# Patient Record
Sex: Female | Born: 1975
Health system: Southern US, Community
[De-identification: ages and names within clinical notes are randomized; demographics above are authoritative.]

## PROBLEM LIST (undated history)

## (undated) DIAGNOSIS — K219 Gastro-esophageal reflux disease without esophagitis: Secondary | ICD-10-CM

## (undated) DIAGNOSIS — D649 Anemia, unspecified: Secondary | ICD-10-CM

## (undated) DIAGNOSIS — E785 Hyperlipidemia, unspecified: Secondary | ICD-10-CM

## (undated) HISTORY — PX: TUBAL LIGATION: SHX77

## (undated) HISTORY — DX: Anemia, unspecified: D64.9

## (undated) HISTORY — DX: Hyperlipidemia, unspecified: E78.5

---

## 1999-01-22 ENCOUNTER — Other Ambulatory Visit: Admission: RE | Admit: 1999-01-22 | Discharge: 1999-01-22 | Payer: Self-pay | Admitting: Obstetrics

## 1999-02-15 ENCOUNTER — Other Ambulatory Visit: Admission: RE | Admit: 1999-02-15 | Discharge: 1999-02-15 | Payer: Self-pay | Admitting: Obstetrics and Gynecology

## 1999-05-12 ENCOUNTER — Inpatient Hospital Stay (HOSPITAL_COMMUNITY): Admission: AD | Admit: 1999-05-12 | Discharge: 1999-05-12 | Payer: Self-pay | Admitting: Obstetrics & Gynecology

## 1999-06-30 ENCOUNTER — Other Ambulatory Visit: Admission: RE | Admit: 1999-06-30 | Discharge: 1999-06-30 | Payer: Self-pay | Admitting: Gynecology

## 1999-08-09 DIAGNOSIS — D249 Benign neoplasm of unspecified breast: Secondary | ICD-10-CM | POA: Insufficient documentation

## 1999-09-26 ENCOUNTER — Inpatient Hospital Stay (HOSPITAL_COMMUNITY): Admission: AD | Admit: 1999-09-26 | Discharge: 1999-09-29 | Payer: Self-pay | Admitting: Obstetrics and Gynecology

## 1999-11-02 ENCOUNTER — Other Ambulatory Visit: Admission: RE | Admit: 1999-11-02 | Discharge: 1999-11-02 | Payer: Self-pay | Admitting: Gynecology

## 2000-02-01 ENCOUNTER — Ambulatory Visit (HOSPITAL_BASED_OUTPATIENT_CLINIC_OR_DEPARTMENT_OTHER): Admission: RE | Admit: 2000-02-01 | Discharge: 2000-02-01 | Payer: Self-pay | Admitting: *Deleted

## 2000-02-01 ENCOUNTER — Encounter (INDEPENDENT_AMBULATORY_CARE_PROVIDER_SITE_OTHER): Payer: Self-pay | Admitting: Specialist

## 2001-01-11 ENCOUNTER — Other Ambulatory Visit: Admission: RE | Admit: 2001-01-11 | Discharge: 2001-01-11 | Payer: Self-pay | Admitting: Obstetrics and Gynecology

## 2002-08-08 HISTORY — PX: BREAST CYST EXCISION: SHX579

## 2003-04-22 ENCOUNTER — Inpatient Hospital Stay (HOSPITAL_COMMUNITY): Admission: AD | Admit: 2003-04-22 | Discharge: 2003-04-23 | Payer: Self-pay | Admitting: *Deleted

## 2003-04-24 ENCOUNTER — Ambulatory Visit (HOSPITAL_COMMUNITY): Admission: RE | Admit: 2003-04-24 | Discharge: 2003-04-24 | Payer: Self-pay | Admitting: *Deleted

## 2003-05-19 ENCOUNTER — Ambulatory Visit (HOSPITAL_COMMUNITY): Admission: RE | Admit: 2003-05-19 | Discharge: 2003-05-19 | Payer: Self-pay | Admitting: *Deleted

## 2003-10-13 ENCOUNTER — Inpatient Hospital Stay (HOSPITAL_COMMUNITY): Admission: AD | Admit: 2003-10-13 | Discharge: 2003-10-13 | Payer: Self-pay | Admitting: *Deleted

## 2003-10-20 ENCOUNTER — Encounter: Admission: RE | Admit: 2003-10-20 | Discharge: 2003-10-20 | Payer: Self-pay | Admitting: *Deleted

## 2003-10-24 ENCOUNTER — Inpatient Hospital Stay (HOSPITAL_COMMUNITY): Admission: AD | Admit: 2003-10-24 | Discharge: 2003-10-26 | Payer: Self-pay | Admitting: Family Medicine

## 2004-02-24 ENCOUNTER — Emergency Department (HOSPITAL_COMMUNITY): Admission: EM | Admit: 2004-02-24 | Discharge: 2004-02-24 | Payer: Self-pay | Admitting: Emergency Medicine

## 2004-07-24 ENCOUNTER — Emergency Department (HOSPITAL_COMMUNITY): Admission: EM | Admit: 2004-07-24 | Discharge: 2004-07-24 | Payer: Self-pay | Admitting: Emergency Medicine

## 2005-01-11 ENCOUNTER — Other Ambulatory Visit: Admission: RE | Admit: 2005-01-11 | Discharge: 2005-01-11 | Payer: Self-pay | Admitting: Obstetrics and Gynecology

## 2005-05-06 ENCOUNTER — Ambulatory Visit (HOSPITAL_COMMUNITY): Admission: RE | Admit: 2005-05-06 | Discharge: 2005-05-06 | Payer: Self-pay | Admitting: Obstetrics and Gynecology

## 2005-12-20 ENCOUNTER — Inpatient Hospital Stay (HOSPITAL_COMMUNITY): Admission: AD | Admit: 2005-12-20 | Discharge: 2005-12-20 | Payer: Self-pay | Admitting: Obstetrics and Gynecology

## 2006-01-01 ENCOUNTER — Inpatient Hospital Stay (HOSPITAL_COMMUNITY): Admission: AD | Admit: 2006-01-01 | Discharge: 2006-01-03 | Payer: Self-pay | Admitting: Obstetrics and Gynecology

## 2006-01-02 ENCOUNTER — Encounter (INDEPENDENT_AMBULATORY_CARE_PROVIDER_SITE_OTHER): Payer: Self-pay | Admitting: *Deleted

## 2006-02-22 ENCOUNTER — Other Ambulatory Visit: Admission: RE | Admit: 2006-02-22 | Discharge: 2006-02-22 | Payer: Self-pay | Admitting: Obstetrics and Gynecology

## 2007-01-21 ENCOUNTER — Emergency Department (HOSPITAL_COMMUNITY): Admission: EM | Admit: 2007-01-21 | Discharge: 2007-01-21 | Payer: Self-pay | Admitting: *Deleted

## 2007-05-09 ENCOUNTER — Emergency Department (HOSPITAL_COMMUNITY): Admission: EM | Admit: 2007-05-09 | Discharge: 2007-05-09 | Payer: Self-pay | Admitting: Family Medicine

## 2007-05-21 ENCOUNTER — Emergency Department (HOSPITAL_COMMUNITY): Admission: EM | Admit: 2007-05-21 | Discharge: 2007-05-21 | Payer: Self-pay | Admitting: Emergency Medicine

## 2007-11-27 LAB — CONVERTED CEMR LAB: Pap Smear: NORMAL

## 2008-11-03 ENCOUNTER — Ambulatory Visit: Payer: Self-pay | Admitting: Internal Medicine

## 2008-11-03 DIAGNOSIS — R635 Abnormal weight gain: Secondary | ICD-10-CM | POA: Insufficient documentation

## 2008-11-03 DIAGNOSIS — E785 Hyperlipidemia, unspecified: Secondary | ICD-10-CM | POA: Insufficient documentation

## 2008-11-03 LAB — CONVERTED CEMR LAB
ALT: 13 units/L (ref 0–35)
AST: 16 units/L (ref 0–37)
Albumin: 3.7 g/dL (ref 3.5–5.2)
Alkaline Phosphatase: 41 units/L (ref 39–117)
BUN: 7 mg/dL (ref 6–23)
Basophils Absolute: 0 10*3/uL (ref 0.0–0.1)
Basophils Relative: 0.7 % (ref 0.0–3.0)
Bilirubin Urine: NEGATIVE
Bilirubin, Direct: 0 mg/dL (ref 0.0–0.3)
CO2: 27 meq/L (ref 19–32)
Calcium: 8.6 mg/dL (ref 8.4–10.5)
Chloride: 111 meq/L (ref 96–112)
Cholesterol, target level: 200 mg/dL
Cholesterol: 224 mg/dL — ABNORMAL HIGH (ref 0–200)
Creatinine, Ser: 0.8 mg/dL (ref 0.4–1.2)
Direct LDL: 146.4 mg/dL
Eosinophils Absolute: 0.1 10*3/uL (ref 0.0–0.7)
Eosinophils Relative: 2.2 % (ref 0.0–5.0)
GFR calc non Af Amer: 107.48 mL/min (ref >60)
Glucose, Bld: 88 mg/dL (ref 70–99)
HCT: 40.9 % (ref 36.0–46.0)
HDL goal, serum: 40 mg/dL
HDL: 61.3 mg/dL (ref >39.00)
Hemoglobin, Urine: NEGATIVE
Hemoglobin: 13.7 g/dL (ref 12.0–15.0)
Ketones, ur: NEGATIVE mg/dL
LDL Goal: 160 mg/dL
Lymphocytes Relative: 40.8 % (ref 12.0–46.0)
Lymphs Abs: 2.3 10*3/uL (ref 0.7–4.0)
MCHC: 33.4 g/dL (ref 30.0–36.0)
MCV: 89.9 fL (ref 78.0–100.0)
Monocytes Absolute: 0.4 10*3/uL (ref 0.1–1.0)
Monocytes Relative: 7.2 % (ref 3.0–12.0)
Neutro Abs: 2.8 10*3/uL (ref 1.4–7.7)
Neutrophils Relative %: 49.1 % (ref 43.0–77.0)
Nitrite: NEGATIVE
Platelets: 257 10*3/uL (ref 150.0–400.0)
Potassium: 3.9 meq/L (ref 3.5–5.1)
RBC: 4.55 M/uL (ref 3.87–5.11)
RDW: 13.3 % (ref 11.5–14.6)
Sodium: 142 meq/L (ref 135–145)
Specific Gravity, Urine: 1.02 (ref 1.000–1.030)
TSH: 1.07 microintl units/mL (ref 0.35–5.50)
Total Bilirubin: 0.5 mg/dL (ref 0.3–1.2)
Total CHOL/HDL Ratio: 4
Total Protein, Urine: NEGATIVE mg/dL
Total Protein: 7.3 g/dL (ref 6.0–8.3)
Triglycerides: 51 mg/dL (ref 0.0–149.0)
Urine Glucose: NEGATIVE mg/dL
Urobilinogen, UA: 0.2 (ref 0.0–1.0)
VLDL: 10.2 mg/dL (ref 0.0–40.0)
WBC: 5.6 10*3/uL (ref 4.5–10.5)
pH: 7 (ref 5.0–8.0)

## 2008-11-04 ENCOUNTER — Encounter: Payer: Self-pay | Admitting: Internal Medicine

## 2009-01-19 ENCOUNTER — Ambulatory Visit: Payer: Self-pay | Admitting: Internal Medicine

## 2009-01-19 DIAGNOSIS — J3089 Other allergic rhinitis: Secondary | ICD-10-CM | POA: Insufficient documentation

## 2009-01-19 DIAGNOSIS — H6980 Other specified disorders of Eustachian tube, unspecified ear: Secondary | ICD-10-CM | POA: Insufficient documentation

## 2010-04-26 ENCOUNTER — Telehealth: Payer: Self-pay | Admitting: Internal Medicine

## 2010-09-07 NOTE — Progress Notes (Signed)
Summary: Needs Appt with PCP  ---- Converted from flag ---- ---- 04/26/2010 9:26 AM, Verdell Face wrote: pt aware she needs appt and will call back in the future to set up appt.  ---- 04/24/2010 11:01 AM, Lanier Prude, CMA(AAMA) wrote: Please sched OV with PCP.    Thanks!!!! ------------------------------

## 2010-12-24 NOTE — Op Note (Signed)
NAMEIMO, CUMBIE                 ACCOUNT NO.:  [id number]   MEDICAL RECORD NO.:  [id number]          PATIENT TYPE:  INP   LOCATION:  9124                          [facility]ILITY:  [facility]   PHYSICIAN:  Hal Morales, M.D.DATE OF BIRTH:  1976-03-23   DATE OF PROCEDURE:  01/02/2006  DATE OF DISCHARGE:                                 OPERATIVE REPORT   PREOPERATIVE DIAGNOSIS:  Desire for surgical sterilization.   POSTOPERATIVE DIAGNOSIS:  Desire for surgical sterilization.   OPERATION:  Postpartum tubal sterilization.   SURGEON:  Vanessa P. Pennie Rushing, M.D.   ANESTHESIA:  Epidural.   ESTIMATED BLOOD LOSS:  Less than 10 cubic centimeters.   COMPLICATIONS:  None.   FINDINGS:  The tubes appeared normal for the postpartum state.   PREOPERATIVE DISCUSSION:  A discussion was held with the patient concerning  her desire for surgical sterilization.  The risks of anesthesia, bleeding,  infection, damage to adjacent organs, and possible subsequent pregnancy were  all discussed.  The patient confirmed that she wanted no further children  and was not interested in reversible contraception.   PROCEDURE:  The patient was taken to the operating room after appropriate  identification with her labor epidural in place.  She was placed on the  operating table in the supine position after having her labor epidural dosed  for surgical anesthesia.  The abdomen and perineum were prepped with  multiple layers of Betadine and the bladder emptied of 700 mL of clear urine  by in-and-out catheterization.  The abdomen was draped as a sterile field.  After the assurance of adequate anesthesia, the subumbilical area was then  injected with 10 cubic centimeters of 0.25% Marcaine.  A subumbilical  incision was made and the abdomen opened in layers.  The peritoneum was  entered.  The left fallopian tube was identified, followed to its fimbriated  end, then grasped at the isthmic portion and elevated.  A suture of  2-0  chromic was placed through the mesosalpinx and tied __________  knuckle of  tube.  A second ligature was placed proximal to that and the intervening  knuckle of tube excised.  The cut ends were cauterized and hemostasis noted  to be adequate.  A similar procedure was carried out on the opposite side.  The portions of tube removed from the operative field and sent to pathology.  The abdominal peritoneum was closed in a pursestring fashion with 0-Vicryl.  The fascia was closed in a running fashion with 0-Vicryl.  The skin incision  was closed with  a subcuticular suture of 3-0 Vicryl.  A sterile dressing was applied.  The  patient was taken from the operating room to the recovery room in  satisfactory condition having tolerated procedure well with sponge and  instrument counts correct.      Hal Morales, M.D.  Electronically Signed     VPH/MEDQ  D:  01/02/2006  T:  01/02/2006  Job:  [id number]

## 2010-12-24 NOTE — Discharge Summary (Signed)
NAMEBENISHA, Felicia Johnson                 ACCOUNT NO.:  [id number]   MEDICAL RECORD NO.:  [id number]          PATIENT TYPE:  INP   LOCATION:  9124                          [facility]  [facility]   PHYSICIAN:  Hal Morales, M.D.DATE OF BIRTH:  03-19-1976   DATE OF ADMISSION:  01/01/2006  DATE OF DISCHARGE:  01/03/2006                                 DISCHARGE SUMMARY   ADMISSION DIAGNOSES:  1.  Intrauterine pregnancy at  40 and 4/7 weeks.  2.  Active labor.   DISCHARGE DIAGNOSES:  1.  Intrauterine pregnancy at  40 and 4/7 weeks.  2.  Active labor.  3.  Status post a vaginal delivery of a female infant named Mikayla, Apgars      9 and 9, weighing 7 pounds 0 ounces.  4.  Desires elective sterilization.   HOSPITAL PROCEDURES:  1.  Epidural anesthesia.  2.  Pitocin augmentation of labor.  3.  Spontaneous vaginal delivery.  4.  Elective bilateral tubal ligation.   HOSPITAL COURSE:  The patient was admitted in active labor and proceeded  quickly to complete dilation.  She pushed for an hour and 20 minutes, and  got tired.  Pitocin was added to enhance labor and she proceeded to vaginal  delivery of a female infant, Mikayla, Apgars 9 and 9, weighing 7 pounds 0  ounces over an intact perineum with no complications.  EBL was 250 mL.  On  postpartum day #1, the patient had an elective postpartum tubal ligation,  which was performed under epidural anesthesia by Dr. Pennie Rushing without  complications.  Hemoglobin was 9.2 on that day.  On postpartum day #2, she  was ready to go home.  She was sore at her incision site, but otherwise  doing well.  Vital signs were stable.  The chest was clear.  Heart rate  regular rate and rhythm.  Abdomen was soft and appropriately tender.  Dressing was clean, dry and intact over umbilicus.  Lochia was within normal  limits.  Extremities within normal limits, and she was deemed to have  received the full benefit of her hospital stay and was discharged home.   DISCHARGE  MEDICATIONS:  1.  Motrin 600 mg p.o. q.6h. p.r.n.  2.  Tylox 1-2 p.o. q.4h. p.r.n.   DISCHARGE LABORATORY:  Hemoglobin 9.2, white blood cell count 14.3 and  platelet count 268.  RPR nonreactive.   DISCHARGE INSTRUCTIONS:  Per [facility] handout.   DISCHARGE FOLLOWUP:  In 6 weeks or p.r.n.      Marie L. Williams, C.N.M.      Hal Morales, M.D.  Electronically Signed    MLW/MEDQ  D:  01/03/2006  T:  01/03/2006  Job:  [id number]

## 2010-12-24 NOTE — Op Note (Signed)
[place]  Patient:    Felicia Johnson, Felicia Johnson                        MRN: [id number] Proc. Date: 02/01/00 Adm. Date:  [id number] Attending:  Kandis Mannan CC:         Donnie Coffin. Samuella Cota, M.D.             Dr. Conley Simmonds                           Operative Report  CCS# [id number]  PREOPERATIVE DIAGNOSIS:  Mass, right breast.  POSTOPERATIVE DIAGNOSIS:  Mass, right breast.  OPERATION PERFORMED:  Excision of mass, right breast.  SURGEON:  Donnie Coffin. Samuella Cota, M.D.  ANESTHESIA:  1% Xylocaine local with anesthesia monitoring, anesthesiologist and CRNA.  DESCRIPTION OF PROCEDURE:  The patient was taken to the operating room and placed on the table in supine position.  The right breast was prepped and draped in a sterile field.  The patient had a palpable mass in the right breast at the 10 oclock position.  A curved incision was outlined with a skin marker.  1% Xylocaine local was then used to infiltrate the skin and underlying breast tissue.  The curved incision was made and the palpable mass was felt.  The mass was quite deep in the breast.  Suture of 3-0 Vicryl was placed on the mass for traction.  The mass with some surrounding normal-appearing breast tissue was then removed with the cautery.  Bleeding was controlled with the cautery.  The wound was irrigated.  Subcutaneous tissues closed with interrupted sutures of 3-0 Vicryl and the skin was closed with a running subcuticular suture of 5-0 Vicryl.  Benzoin and half-inch Steri-Strips were used to reinforce the skin closure.  A pressure dressing using 4 x 4s, ABD and 4 inch Hypafix was applied.  The specimen was sent fresh but no frozen section requested.  The patient seemed to tolerate the procedure well and was taken to the PACU in satisfactory condition. DD:  02/01/00 TD:  02/02/00 Job: [id number]

## 2010-12-24 NOTE — H&P (Signed)
Felicia, Johnson                 ACCOUNT NO.:  [id number]   MEDICAL RECORD NO.:  [id number]          PATIENT TYPE:  INP   LOCATION:  9168                          [facility]  [facility]   PHYSICIAN:  Felicia Johnson, M.D.DATE OF BIRTH:  [birth year]   DATE OF ADMISSION:  01/01/2006  DATE OF DISCHARGE:                                HISTORY & PHYSICAL   Felicia Johnson is a 35 year old, gravida 3, para 2-0-0-2, who presented at 40-4/7  weeks with contractions increasing in frequency and intensity.  Her EDD is  by dates confirmed with a 6-week ultrasound. She was initially evaluated and  found to be 3-4 cm dilated with irregular contractions.  She walked x1 hour,  and her cervical exam increased to 4-5 cm.  She is, therefore, admitted in  early labor.  Her pregnancy has been followed by the C.N.M. service at  [facility] and is remarkable for  1.  Latex sensitivity.  2.  Desires BTL.  3.  Group B strep negative.   This patient began prenatal care at the office of [facility] on May 19, 2005, at approximately [redacted] weeks gestation, [facility]  determined by dates and confirmed with ultrasound at 6 weeks. Her pregnancy  has been essentially unremarkable.  She has complained of back pain and was  taking ibuprofen. At 36 weeks, she did have fetal echocardiogram due to  NSAID use, and this was found to be within normal limits.  She had a 1-hour  elevated Glucola, and her 3-hour GTT was within normal limits.  Otherwise  she has been size equal to dates throughout, normotensive with no  proteinuria.   OB HISTORY:  In 2001, the patient had a normal spontaneous vaginal delivery  at term with the birth of a 6 pounds 3 ounce female infant named Felicia Johnson  with no complications. In 2005, the patient had a normal spontaneous vaginal  delivery with the birth of a 6 pound 12 ounce female named Felicia Johnson with no  complications. This is her third and current pregnancy.  She has a LATEX  sensitivity, no medication allergies. She denies the use of tobacco, alcohol  or illicit drugs.   PRENATAL LAB WORK:  On June 02, 2005, hemoglobin and hematocrit 13 and  39.3, respectively, platelets 306,000.  Blood type and Rh O+, antibody  screen negative, sickle cell trait negative, VDRL nonreactive, rubella  immune, hepatitis B surface antigen negative, HIV nonreactive.  Pap smear  within normal limits.  GC and chlamydia negative.  CF testing negative.  At  28 weeks, one-hour glucose challenge elevated, 3-hour GTT within normal  limits.  At 36 weeks, culture of the vaginal tract is negative for group B  strep, GC, and chlamydia.   MEDICAL HISTORY:  Is unremarkable.   SURGICAL HISTORY:  None.   FAMILY HISTORY:  Maternal grandmother and maternal grandfather with a  history of chronic hypertension.  The patient's mother has depression.   GENETIC HISTORY:  There is no family history of familial or chromosomal  disorders, children that were born with birth defects, or any  that died in  infancy.   SOCIAL HISTORY:  Felicia Johnson is a 35 year old African-American female.  Her  husband [name], is involved and supportive.  She works as a Probation officer.  He is an Chief Strategy Officer. They are Christian in their  faith.   REVIEW OF SYSTEMS:  Is as described above.  The patient is typical one with  the uterine pregnancy at term in early active labor.   PHYSICAL EXAMINATION:  VITAL SIGNS: Stable.  Temperature 98.2, pulse 101,  respirations 20, blood pressure 106/71.  HEENT: Is unremarkable.  HEART: Regular rate and rhythm.  LUNGS: Clear.  ABDOMEN: Is gravid in its contour.  It is soft and nontender in between  contractions. It extends 39 cm above the level of the pubic symphysis.  Leopold's maneuver finds the infant in longitudinal lie, cephalic  presentation, and the estimated fetal weight of 6-1/2 pounds. Baseline of  the fetal heart rate monitor is 140s with long-term  variability. Reactivity  is present with no periodic changes.  The patient is contracting every 4-5  minutes.  Digital exam of the cervix on admission was 3-4 cm dilated, 70%  effaced with a cephalic presenting part at -2 station and membranes intact.  One hour later, cervical exam is 4-5 cm, 80% effaced with a cephalic  presenting part at -2 station and membranes intact.  EXTREMITIES: Showed no pathologic edema.  DTRs were 1+ with no clonus.  There is no calf tenderness bilaterally.   ASSESSMENT:  1.  Intrauterine pregnancy at term.  2.  Early labor.   PLAN:  1.  Admit per Dr. Dierdre Forth.  2.  Routine C.N.M. orders.  3.  May have epidural.      Felicia Johnson, C.N.M.      Felicia Johnson, M.D.  Electronically Signed    SDM/MEDQ  D:  01/01/2006  T:  01/01/2006  Job:  [id number]

## 2011-04-04 ENCOUNTER — Encounter: Payer: Self-pay | Admitting: Internal Medicine

## 2011-04-07 ENCOUNTER — Other Ambulatory Visit: Payer: Self-pay | Admitting: Obstetrics and Gynecology

## 2011-05-12 ENCOUNTER — Encounter: Payer: Self-pay | Admitting: Internal Medicine

## 2011-05-18 ENCOUNTER — Encounter: Payer: Self-pay | Admitting: Internal Medicine

## 2011-05-18 DIAGNOSIS — Z0289 Encounter for other administrative examinations: Secondary | ICD-10-CM

## 2011-05-19 LAB — POCT URINALYSIS DIP (DEVICE)
Glucose, UA: NEGATIVE
Glucose, UA: NEGATIVE
Ketones, ur: 15 — AB
Nitrite: POSITIVE — AB
Specific Gravity, Urine: 1.02
Specific Gravity, Urine: >=1.03
Urobilinogen, UA: 0.2
pH: 5
pH: 6

## 2011-05-19 LAB — POCT PREGNANCY, URINE: Operator id: 239701

## 2011-05-19 LAB — URINE CULTURE: Colony Count: NO GROWTH

## 2011-10-25 ENCOUNTER — Encounter (HOSPITAL_COMMUNITY): Payer: Self-pay | Admitting: Emergency Medicine

## 2011-10-25 ENCOUNTER — Emergency Department (HOSPITAL_COMMUNITY)
Admission: EM | Admit: 2011-10-25 | Discharge: 2011-10-25 | Disposition: A | Discharge status: Home / Self Care | Payer: 59 | Attending: Emergency Medicine | Admitting: Emergency Medicine

## 2011-10-25 DIAGNOSIS — M549 Dorsalgia, unspecified: Secondary | ICD-10-CM | POA: Insufficient documentation

## 2011-10-25 DIAGNOSIS — E785 Hyperlipidemia, unspecified: Secondary | ICD-10-CM | POA: Insufficient documentation

## 2011-10-25 DIAGNOSIS — K219 Gastro-esophageal reflux disease without esophagitis: Secondary | ICD-10-CM

## 2011-10-25 DIAGNOSIS — R079 Chest pain, unspecified: Secondary | ICD-10-CM | POA: Insufficient documentation

## 2011-10-25 MED ORDER — OMEPRAZOLE 20 MG PO CPDR: 20.0000 mg | DELAYED_RELEASE_CAPSULE | Freq: Every day | ORAL | Status: AC

## 2011-10-25 MED ORDER — DIAZEPAM 5 MG PO TABS: 5.0000 mg | ORAL_TABLET | Freq: Two times a day (BID) | ORAL | Status: AC

## 2011-10-25 NOTE — ED Notes (Signed)
Pt states that for several months now she has intermit burning sensation to chest area and that it is worse when she lays flat. States it is more of a burning feeling than a pain or pressure. No sob, dry skin,

## 2011-10-25 NOTE — ED Provider Notes (Signed)
History     CSN: [id number]  Arrival date & time 10/25/11  1113   First MD Initiated Contact with Patient 10/25/11 1334      Chief Complaint  Patient presents with  . Pleurisy    (Consider location/radiation/quality/duration/timing/severity/associated sxs/prior treatment) HPI Comments: Patient reports that she has been having an intermittent burning sensation in her chest for the last couple of weeks.  She reports that the pain is worse after eating and when she lays down.  The pain came on again this morning after eating [place].  She denies prior history of GERD.  Pain does not radiate.  Pain not associated with SOB, nausea, vomiting, diaphoresis, or numbness/tingling.  She reports that she has had similar pain in the past.   Patient denies prior cardiac history.  No family history of premature coronary artery disease. Patient is not on any estrogen containing medications, no prolonged travel in the past 4 weeks, no surgery in the past 4 weeks, no swelling of lower extremities.   Patient also reports pain in her upper back for the last couple of weeks.  She reports that it feels like muscle soreness.  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  Pain is worse with twisting and bending.  No numbness/tingling.  No loss of bowel or bladder incontinence.  No fever.    The history is provided by the patient.    Past Medical History  Diagnosis Date  . Hyperlipidemia     History reviewed. No pertinent past surgical history.  Family History  Problem Relation Age of Onset  . Stroke Other     History  Substance Use Topics  . Smoking status: Never Smoker   . Smokeless tobacco: Not on file  . Alcohol Use: No    OB History    Grav Para Term Preterm Abortions TAB SAB Ect Mult Living                  Review of Systems  Constitutional: Negative for fever and chills.  HENT: Negative for neck pain and neck stiffness.   Respiratory: Negative for  cough, shortness of breath and wheezing.   Cardiovascular: Negative for leg swelling.  Gastrointestinal: Negative for nausea and vomiting.  Genitourinary: Negative for dysuria, hematuria and decreased urine volume.  Musculoskeletal: Negative for gait problem.  Skin: Negative for rash.  Neurological: Negative for dizziness, syncope and light-headedness.    Allergies  Review of patient's allergies indicates no known allergies.  Home Medications   Current Outpatient Rx  Name Route Sig Dispense Refill  . FEXOFENADINE HCL 180 MG PO TABS Oral Take 180 mg by mouth daily.      . TRIAMCINOLONE ACETONIDE 55 MCG/ACT NA INHA Nasal Place 2 sprays into the nose daily.        BP 124/64  Pulse 72  Temp 98.1 F (36.7 C)  Resp 20  SpO2 100%  Physical Exam  Nursing note and vitals reviewed. Constitutional: She is oriented to person, place, and time. She appears well-developed and well-nourished.  HENT:  Head: Normocephalic.  Mouth/Throat: Oropharynx is clear and moist.  Neck: Normal range of motion. Neck supple.  Cardiovascular: Normal rate, regular rhythm and normal heart sounds.   Pulmonary/Chest: Effort normal and breath sounds normal. No respiratory distress. She has no wheezes. She exhibits no tenderness, no bony tenderness and no deformity.  Abdominal: Soft. There is no tenderness.  Musculoskeletal: Normal range of motion. She exhibits  no edema and no tenderness.  Neurological: She is alert and oriented to person, place, and time. She has normal strength and normal reflexes. No sensory deficit. Gait normal.  Skin: Skin is warm and dry. No rash noted.  Psychiatric: She has a normal mood and affect.    ED Course  Procedures (including critical care time)  Labs Reviewed - No data to display No results found.   No diagnosis found.    MDM  Patient describing intermittent "burning" sensation in her chest over the past two weeks.  Pain worse when laying down and after eating,  especially spicy foods.  Feel that patient's symptoms most likely GERD.  Patient instructed to start taking Prilosec and to return if chest pain worsens,  becomes exertional, associated with SOB, diaphoresis, nausea, or vomiting.  Feel that back pain is most likely muscular.  No trauma. No history of cancer or IVDU.  Normal neurological exam.  Patient given Rx for muscle relaxer.  Patient in agreement with plan.       Pascal Lux Bostic, PA-C 10/25/11 2336

## 2011-10-25 NOTE — Discharge Instructions (Signed)
Read instructions below for reasons to return to the Emergency Department. It is recommended that your follow up with your Primary Care Doctor in regards to today's visit. If you do not have a doctor, use the resource guide listed below to help you find one. Begin taking over the counter Prilosec or Zegrid as directed.   Chest Pain (Nonspecific)  HOME CARE INSTRUCTIONS  For the next few days, avoid physical activities that bring on chest pain. Continue physical activities as directed.  Do not smoke cigarettes or drink alcohol until your symptoms are gone.  Only take over-the-counter or prescription medicine for pain, discomfort, or fever as directed by your caregiver.  Follow your caregiver's suggestions for further testing if your chest pain does not go away.  Keep any follow-up appointments you made. If you do not go to an appointment, you could develop lasting (chronic) problems with pain. If there is any problem keeping an appointment, you must call to reschedule.  SEEK MEDICAL CARE IF:  You think you are having problems from the medicine you are taking. Read your medicine instructions carefully.  Your chest pain does not go away, even after treatment.  You develop a rash with blisters on your chest.  SEEK IMMEDIATE MEDICAL CARE IF:  You have increased chest pain or pain that spreads to your arm, neck, jaw, back, or belly (abdomen).  You develop shortness of breath, an increasing cough, or you are coughing up blood.  You have severe back or abdominal pain, feel sick to your stomach (nauseous) or throw up (vomit).  You develop severe weakness, fainting, or chills.  You have an oral temperature above 102 F (38.9 C), not controlled by medicine.   THIS IS AN EMERGENCY. Do not wait to see if the pain will go away. Get medical help at once. Call your local emergency services (911 in [place] Do not drive yourself to the hospital.   RESOURCE GUIDE  Dental Problems  Patients with  Medicaid: [facility]                     [facility] [address]                                           [address] Phone:  [phone]                                                  Phone:  [phone]  If unable to pay or uninsured, contact:  [facility] or [facility] to become qualified for the adult dental clinic.  Chronic Pain Problems Contact  [facility]  [phone] Patients need to be referred by their primary care doctor.  Insufficient Money for Medicine Contact [employer]:  call "211" or [facility] 271-5999.  No Primary Care Doctor Call [facility]  832-8000 Other agencies that provide inexpensive medical care    [facility]  832-8035    [facility]  832-7272    [facility]  [phone]    Women's Clinic  832-4777    [facility]  [phone]    [facility]  272-1050  Psychological Services   [facility]  832-9600 [facility]  [phone] [facility]   800 853-5163 (emergency services 641-4993)  Substance Abuse Resources [facility]  336-882-2125 [facility] 336-784-9470 [facility] 336-285-9073 [facility] 336-845-3988 Residential & Outpatient Substance Abuse Program  800-659-3381  Abuse/Neglect [place] Child Abuse Hotline (336) 641-3795 [place] Child Abuse Hotline 800-378-5315 (After Hours)  Emergency Shelter [facility] (336) 271-5985  [facility] (336) 275-9566 [facility] (704) 372-4663  MRSA Hotline #:   832-7006    [place] Resources  [facility] [place]     [employer]                          [facility] [address] [place]                       [address]      [address]                                                   [place]                            [place] Phone:  [phone]                                   Phone:  [phone]                 Phone:  [phone]  [facility] Phone:  342-8316  [place] Child Abuse Hotline (336) 342-1394 (336) 342-3537 (After Hours)   

## 2011-10-26 NOTE — ED Provider Notes (Signed)
Medical screening examination/treatment/procedure(s) were performed by non-physician practitioner and as supervising physician I was immediately available for consultation/collaboration.  Flint Melter, MD 10/26/11 1259

## 2011-10-31 ENCOUNTER — Other Ambulatory Visit (INDEPENDENT_AMBULATORY_CARE_PROVIDER_SITE_OTHER): Payer: 59

## 2011-10-31 ENCOUNTER — Ambulatory Visit (INDEPENDENT_AMBULATORY_CARE_PROVIDER_SITE_OTHER): Payer: 59 | Admitting: Internal Medicine

## 2011-10-31 ENCOUNTER — Encounter: Payer: Self-pay | Admitting: Internal Medicine

## 2011-10-31 VITALS — BP 110/68 | HR 71 | Temp 98.2°F | Resp 16 | Wt 157.0 lb

## 2011-10-31 DIAGNOSIS — Z0000 Encounter for general adult medical examination without abnormal findings: Secondary | ICD-10-CM | POA: Insufficient documentation

## 2011-10-31 DIAGNOSIS — J3089 Other allergic rhinitis: Secondary | ICD-10-CM

## 2011-10-31 DIAGNOSIS — E785 Hyperlipidemia, unspecified: Secondary | ICD-10-CM

## 2011-10-31 LAB — COMPREHENSIVE METABOLIC PANEL
Albumin: 3.8 g/dL (ref 3.5–5.2)
CO2: 26 mEq/L (ref 19–32)
Calcium: 8.9 mg/dL (ref 8.4–10.5)
Chloride: 108 mEq/L (ref 96–112)
GFR: 98.37 mL/min (ref >60.00)
Glucose, Bld: 85 mg/dL (ref 70–99)
Potassium: 4.1 mEq/L (ref 3.5–5.1)
Sodium: 140 mEq/L (ref 135–145)
Total Protein: 7.5 g/dL (ref 6.0–8.3)

## 2011-10-31 LAB — CBC WITH DIFFERENTIAL/PLATELET
Eosinophils Relative: 3.5 % (ref 0.0–5.0)
Monocytes Relative: 10.5 % (ref 3.0–12.0)
Neutrophils Relative %: 45.3 % (ref 43.0–77.0)
Platelets: 230 10*3/uL (ref 150.0–400.0)
RBC: 4.31 Mil/uL (ref 3.87–5.11)
WBC: 5.5 10*3/uL (ref 4.5–10.5)

## 2011-10-31 LAB — TSH: TSH: 1.45 u[IU]/mL (ref 0.35–5.50)

## 2011-10-31 LAB — LIPID PANEL: VLDL: 8.4 mg/dL (ref 0.0–40.0)

## 2011-10-31 NOTE — Patient Instructions (Signed)
Preventive Care for Adults, Female A healthy lifestyle and preventive care can promote health and wellness. Preventive health guidelines for women include the following key practices.  A routine yearly physical is a good way to check with your caregiver about your health and preventive screening. It is a chance to share any concerns and updates on your health, and to receive a thorough exam.   Visit your dentist for a routine exam and preventive care every 6 months. Brush your teeth twice a day and floss once a day. Good oral hygiene prevents tooth decay and gum disease.   The frequency of eye exams is based on your age, health, family medical history, use of contact lenses, and other factors. Follow your caregiver's recommendations for frequency of eye exams.   Eat a healthy diet. Foods like vegetables, fruits, whole grains, low-fat dairy products, and lean protein foods contain the nutrients you need without too many calories. Decrease your intake of foods high in solid fats, added sugars, and salt. Eat the right amount of calories for you.Get information about a proper diet from your caregiver, if necessary.   Regular physical exercise is one of the most important things you can do for your health. Most adults should get at least 150 minutes of moderate-intensity exercise (any activity that increases your heart rate and causes you to sweat) each week. In addition, most adults need muscle-strengthening exercises on 2 or more days a week.   Maintain a healthy weight. The body mass index (BMI) is a screening tool to identify possible weight problems. It provides an estimate of body fat based on height and weight. Your caregiver can help determine your BMI, and can help you achieve or maintain a healthy weight.For adults 20 years and older:   A BMI below 18.5 is considered underweight.   A BMI of 18.5 to 24.9 is normal.   A BMI of 25 to 29.9 is considered overweight.   A BMI of 30 and above is  considered obese.   Maintain normal blood lipids and cholesterol levels by exercising and minimizing your intake of saturated fat. Eat a balanced diet with plenty of fruit and vegetables. Blood tests for lipids and cholesterol should begin at age 20 and be repeated every 5 years. If your lipid or cholesterol levels are high, you are over 50, or you are at high risk for heart disease, you may need your cholesterol levels checked more frequently.Ongoing high lipid and cholesterol levels should be treated with medicines if diet and exercise are not effective.   If you smoke, find out from your caregiver how to quit. If you do not use tobacco, do not start.   If you are pregnant, do not drink alcohol. If you are breastfeeding, be very cautious about drinking alcohol. If you are not pregnant and choose to drink alcohol, do not exceed 1 drink per day. One drink is considered to be 12 ounces (355 mL) of beer, 5 ounces (148 mL) of wine, or 1.5 ounces (44 mL) of liquor.   Avoid use of street drugs. Do not share needles with anyone. Ask for help if you need support or instructions about stopping the use of drugs.   High blood pressure causes heart disease and increases the risk of stroke. Your blood pressure should be checked at least every 1 to 2 years. Ongoing high blood pressure should be treated with medicines if weight loss and exercise are not effective.   If you are 55 to 34   years old, ask your caregiver if you should take aspirin to prevent strokes.   Diabetes screening involves taking a blood sample to check your fasting blood sugar level. This should be done once every 3 years, after age 45, if you are within normal weight and without risk factors for diabetes. Testing should be considered at a younger age or be carried out more frequently if you are overweight and have at least 1 risk factor for diabetes.   Breast cancer screening is essential preventive care for women. You should practice "breast  self-awareness." This means understanding the normal appearance and feel of your breasts and may include breast self-examination. Any changes detected, no matter how small, should be reported to a caregiver. Women in their 20s and 30s should have a clinical breast exam (CBE) by a caregiver as part of a regular health exam every 1 to 3 years. After age 40, women should have a CBE every year. Starting at age 40, women should consider having a mammography (breast X-ray test) every year. Women who have a family history of breast cancer should talk to their caregiver about genetic screening. Women at a high risk of breast cancer should talk to their caregivers about having magnetic resonance imaging (MRI) and a mammography every year.   The Pap test is a screening test for cervical cancer. A Pap test can show cell changes on the cervix that might become cervical cancer if left untreated. A Pap test is a procedure in which cells are obtained and examined from the lower end of the uterus (cervix).   Women should have a Pap test starting at age 21.   Between ages 21 and 29, Pap tests should be repeated every 2 years.   Beginning at age 30, you should have a Pap test every 3 years as long as the past 3 Pap tests have been normal.   Some women have medical problems that increase the chance of getting cervical cancer. Talk to your caregiver about these problems. It is especially important to talk to your caregiver if a new problem develops soon after your last Pap test. In these cases, your caregiver may recommend more frequent screening and Pap tests.   The above recommendations are the same for women who have or have not gotten the vaccine for human papillomavirus (HPV).   If you had a hysterectomy for a problem that was not cancer or a condition that could lead to cancer, then you no longer need Pap tests. Even if you no longer need a Pap test, a regular exam is a good idea to make sure no other problems are  starting.   If you are between ages 65 and 70, and you have had normal Pap tests going back 10 years, you no longer need Pap tests. Even if you no longer need a Pap test, a regular exam is a good idea to make sure no other problems are starting.   If you have had past treatment for cervical cancer or a condition that could lead to cancer, you need Pap tests and screening for cancer for at least 20 years after your treatment.   If Pap tests have been discontinued, risk factors (such as a new sexual partner) need to be reassessed to determine if screening should be resumed.   The HPV test is an additional test that may be used for cervical cancer screening. The HPV test looks for the virus that can cause the cell changes on the cervix.   The cells collected during the Pap test can be tested for HPV. The HPV test could be used to screen women aged 30 years and older, and should be used in women of any age who have unclear Pap test results. After the age of 30, women should have HPV testing at the same frequency as a Pap test.   Colorectal cancer can be detected and often prevented. Most routine colorectal cancer screening begins at the age of 50 and continues through age 75. However, your caregiver may recommend screening at an earlier age if you have risk factors for colon cancer. On a yearly basis, your caregiver may provide home test kits to check for hidden blood in the stool. Use of a small camera at the end of a tube, to directly examine the colon (sigmoidoscopy or colonoscopy), can detect the earliest forms of colorectal cancer. Talk to your caregiver about this at age 50, when routine screening begins. Direct examination of the colon should be repeated every 5 to 10 years through age 75, unless early forms of pre-cancerous polyps or small growths are found.   Hepatitis C blood testing is recommended for all people born from 1945 through 1965 and any individual with known risks for hepatitis C.    Practice safe sex. Use condoms and avoid high-risk sexual practices to reduce the spread of sexually transmitted infections (STIs). STIs include gonorrhea, chlamydia, syphilis, trichomonas, herpes, HPV, and human immunodeficiency virus (HIV). Herpes, HIV, and HPV are viral illnesses that have no cure. They can result in disability, cancer, and death. Sexually active women aged 25 and younger should be checked for chlamydia. Older women with new or multiple partners should also be tested for chlamydia. Testing for other STIs is recommended if you are sexually active and at increased risk.   Osteoporosis is a disease in which the bones lose minerals and strength with aging. This can result in serious bone fractures. The risk of osteoporosis can be identified using a bone density scan. Women ages 65 and over and women at risk for fractures or osteoporosis should discuss screening with their caregivers. Ask your caregiver whether you should take a calcium supplement or vitamin D to reduce the rate of osteoporosis.   Menopause can be associated with physical symptoms and risks. Hormone replacement therapy is available to decrease symptoms and risks. You should talk to your caregiver about whether hormone replacement therapy is right for you.   Use sunscreen with sun protection factor (SPF) of 30 or more. Apply sunscreen liberally and repeatedly throughout the day. You should seek shade when your shadow is shorter than you. Protect yourself by wearing long sleeves, pants, a wide-brimmed hat, and sunglasses year round, whenever you are outdoors.   Once a month, do a whole body skin exam, using a mirror to look at the skin on your back. Notify your caregiver of new moles, moles that have irregular borders, moles that are larger than a pencil eraser, or moles that have changed in shape or color.   Stay current with required immunizations.   Influenza. You need a dose every fall (or winter). The composition of  the flu vaccine changes each year, so being vaccinated once is not enough.   Pneumococcal polysaccharide. You need 1 to 2 doses if you smoke cigarettes or if you have certain chronic medical conditions. You need 1 dose at age 65 (or older) if you have never been vaccinated.   Tetanus, diphtheria, pertussis (Tdap, Td). Get 1 dose of   Tdap vaccine if you are younger than age 65, are over 65 and have contact with an infant, are a healthcare worker, are pregnant, or simply want to be protected from whooping cough. After that, you need a Td booster dose every 10 years. Consult your caregiver if you have not had at least 3 tetanus and diphtheria-containing shots sometime in your life or have a deep or dirty wound.   HPV. You need this vaccine if you are a woman age 26 or younger. The vaccine is given in 3 doses over 6 months.   Measles, mumps, rubella (MMR). You need at least 1 dose of MMR if you were born in 1957 or later. You may also need a second dose.   Meningococcal. If you are age 19 to 21 and a first-year college student living in a residence [place], or have one of several medical conditions, you need to get vaccinated against meningococcal disease. You may also need additional booster doses.   Zoster (shingles). If you are age 60 or older, you should get this vaccine.   Varicella (chickenpox). If you have never had chickenpox or you were vaccinated but received only 1 dose, talk to your caregiver to find out if you need this vaccine.   Hepatitis A. You need this vaccine if you have a specific risk factor for hepatitis A virus infection or you simply wish to be protected from this disease. The vaccine is usually given as 2 doses, 6 to 18 months apart.   Hepatitis B. You need this vaccine if you have a specific risk factor for hepatitis B virus infection or you simply wish to be protected from this disease. The vaccine is given in 3 doses, usually over 6 months.  Preventive Services /  Frequency Ages 19 to 39  Blood pressure check.** / Every 1 to 2 years.   Lipid and cholesterol check.** / Every 5 years beginning at age 20.   Clinical breast exam.** / Every 3 years for women in their 20s and 30s.   Pap test.** / Every 2 years from ages 21 through 29. Every 3 years starting at age 30 through age 65 or 70 with a history of 3 consecutive normal Pap tests.   HPV screening.** / Every 3 years from ages 30 through ages 65 to 70 with a history of 3 consecutive normal Pap tests.   Hepatitis C blood test.** / For any individual with known risks for hepatitis C.   Skin self-exam. / Monthly.   Influenza immunization.** / Every year.   Pneumococcal polysaccharide immunization.** / 1 to 2 doses if you smoke cigarettes or if you have certain chronic medical conditions.   Tetanus, diphtheria, pertussis (Tdap, Td) immunization. / A one-time dose of Tdap vaccine. After that, you need a Td booster dose every 10 years.   HPV immunization. / 3 doses over 6 months, if you are 26 and younger.   Measles, mumps, rubella (MMR) immunization. / You need at least 1 dose of MMR if you were born in 1957 or later. You may also need a second dose.   Meningococcal immunization. / 1 dose if you are age 19 to 21 and a first-year college student living in a residence [place], or have one of several medical conditions, you need to get vaccinated against meningococcal disease. You may also need additional booster doses.   Varicella immunization.** / Consult your caregiver.   Hepatitis A immunization.** / Consult your caregiver. 2 doses, 6 to 18 months   apart.   Hepatitis B immunization.** / Consult your caregiver. 3 doses usually over 6 months.  Ages 40 to 64  Blood pressure check.** / Every 1 to 2 years.   Lipid and cholesterol check.** / Every 5 years beginning at age 20.   Clinical breast exam.** / Every year after age 40.   Mammogram.** / Every year beginning at age 40 and continuing for as  long as you are in good health. Consult with your caregiver.   Pap test.** / Every 3 years starting at age 30 through age 65 or 70 with a history of 3 consecutive normal Pap tests.   HPV screening.** / Every 3 years from ages 30 through ages 65 to 70 with a history of 3 consecutive normal Pap tests.   Fecal occult blood test (FOBT) of stool. / Every year beginning at age 50 and continuing until age 75. You may not need to do this test if you get a colonoscopy every 10 years.   Flexible sigmoidoscopy or colonoscopy.** / Every 5 years for a flexible sigmoidoscopy or every 10 years for a colonoscopy beginning at age 50 and continuing until age 75.   Hepatitis C blood test.** / For all people born from 1945 through 1965 and any individual with known risks for hepatitis C.   Skin self-exam. / Monthly.   Influenza immunization.** / Every year.   Pneumococcal polysaccharide immunization.** / 1 to 2 doses if you smoke cigarettes or if you have certain chronic medical conditions.   Tetanus, diphtheria, pertussis (Tdap, Td) immunization.** / A one-time dose of Tdap vaccine. After that, you need a Td booster dose every 10 years.   Measles, mumps, rubella (MMR) immunization. / You need at least 1 dose of MMR if you were born in 1957 or later. You may also need a second dose.   Varicella immunization.** / Consult your caregiver.   Meningococcal immunization.** / Consult your caregiver.   Hepatitis A immunization.** / Consult your caregiver. 2 doses, 6 to 18 months apart.   Hepatitis B immunization.** / Consult your caregiver. 3 doses, usually over 6 months.  Ages 65 and over  Blood pressure check.** / Every 1 to 2 years.   Lipid and cholesterol check.** / Every 5 years beginning at age 20.   Clinical breast exam.** / Every year after age 40.   Mammogram.** / Every year beginning at age 40 and continuing for as long as you are in good health. Consult with your caregiver.   Pap test.** /  Every 3 years starting at age 30 through age 65 or 70 with a 3 consecutive normal Pap tests. Testing can be stopped between 65 and 70 with 3 consecutive normal Pap tests and no abnormal Pap or HPV tests in the past 10 years.   HPV screening.** / Every 3 years from ages 30 through ages 65 or 70 with a history of 3 consecutive normal Pap tests. Testing can be stopped between 65 and 70 with 3 consecutive normal Pap tests and no abnormal Pap or HPV tests in the past 10 years.   Fecal occult blood test (FOBT) of stool. / Every year beginning at age 50 and continuing until age 75. You may not need to do this test if you get a colonoscopy every 10 years.   Flexible sigmoidoscopy or colonoscopy.** / Every 5 years for a flexible sigmoidoscopy or every 10 years for a colonoscopy beginning at age 50 and continuing until age 75.   Hepatitis   C blood test.** / For all people born from 1945 through 1965 and any individual with known risks for hepatitis C.   Osteoporosis screening.** / A one-time screening for women ages 65 and over and women at risk for fractures or osteoporosis.   Skin self-exam. / Monthly.   Influenza immunization.** / Every year.   Pneumococcal polysaccharide immunization.** / 1 dose at age 65 (or older) if you have never been vaccinated.   Tetanus, diphtheria, pertussis (Tdap, Td) immunization. / A one-time dose of Tdap vaccine if you are over 65 and have contact with an infant, are a healthcare worker, or simply want to be protected from whooping cough. After that, you need a Td booster dose every 10 years.   Varicella immunization.** / Consult your caregiver.   Meningococcal immunization.** / Consult your caregiver.   Hepatitis A immunization.** / Consult your caregiver. 2 doses, 6 to 18 months apart.   Hepatitis B immunization.** / Check with your caregiver. 3 doses, usually over 6 months.  ** Family history and personal history of risk and conditions may change your caregiver's  recommendations. Document Released: 09/20/2001 Document Revised: 07/14/2011 Document Reviewed: 12/20/2010 [employer] Patient Information 2012 [employer]. 

## 2011-10-31 NOTE — Progress Notes (Signed)
Subjective:    Patient ID: Felicia Johnson, female    DOB: 12/09/1975, 36 y.o.   MRN: [id number]  HPI She returns for a physical but also complains that she has been out of her allergy meds for several weeks and feels like she is having a flare up of her nasal allergy symptoms.   Review of Systems  Constitutional: Negative for fever, chills, diaphoresis, activity change, appetite change, fatigue and unexpected weight change.  HENT: Positive for congestion, rhinorrhea, sneezing and postnasal drip. Negative for hearing loss, ear pain, nosebleeds, sore throat, facial swelling, drooling, mouth sores, trouble swallowing, neck pain, neck stiffness, dental problem, voice change, sinus pressure, tinnitus and ear discharge.   Eyes: Negative.   Respiratory: Negative.   Cardiovascular: Negative.   Gastrointestinal: Negative.   Genitourinary: Negative.   Musculoskeletal: Negative.   Skin: Negative.   Neurological: Negative.   Hematological: Negative.   Psychiatric/Behavioral: Negative.        Objective:   Physical Exam  Vitals reviewed. Constitutional: She is oriented to person, place, and time. She appears well-developed and well-nourished. No distress.  HENT:  Head: Normocephalic and atraumatic. No trismus in the jaw.  Right Ear: Hearing, tympanic membrane, external ear and ear canal normal.  Left Ear: Hearing, tympanic membrane, external ear and ear canal normal.  Nose: Mucosal edema and rhinorrhea present. No nose lacerations, sinus tenderness, nasal deformity, septal deviation or nasal septal hematoma. No epistaxis.  No foreign bodies. Right sinus exhibits no maxillary sinus tenderness and no frontal sinus tenderness. Left sinus exhibits no maxillary sinus tenderness and no frontal sinus tenderness.  Mouth/Throat: Oropharynx is clear and moist and mucous membranes are normal. Mucous membranes are not pale, not dry and not cyanotic. No oral lesions. No uvula swelling. No oropharyngeal exudate,  posterior oropharyngeal edema, posterior oropharyngeal erythema or tonsillar abscesses.  Eyes: Conjunctivae are normal. Right eye exhibits no discharge. Left eye exhibits no discharge. No scleral icterus.  Neck: Normal range of motion. Neck supple. No JVD present. No tracheal deviation present. No thyromegaly present.  Cardiovascular: Normal rate, regular rhythm, normal heart sounds and intact distal pulses.  Exam reveals no gallop and no friction rub.   No murmur heard. Pulmonary/Chest: Effort normal and breath sounds normal. No stridor. No respiratory distress. She has no wheezes. She has no rales. She exhibits no tenderness.  Abdominal: Soft. Bowel sounds are normal. She exhibits no distension and no mass. There is no tenderness. There is no rebound and no guarding.  Musculoskeletal: Normal range of motion. She exhibits no edema and no tenderness.  Lymphadenopathy:    She has no cervical adenopathy.  Neurological: She is oriented to person, place, and time.  Skin: Skin is warm and dry. No rash noted. She is not diaphoretic. No erythema. No pallor.  Psychiatric: She has a normal mood and affect. Her behavior is normal. Judgment and thought content normal.      Lab Results  Component Value Date   WBC 5.6 11/03/2008   HGB 13.7 11/03/2008   HCT 40.9 11/03/2008   PLT 257.0 11/03/2008   GLUCOSE 88 11/03/2008   CHOL 224* 11/03/2008   TRIG 51.0 11/03/2008   HDL 61.30 11/03/2008   LDLDIRECT 146.4 11/03/2008   ALT 13 11/03/2008   AST 16 11/03/2008   NA 142 11/03/2008   K 3.9 11/03/2008   CL 111 11/03/2008   CREATININE 0.8 11/03/2008   BUN 7 11/03/2008   CO2 27 11/03/2008   TSH 1.07 11/03/2008  Assessment & Plan:

## 2011-11-01 ENCOUNTER — Other Ambulatory Visit: Payer: Self-pay

## 2011-11-01 MED ORDER — FEXOFENADINE HCL 180 MG PO TABS: 180.0000 mg | ORAL_TABLET | Freq: Every day | ORAL | Status: DC

## 2011-11-01 MED ORDER — TRIAMCINOLONE ACETONIDE(NASAL) 55 MCG/ACT NA INHA: 2.0000 | Freq: Every day | NASAL | Status: DC

## 2011-11-01 NOTE — Assessment & Plan Note (Signed)
I will check her FLP today 

## 2011-11-01 NOTE — Assessment & Plan Note (Signed)
Exam done, vaccines were updated, pt ed material was given, labs ordered

## 2011-11-01 NOTE — Assessment & Plan Note (Signed)
She will restart allegra and nasonex

## 2012-11-19 ENCOUNTER — Other Ambulatory Visit: Payer: Self-pay | Admitting: Internal Medicine

## 2013-04-17 LAB — HM PAP SMEAR: HM Pap smear: NORMAL

## 2013-05-10 ENCOUNTER — Other Ambulatory Visit (INDEPENDENT_AMBULATORY_CARE_PROVIDER_SITE_OTHER): Payer: 59

## 2013-05-10 ENCOUNTER — Ambulatory Visit (INDEPENDENT_AMBULATORY_CARE_PROVIDER_SITE_OTHER): Payer: 59 | Admitting: Internal Medicine

## 2013-05-10 ENCOUNTER — Encounter: Payer: Self-pay | Admitting: Internal Medicine

## 2013-05-10 VITALS — BP 108/74 | HR 96 | Temp 97.2°F | Resp 16 | Ht 64.0 in | Wt 164.0 lb

## 2013-05-10 DIAGNOSIS — B9689 Other specified bacterial agents as the cause of diseases classified elsewhere: Secondary | ICD-10-CM

## 2013-05-10 DIAGNOSIS — J019 Acute sinusitis, unspecified: Secondary | ICD-10-CM

## 2013-05-10 DIAGNOSIS — J309 Allergic rhinitis, unspecified: Secondary | ICD-10-CM

## 2013-05-10 DIAGNOSIS — Z0000 Encounter for general adult medical examination without abnormal findings: Secondary | ICD-10-CM

## 2013-05-10 LAB — CBC WITH DIFFERENTIAL/PLATELET
Basophils Relative: 0.4 % (ref 0.0–3.0)
Eosinophils Relative: 3.8 % (ref 0.0–5.0)
Lymphocytes Relative: 42.4 % (ref 12.0–46.0)
MCV: 89.8 fl (ref 78.0–100.0)
Monocytes Absolute: 0.4 10*3/uL (ref 0.1–1.0)
Monocytes Relative: 7.2 % (ref 3.0–12.0)
Neutrophils Relative %: 46.2 % (ref 43.0–77.0)
RBC: 4.44 Mil/uL (ref 3.87–5.11)
WBC: 5.8 10*3/uL (ref 4.5–10.5)

## 2013-05-10 LAB — LIPID PANEL
Cholesterol: 211 mg/dL — ABNORMAL HIGH (ref 0–200)
HDL: 66.8 mg/dL (ref >39.00)
VLDL: 11.6 mg/dL (ref 0.0–40.0)

## 2013-05-10 LAB — COMPREHENSIVE METABOLIC PANEL
Albumin: 4 g/dL (ref 3.5–5.2)
Alkaline Phosphatase: 42 U/L (ref 39–117)
BUN: 10 mg/dL (ref 6–23)
Glucose, Bld: 76 mg/dL (ref 70–99)
Potassium: 3.7 mEq/L (ref 3.5–5.1)

## 2013-05-10 MED ORDER — TRIAMCINOLONE ACETONIDE(NASAL) 55 MCG/ACT NA INHA: 4.0000 | Freq: Every day | NASAL | Status: DC

## 2013-05-10 MED ORDER — CETIRIZINE HCL 10 MG PO TABS: 10.0000 mg | ORAL_TABLET | Freq: Every day | ORAL | Status: DC

## 2013-05-10 MED ORDER — AMOXICILLIN 875 MG PO TABS: 875.0000 mg | ORAL_TABLET | Freq: Two times a day (BID) | ORAL | Status: DC

## 2013-05-10 NOTE — Progress Notes (Signed)
Subjective:    Patient ID: Felicia Johnson, female    DOB: [birth year], 37 y.o.   MRN: [id number]  HPI  She comes in for a physical but she also complains of a one month history of nasal congestion, sneezing, facial pain.   Review of Systems  Constitutional: Negative.  Negative for fever, chills, diaphoresis, activity change, appetite change, fatigue and unexpected weight change.  HENT: Positive for congestion, rhinorrhea, sneezing, postnasal drip and sinus pressure. Negative for ear pain, nosebleeds, sore throat, facial swelling, trouble swallowing, dental problem and voice change.   Eyes: Negative.   Respiratory: Negative.   Cardiovascular: Negative.  Negative for chest pain, palpitations and leg swelling.  Gastrointestinal: Negative.  Negative for nausea, abdominal pain, diarrhea, constipation and blood in stool.  Endocrine: Negative.   Genitourinary: Negative.   Musculoskeletal: Negative.   Skin: Negative.   Allergic/Immunologic: Negative.   Neurological: Negative.   Hematological: Negative.  Negative for adenopathy. Does not bruise/bleed easily.  Psychiatric/Behavioral: Negative.        Objective:   Physical Exam  Vitals reviewed. Constitutional: She is oriented to person, place, and time. She appears well-developed and well-nourished.  Non-toxic appearance. She does not have a sickly appearance. She does not appear ill. No distress.  HENT:  Head: Normocephalic and atraumatic.  Right Ear: Hearing, tympanic membrane, external ear and ear canal normal.  Left Ear: Hearing, tympanic membrane, external ear and ear canal normal.  Nose: Mucosal edema and rhinorrhea present. No sinus tenderness or nasal deformity.  No foreign bodies. Right sinus exhibits maxillary sinus tenderness. Right sinus exhibits no frontal sinus tenderness. Left sinus exhibits maxillary sinus tenderness. Left sinus exhibits no frontal sinus tenderness.  Mouth/Throat: Oropharynx is clear and moist and mucous  membranes are normal. Mucous membranes are not pale, not dry and not cyanotic. No oral lesions. No trismus in the jaw. No edematous. No oropharyngeal exudate, posterior oropharyngeal edema, posterior oropharyngeal erythema or tonsillar abscesses.  Eyes: Conjunctivae are normal. Right eye exhibits no discharge. Left eye exhibits no discharge. No scleral icterus.  Neck: Normal range of motion. Neck supple. No JVD present. No tracheal deviation present. No thyromegaly present.  Cardiovascular: Normal rate, regular rhythm, normal heart sounds and intact distal pulses.  Exam reveals no gallop and no friction rub.   No murmur heard. Pulmonary/Chest: Effort normal and breath sounds normal. No stridor. No respiratory distress. She has no wheezes. She has no rales. She exhibits no tenderness.  Abdominal: Soft. Bowel sounds are normal. She exhibits no distension and no mass. There is no tenderness. There is no rebound and no guarding.  Musculoskeletal: Normal range of motion. She exhibits no edema and no tenderness.  Lymphadenopathy:    She has no cervical adenopathy.  Neurological: She is oriented to person, place, and time.  Skin: Skin is warm and dry. No rash noted. She is not diaphoretic. No erythema. No pallor.  Psychiatric: She has a normal mood and affect. Her behavior is normal. Judgment and thought content normal.     Lab Results  Component Value Date   WBC 5.5 10/31/2011   HGB 12.9 10/31/2011   HCT 39.0 10/31/2011   PLT 230.0 10/31/2011   GLUCOSE 85 10/31/2011   CHOL 195 10/31/2011   TRIG 42.0 10/31/2011   HDL 65.10 10/31/2011   LDLDIRECT 146.4 11/03/2008   LDLCALC 122* 10/31/2011   ALT 16 10/31/2011   AST 21 10/31/2011   NA 140 10/31/2011   K 4.1 10/31/2011   CL 108  10/31/2011   CREATININE 0.9 10/31/2011   BUN 14 10/31/2011   CO2 26 10/31/2011   TSH 1.45 10/31/2011       Assessment & Plan:

## 2013-05-10 NOTE — Assessment & Plan Note (Signed)
I will treat the infection with amoxil 

## 2013-05-10 NOTE — Assessment & Plan Note (Signed)
Exam done Labs ordered Flu vax updated Pt ed material was given

## 2013-05-10 NOTE — Patient Instructions (Signed)
Preventive Care for Adults, Female A healthy lifestyle and preventive care can promote health and wellness. Preventive health guidelines for women include the following key practices.  A routine yearly physical is a good way to check with your caregiver about your health and preventive screening. It is a chance to share any concerns and updates on your health, and to receive a thorough exam.  Visit your dentist for a routine exam and preventive care every 6 months. Brush your teeth twice a day and floss once a day. Good oral hygiene prevents tooth decay and gum disease.  The frequency of eye exams is based on your age, health, family medical history, use of contact lenses, and other factors. Follow your caregiver's recommendations for frequency of eye exams.  Eat a healthy diet. Foods like vegetables, fruits, whole grains, low-fat dairy products, and lean protein foods contain the nutrients you need without too many calories. Decrease your intake of foods high in solid fats, added sugars, and salt. Eat the right amount of calories for you.Get information about a proper diet from your caregiver, if necessary.  Regular physical exercise is one of the most important things you can do for your health. Most adults should get at least 150 minutes of moderate-intensity exercise (any activity that increases your heart rate and causes you to sweat) each week. In addition, most adults need muscle-strengthening exercises on 2 or more days a week.  Maintain a healthy weight. The body mass index (BMI) is a screening tool to identify possible weight problems. It provides an estimate of body fat based on height and weight. Your caregiver can help determine your BMI, and can help you achieve or maintain a healthy weight.For adults 20 years and older:  A BMI below 18.5 is considered underweight.  A BMI of 18.5 to 24.9 is normal.  A BMI of 25 to 29.9 is considered overweight.  A BMI of 30 and above is  considered obese.  Maintain normal blood lipids and cholesterol levels by exercising and minimizing your intake of saturated fat. Eat a balanced diet with plenty of fruit and vegetables. Blood tests for lipids and cholesterol should begin at age 20 and be repeated every 5 years. If your lipid or cholesterol levels are high, you are over 50, or you are at high risk for heart disease, you may need your cholesterol levels checked more frequently.Ongoing high lipid and cholesterol levels should be treated with medicines if diet and exercise are not effective.  If you smoke, find out from your caregiver how to quit. If you do not use tobacco, do not start.  If you are pregnant, do not drink alcohol. If you are breastfeeding, be very cautious about drinking alcohol. If you are not pregnant and choose to drink alcohol, do not exceed 1 drink per day. One drink is considered to be 12 ounces (355 mL) of beer, 5 ounces (148 mL) of wine, or 1.5 ounces (44 mL) of liquor.  Avoid use of street drugs. Do not share needles with anyone. Ask for help if you need support or instructions about stopping the use of drugs.  High blood pressure causes heart disease and increases the risk of stroke. Your blood pressure should be checked at least every 1 to 2 years. Ongoing high blood pressure should be treated with medicines if weight loss and exercise are not effective.  If you are 55 to 35 years old, ask your caregiver if you should take aspirin to prevent strokes.  Diabetes   screening involves taking a blood sample to check your fasting blood sugar level. This should be done once every 3 years, after age 45, if you are within normal weight and without risk factors for diabetes. Testing should be considered at a younger age or be carried out more frequently if you are overweight and have at least 1 risk factor for diabetes.  Breast cancer screening is essential preventive care for women. You should practice "breast  self-awareness." This means understanding the normal appearance and feel of your breasts and may include breast self-examination. Any changes detected, no matter how small, should be reported to a caregiver. Women in their 20s and 30s should have a clinical breast exam (CBE) by a caregiver as part of a regular health exam every 1 to 3 years. After age 40, women should have a CBE every year. Starting at age 40, women should consider having a mammography (breast X-ray test) every year. Women who have a family history of breast cancer should talk to their caregiver about genetic screening. Women at a high risk of breast cancer should talk to their caregivers about having magnetic resonance imaging (MRI) and a mammography every year.  The Pap test is a screening test for cervical cancer. A Pap test can show cell changes on the cervix that might become cervical cancer if left untreated. A Pap test is a procedure in which cells are obtained and examined from the lower end of the uterus (cervix).  Women should have a Pap test starting at age 21.  Between ages 21 and 29, Pap tests should be repeated every 2 years.  Beginning at age 30, you should have a Pap test every 3 years as long as the past 3 Pap tests have been normal.  Some women have medical problems that increase the chance of getting cervical cancer. Talk to your caregiver about these problems. It is especially important to talk to your caregiver if a new problem develops soon after your last Pap test. In these cases, your caregiver may recommend more frequent screening and Pap tests.  The above recommendations are the same for women who have or have not gotten the vaccine for human papillomavirus (HPV).  If you had a hysterectomy for a problem that was not cancer or a condition that could lead to cancer, then you no longer need Pap tests. Even if you no longer need a Pap test, a regular exam is a good idea to make sure no other problems are  starting.  If you are between ages 65 and 70, and you have had normal Pap tests going back 10 years, you no longer need Pap tests. Even if you no longer need a Pap test, a regular exam is a good idea to make sure no other problems are starting.  If you have had past treatment for cervical cancer or a condition that could lead to cancer, you need Pap tests and screening for cancer for at least 20 years after your treatment.  If Pap tests have been discontinued, risk factors (such as a new sexual partner) need to be reassessed to determine if screening should be resumed.  The HPV test is an additional test that may be used for cervical cancer screening. The HPV test looks for the virus that can cause the cell changes on the cervix. The cells collected during the Pap test can be tested for HPV. The HPV test could be used to screen women aged 30 years and older, and should   be used in women of any age who have unclear Pap test results. After the age of 30, women should have HPV testing at the same frequency as a Pap test.  Colorectal cancer can be detected and often prevented. Most routine colorectal cancer screening begins at the age of 50 and continues through age 75. However, your caregiver may recommend screening at an earlier age if you have risk factors for colon cancer. On a yearly basis, your caregiver may provide home test kits to check for hidden blood in the stool. Use of a small camera at the end of a tube, to directly examine the colon (sigmoidoscopy or colonoscopy), can detect the earliest forms of colorectal cancer. Talk to your caregiver about this at age 50, when routine screening begins. Direct examination of the colon should be repeated every 5 to 10 years through age 75, unless early forms of pre-cancerous polyps or small growths are found.  Hepatitis C blood testing is recommended for all people born from 1945 through 1965 and any individual with known risks for hepatitis C.  Practice  safe sex. Use condoms and avoid high-risk sexual practices to reduce the spread of sexually transmitted infections (STIs). STIs include gonorrhea, chlamydia, syphilis, trichomonas, herpes, HPV, and human immunodeficiency virus (HIV). Herpes, HIV, and HPV are viral illnesses that have no cure. They can result in disability, cancer, and death. Sexually active women aged 25 and younger should be checked for chlamydia. Older women with new or multiple partners should also be tested for chlamydia. Testing for other STIs is recommended if you are sexually active and at increased risk.  Osteoporosis is a disease in which the bones lose minerals and strength with aging. This can result in serious bone fractures. The risk of osteoporosis can be identified using a bone density scan. Women ages 65 and over and women at risk for fractures or osteoporosis should discuss screening with their caregivers. Ask your caregiver whether you should take a calcium supplement or vitamin D to reduce the rate of osteoporosis.  Menopause can be associated with physical symptoms and risks. Hormone replacement therapy is available to decrease symptoms and risks. You should talk to your caregiver about whether hormone replacement therapy is right for you.  Use sunscreen with sun protection factor (SPF) of 30 or more. Apply sunscreen liberally and repeatedly throughout the day. You should seek shade when your shadow is shorter than you. Protect yourself by wearing long sleeves, pants, a wide-brimmed hat, and sunglasses year round, whenever you are outdoors.  Once a month, do a whole body skin exam, using a mirror to look at the skin on your back. Notify your caregiver of new moles, moles that have irregular borders, moles that are larger than a pencil eraser, or moles that have changed in shape or color.  Stay current with required immunizations.  Influenza. You need a dose every fall (or winter). The composition of the flu vaccine  changes each year, so being vaccinated once is not enough.  Pneumococcal polysaccharide. You need 1 to 2 doses if you smoke cigarettes or if you have certain chronic medical conditions. You need 1 dose at age 65 (or older) if you have never been vaccinated.  Tetanus, diphtheria, pertussis (Tdap, Td). Get 1 dose of Tdap vaccine if you are younger than age 65, are over 65 and have contact with an infant, are a healthcare worker, are pregnant, or simply want to be protected from whooping cough. After that, you need a Td   booster dose every 10 years. Consult your caregiver if you have not had at least 3 tetanus and diphtheria-containing shots sometime in your life or have a deep or dirty wound.  HPV. You need this vaccine if you are a woman age 26 or younger. The vaccine is given in 3 doses over 6 months.  Measles, mumps, rubella (MMR). You need at least 1 dose of MMR if you were born in 1957 or later. You may also need a second dose.  Meningococcal. If you are age 19 to 21 and a first-year college student living in a residence [place], or have one of several medical conditions, you need to get vaccinated against meningococcal disease. You may also need additional booster doses.  Zoster (shingles). If you are age 60 or older, you should get this vaccine.  Varicella (chickenpox). If you have never had chickenpox or you were vaccinated but received only 1 dose, talk to your caregiver to find out if you need this vaccine.  Hepatitis A. You need this vaccine if you have a specific risk factor for hepatitis A virus infection or you simply wish to be protected from this disease. The vaccine is usually given as 2 doses, 6 to 18 months apart.  Hepatitis B. You need this vaccine if you have a specific risk factor for hepatitis B virus infection or you simply wish to be protected from this disease. The vaccine is given in 3 doses, usually over 6 months. Preventive Services / Frequency Ages 19 to 39  Blood  pressure check.** / Every 1 to 2 years.  Lipid and cholesterol check.** / Every 5 years beginning at age 20.  Clinical breast exam.** / Every 3 years for women in their 20s and 30s.  Pap test.** / Every 2 years from ages 21 through 29. Every 3 years starting at age 30 through age 65 or 70 with a history of 3 consecutive normal Pap tests.  HPV screening.** / Every 3 years from ages 30 through ages 65 to 70 with a history of 3 consecutive normal Pap tests.  Hepatitis C blood test.** / For any individual with known risks for hepatitis C.  Skin self-exam. / Monthly.  Influenza immunization.** / Every year.  Pneumococcal polysaccharide immunization.** / 1 to 2 doses if you smoke cigarettes or if you have certain chronic medical conditions.  Tetanus, diphtheria, pertussis (Tdap, Td) immunization. / A one-time dose of Tdap vaccine. After that, you need a Td booster dose every 10 years.  HPV immunization. / 3 doses over 6 months, if you are 26 and younger.  Measles, mumps, rubella (MMR) immunization. / You need at least 1 dose of MMR if you were born in 1957 or later. You may also need a second dose.  Meningococcal immunization. / 1 dose if you are age 19 to 21 and a first-year college student living in a residence [place], or have one of several medical conditions, you need to get vaccinated against meningococcal disease. You may also need additional booster doses.  Varicella immunization.** / Consult your caregiver.  Hepatitis A immunization.** / Consult your caregiver. 2 doses, 6 to 18 months apart.  Hepatitis B immunization.** / Consult your caregiver. 3 doses usually over 6 months. Ages 40 to 64  Blood pressure check.** / Every 1 to 2 years.  Lipid and cholesterol check.** / Every 5 years beginning at age 20.  Clinical breast exam.** / Every year after age 40.  Mammogram.** / Every year beginning at age 40   and continuing for as long as you are in good health. Consult with your  caregiver.  Pap test.** / Every 3 years starting at age 30 through age 65 or 70 with a history of 3 consecutive normal Pap tests.  HPV screening.** / Every 3 years from ages 30 through ages 65 to 70 with a history of 3 consecutive normal Pap tests.  Fecal occult blood test (FOBT) of stool. / Every year beginning at age 50 and continuing until age 75. You may not need to do this test if you get a colonoscopy every 10 years.  Flexible sigmoidoscopy or colonoscopy.** / Every 5 years for a flexible sigmoidoscopy or every 10 years for a colonoscopy beginning at age 50 and continuing until age 75.  Hepatitis C blood test.** / For all people born from 1945 through 1965 and any individual with known risks for hepatitis C.  Skin self-exam. / Monthly.  Influenza immunization.** / Every year.  Pneumococcal polysaccharide immunization.** / 1 to 2 doses if you smoke cigarettes or if you have certain chronic medical conditions.  Tetanus, diphtheria, pertussis (Tdap, Td) immunization.** / A one-time dose of Tdap vaccine. After that, you need a Td booster dose every 10 years.  Measles, mumps, rubella (MMR) immunization. / You need at least 1 dose of MMR if you were born in 1957 or later. You may also need a second dose.  Varicella immunization.** / Consult your caregiver.  Meningococcal immunization.** / Consult your caregiver.  Hepatitis A immunization.** / Consult your caregiver. 2 doses, 6 to 18 months apart.  Hepatitis B immunization.** / Consult your caregiver. 3 doses, usually over 6 months. Ages 65 and over  Blood pressure check.** / Every 1 to 2 years.  Lipid and cholesterol check.** / Every 5 years beginning at age 20.  Clinical breast exam.** / Every year after age 40.  Mammogram.** / Every year beginning at age 40 and continuing for as long as you are in good health. Consult with your caregiver.  Pap test.** / Every 3 years starting at age 30 through age 65 or 70 with a 3  consecutive normal Pap tests. Testing can be stopped between 65 and 70 with 3 consecutive normal Pap tests and no abnormal Pap or HPV tests in the past 10 years.  HPV screening.** / Every 3 years from ages 30 through ages 65 or 70 with a history of 3 consecutive normal Pap tests. Testing can be stopped between 65 and 70 with 3 consecutive normal Pap tests and no abnormal Pap or HPV tests in the past 10 years.  Fecal occult blood test (FOBT) of stool. / Every year beginning at age 50 and continuing until age 75. You may not need to do this test if you get a colonoscopy every 10 years.  Flexible sigmoidoscopy or colonoscopy.** / Every 5 years for a flexible sigmoidoscopy or every 10 years for a colonoscopy beginning at age 50 and continuing until age 75.  Hepatitis C blood test.** / For all people born from 1945 through 1965 and any individual with known risks for hepatitis C.  Osteoporosis screening.** / A one-time screening for women ages 65 and over and women at risk for fractures or osteoporosis.  Skin self-exam. / Monthly.  Influenza immunization.** / Every year.  Pneumococcal polysaccharide immunization.** / 1 dose at age 65 (or older) if you have never been vaccinated.  Tetanus, diphtheria, pertussis (Tdap, Td) immunization. / A one-time dose of Tdap vaccine if you are over   65 and have contact with an infant, are a healthcare worker, or simply want to be protected from whooping cough. After that, you need a Td booster dose every 10 years.  Varicella immunization.** / Consult your caregiver.  Meningococcal immunization.** / Consult your caregiver.  Hepatitis A immunization.** / Consult your caregiver. 2 doses, 6 to 18 months apart.  Hepatitis B immunization.** / Check with your caregiver. 3 doses, usually over 6 months. ** Family history and personal history of risk and conditions may change your caregiver's recommendations. Document Released: 09/20/2001 Document Revised: 10/17/2011  Document Reviewed: 12/20/2010 [employer] Patient Information 2014 [employer].  

## 2013-05-10 NOTE — Assessment & Plan Note (Signed)
Will treat with zyrtec and steroid ns

## 2013-05-12 ENCOUNTER — Encounter: Payer: Self-pay | Admitting: Internal Medicine

## 2013-05-29 ENCOUNTER — Telehealth: Payer: Self-pay | Admitting: Internal Medicine

## 2013-05-29 MED ORDER — FLUCONAZOLE 150 MG PO TABS: 150.0000 mg | ORAL_TABLET | Freq: Once | ORAL | Status: DC

## 2013-05-29 NOTE — Telephone Encounter (Signed)
Pt is taking antibiotics (1 left to take).  She has a yeast infection.  Please call something in for this.  You can leave a message with her husband at the home number.

## 2013-05-29 NOTE — Telephone Encounter (Signed)
   Start diflucan

## 2013-05-30 NOTE — Telephone Encounter (Signed)
LMOM to call back

## 2014-05-01 LAB — HM PAP SMEAR: HM PAP: NORMAL

## 2014-10-23 ENCOUNTER — Encounter: Payer: Self-pay | Admitting: Internal Medicine

## 2014-10-23 ENCOUNTER — Other Ambulatory Visit (INDEPENDENT_AMBULATORY_CARE_PROVIDER_SITE_OTHER): Payer: 59

## 2014-10-23 ENCOUNTER — Ambulatory Visit (INDEPENDENT_AMBULATORY_CARE_PROVIDER_SITE_OTHER): Payer: 59 | Admitting: Internal Medicine

## 2014-10-23 VITALS — BP 118/82 | HR 70 | Temp 97.9°F | Resp 16 | Ht 64.0 in | Wt 162.0 lb

## 2014-10-23 DIAGNOSIS — Z0000 Encounter for general adult medical examination without abnormal findings: Secondary | ICD-10-CM

## 2014-10-23 DIAGNOSIS — Z23 Encounter for immunization: Secondary | ICD-10-CM

## 2014-10-23 DIAGNOSIS — J302 Other seasonal allergic rhinitis: Secondary | ICD-10-CM

## 2014-10-23 LAB — CBC WITH DIFFERENTIAL/PLATELET
BASOS ABS: 0 10*3/uL (ref 0.0–0.1)
Basophils Relative: 0.4 % (ref 0.0–3.0)
EOS PCT: 3 % (ref 0.0–5.0)
Eosinophils Absolute: 0.2 10*3/uL (ref 0.0–0.7)
HCT: 40.2 % (ref 36.0–46.0)
Hemoglobin: 13.7 g/dL (ref 12.0–15.0)
LYMPHS ABS: 2.5 10*3/uL (ref 0.7–4.0)
Lymphocytes Relative: 49.3 % — ABNORMAL HIGH (ref 12.0–46.0)
MCHC: 34.2 g/dL (ref 30.0–36.0)
MCV: 87.8 fl (ref 78.0–100.0)
Monocytes Absolute: 0.5 10*3/uL (ref 0.1–1.0)
Monocytes Relative: 9.5 % (ref 3.0–12.0)
Neutro Abs: 1.9 10*3/uL (ref 1.4–7.7)
Neutrophils Relative %: 37.8 % — ABNORMAL LOW (ref 43.0–77.0)
Platelets: 292 10*3/uL (ref 150.0–400.0)
RBC: 4.58 Mil/uL (ref 3.87–5.11)
RDW: 14.2 % (ref 11.5–15.5)
WBC: 5.1 10*3/uL (ref 4.0–10.5)

## 2014-10-23 LAB — LIPID PANEL
CHOL/HDL RATIO: 3
CHOLESTEROL: 209 mg/dL — AB (ref 0–200)
HDL: 64.9 mg/dL (ref >39.00)
LDL Cholesterol: 134 mg/dL — ABNORMAL HIGH (ref 0–99)
NonHDL: 144.1
Triglycerides: 49 mg/dL (ref 0.0–149.0)
VLDL: 9.8 mg/dL (ref 0.0–40.0)

## 2014-10-23 LAB — COMPREHENSIVE METABOLIC PANEL
ALK PHOS: 50 U/L (ref 39–117)
ALT: 13 U/L (ref 0–35)
AST: 16 U/L (ref 0–37)
Albumin: 4.2 g/dL (ref 3.5–5.2)
BILIRUBIN TOTAL: 0.3 mg/dL (ref 0.2–1.2)
BUN: 9 mg/dL (ref 6–23)
CALCIUM: 9.1 mg/dL (ref 8.4–10.5)
CO2: 28 mEq/L (ref 19–32)
CREATININE: 0.82 mg/dL (ref 0.40–1.20)
Chloride: 105 mEq/L (ref 96–112)
GFR: 100.81 mL/min (ref >60.00)
GLUCOSE: 85 mg/dL (ref 70–99)
Potassium: 4.1 mEq/L (ref 3.5–5.1)
SODIUM: 138 meq/L (ref 135–145)
TOTAL PROTEIN: 7.3 g/dL (ref 6.0–8.3)

## 2014-10-23 LAB — TSH: TSH: 1.25 u[IU]/mL (ref 0.35–4.50)

## 2014-10-23 MED ORDER — TRIAMCINOLONE ACETONIDE 55 MCG/ACT NA AERO: 4.0000 | INHALATION_SPRAY | Freq: Every day | NASAL | Status: DC

## 2014-10-23 MED ORDER — CETIRIZINE HCL 10 MG PO TABS: 10.0000 mg | ORAL_TABLET | Freq: Every day | ORAL | Status: DC

## 2014-10-23 NOTE — Progress Notes (Signed)
Pre visit review using our clinic review tool, if applicable. No additional management support is needed unless otherwise documented below in the visit note. 

## 2014-10-23 NOTE — Progress Notes (Signed)
   Subjective:    Patient ID: Felicia Johnson, female    DOB: 22-Nov-1975, 39 y.o.   MRN: [id number]  HPI Comments: She comes in for a physical but she also complains of runny nose and nasal congestion.     Review of Systems  Constitutional: Negative.   HENT: Positive for postnasal drip, rhinorrhea and sneezing. Negative for facial swelling, nosebleeds, sinus pressure, sore throat, trouble swallowing and voice change.   Eyes: Negative.   Respiratory: Negative.  Negative for cough, choking, chest tightness, shortness of breath and stridor.   Cardiovascular: Negative.  Negative for chest pain, palpitations and leg swelling.  Gastrointestinal: Negative.  Negative for abdominal pain.  Endocrine: Negative.   Genitourinary: Negative.   Musculoskeletal: Negative.   Skin: Negative.  Negative for rash.  Allergic/Immunologic: Negative.   Neurological: Negative.   Hematological: Negative.  Negative for adenopathy. Does not bruise/bleed easily.  Psychiatric/Behavioral: Negative.        Objective:   Physical Exam  Constitutional: She is oriented to person, place, and time. She appears well-developed and well-nourished. No distress.  HENT:  Head: Normocephalic and atraumatic.  Right Ear: Hearing, tympanic membrane, external ear and ear canal normal.  Left Ear: Hearing, tympanic membrane, external ear and ear canal normal.  Nose: Mucosal edema and rhinorrhea present. No nose lacerations, sinus tenderness, nasal deformity, septal deviation or nasal septal hematoma. No epistaxis.  No foreign bodies. Right sinus exhibits no maxillary sinus tenderness and no frontal sinus tenderness. Left sinus exhibits no maxillary sinus tenderness and no frontal sinus tenderness.  Mouth/Throat: Oropharynx is clear and moist and mucous membranes are normal. Mucous membranes are not pale, not dry and not cyanotic. No oropharyngeal exudate, posterior oropharyngeal edema, posterior oropharyngeal erythema or tonsillar  abscesses.  Eyes: Conjunctivae are normal. Right eye exhibits no discharge. Left eye exhibits no discharge. No scleral icterus.  Neck: Normal range of motion. Neck supple. No JVD present. No tracheal deviation present. No thyromegaly present.  Cardiovascular: Normal rate, regular rhythm and intact distal pulses.  Exam reveals no gallop and no friction rub.   No murmur heard. Pulmonary/Chest: Effort normal and breath sounds normal. No stridor. No respiratory distress. She has no wheezes. She has no rales. She exhibits no tenderness.  Abdominal: Soft. Bowel sounds are normal. She exhibits no distension and no mass. There is no tenderness. There is no rebound and no guarding.  Musculoskeletal: Normal range of motion. She exhibits no edema or tenderness.  Lymphadenopathy:    She has no cervical adenopathy.  Neurological: She is oriented to person, place, and time.  Skin: Skin is warm and dry. No rash noted. She is not diaphoretic. No erythema. No pallor.  Vitals reviewed.     Lab Results  Component Value Date   WBC 5.8 05/10/2013   HGB 13.2 05/10/2013   HCT 39.8 05/10/2013   PLT 276.0 05/10/2013   GLUCOSE 76 05/10/2013   CHOL 211* 05/10/2013   TRIG 58.0 05/10/2013   HDL 66.80 05/10/2013   LDLDIRECT 134.7 05/10/2013   LDLCALC 122* 10/31/2011   ALT 17 05/10/2013   AST 20 05/10/2013   NA 138 05/10/2013   K 3.7 05/10/2013   CL 109 05/10/2013   CREATININE 0.8 05/10/2013   BUN 10 05/10/2013   CO2 24 05/10/2013   TSH 1.54 05/10/2013      Assessment & Plan:

## 2014-10-23 NOTE — Patient Instructions (Signed)
Preventive Care for Adults A healthy lifestyle and preventive care can promote health and wellness. Preventive health guidelines for women include the following key practices.  A routine yearly physical is a good way to check with your health care provider about your health and preventive screening. It is a chance to share any concerns and updates on your health and to receive a thorough exam.  Visit your dentist for a routine exam and preventive care every 6 months. Brush your teeth twice a day and floss once a day. Good oral hygiene prevents tooth decay and gum disease.  The frequency of eye exams is based on your age, health, family medical history, use of contact lenses, and other factors. Follow your health care provider's recommendations for frequency of eye exams.  Eat a healthy diet. Foods like vegetables, fruits, whole grains, low-fat dairy products, and lean protein foods contain the nutrients you need without too many calories. Decrease your intake of foods high in solid fats, added sugars, and salt. Eat the right amount of calories for you.Get information about a proper diet from your health care provider, if necessary.  Regular physical exercise is one of the most important things you can do for your health. Most adults should get at least 150 minutes of moderate-intensity exercise (any activity that increases your heart rate and causes you to sweat) each week. In addition, most adults need muscle-strengthening exercises on 2 or more days a week.  Maintain a healthy weight. The body mass index (BMI) is a screening tool to identify possible weight problems. It provides an estimate of body fat based on height and weight. Your health care provider can find your BMI and can help you achieve or maintain a healthy weight.For adults 20 years and older:  A BMI below 18.5 is considered underweight.  A BMI of 18.5 to 24.9 is normal.  A BMI of 25 to 29.9 is considered overweight.  A BMI of  30 and above is considered obese.  Maintain normal blood lipids and cholesterol levels by exercising and minimizing your intake of saturated fat. Eat a balanced diet with plenty of fruit and vegetables. Blood tests for lipids and cholesterol should begin at age 76 and be repeated every 5 years. If your lipid or cholesterol levels are high, you are over 50, or you are at high risk for heart disease, you may need your cholesterol levels checked more frequently.Ongoing high lipid and cholesterol levels should be treated with medicines if diet and exercise are not working.  If you smoke, find out from your health care provider how to quit. If you do not use tobacco, do not start.  Lung cancer screening is recommended for adults aged 22-80 years who are at high risk for developing lung cancer because of a history of smoking. A yearly low-dose CT scan of the lungs is recommended for people who have at least a 30-pack-year history of smoking and are a current smoker or have quit within the past 15 years. A pack year of smoking is smoking an average of 1 pack of cigarettes a day for 1 year (for example: 1 pack a day for 30 years or 2 packs a day for 15 years). Yearly screening should continue until the smoker has stopped smoking for at least 15 years. Yearly screening should be stopped for people who develop a health problem that would prevent them from having lung cancer treatment.  If you are pregnant, do not drink alcohol. If you are breastfeeding,  be very cautious about drinking alcohol. If you are not pregnant and choose to drink alcohol, do not have more than 1 drink per day. One drink is considered to be 12 ounces (355 mL) of beer, 5 ounces (148 mL) of wine, or 1.5 ounces (44 mL) of liquor.  Avoid use of street drugs. Do not share needles with anyone. Ask for help if you need support or instructions about stopping the use of drugs.  High blood pressure causes heart disease and increases the risk of  stroke. Your blood pressure should be checked at least every 1 to 2 years. Ongoing high blood pressure should be treated with medicines if weight loss and exercise do not work.  If you are 75-52 years old, ask your health care provider if you should take aspirin to prevent strokes.  Diabetes screening involves taking a blood sample to check your fasting blood sugar level. This should be done once every 3 years, after age 15, if you are within normal weight and without risk factors for diabetes. Testing should be considered at a younger age or be carried out more frequently if you are overweight and have at least 1 risk factor for diabetes.  Breast cancer screening is essential preventive care for women. You should practice "breast self-awareness." This means understanding the normal appearance and feel of your breasts and may include breast self-examination. Any changes detected, no matter how small, should be reported to a health care provider. Women in their 58s and 30s should have a clinical breast exam (CBE) by a health care provider as part of a regular health exam every 1 to 3 years. After age 16, women should have a CBE every year. Starting at age 53, women should consider having a mammogram (breast X-ray test) every year. Women who have a family history of breast cancer should talk to their health care provider about genetic screening. Women at a high risk of breast cancer should talk to their health care providers about having an MRI and a mammogram every year.  Breast cancer gene (BRCA)-related cancer risk assessment is recommended for women who have family members with BRCA-related cancers. BRCA-related cancers include breast, ovarian, tubal, and peritoneal cancers. Having family members with these cancers may be associated with an increased risk for harmful changes (mutations) in the breast cancer genes BRCA1 and BRCA2. Results of the assessment will determine the need for genetic counseling and  BRCA1 and BRCA2 testing.  Routine pelvic exams to screen for cancer are no longer recommended for nonpregnant women who are considered low risk for cancer of the pelvic organs (ovaries, uterus, and vagina) and who do not have symptoms. Ask your health care provider if a screening pelvic exam is right for you.  If you have had past treatment for cervical cancer or a condition that could lead to cancer, you need Pap tests and screening for cancer for at least 20 years after your treatment. If Pap tests have been discontinued, your risk factors (such as having a new sexual partner) need to be reassessed to determine if screening should be resumed. Some women have medical problems that increase the chance of getting cervical cancer. In these cases, your health care provider may recommend more frequent screening and Pap tests.  The HPV test is an additional test that may be used for cervical cancer screening. The HPV test looks for the virus that can cause the cell changes on the cervix. The cells collected during the Pap test can be  tested for HPV. The HPV test could be used to screen women aged 30 years and older, and should be used in women of any age who have unclear Pap test results. After the age of 30, women should have HPV testing at the same frequency as a Pap test.  Colorectal cancer can be detected and often prevented. Most routine colorectal cancer screening begins at the age of 50 years and continues through age 75 years. However, your health care provider may recommend screening at an earlier age if you have risk factors for colon cancer. On a yearly basis, your health care provider may provide home test kits to check for hidden blood in the stool. Use of a small camera at the end of a tube, to directly examine the colon (sigmoidoscopy or colonoscopy), can detect the earliest forms of colorectal cancer. Talk to your health care provider about this at age 50, when routine screening begins. Direct  exam of the colon should be repeated every 5-10 years through age 75 years, unless early forms of pre-cancerous polyps or small growths are found.  People who are at an increased risk for hepatitis B should be screened for this virus. You are considered at high risk for hepatitis B if:  You were born in a country where hepatitis B occurs often. Talk with your health care provider about which countries are considered high risk.  Your parents were born in a high-risk country and you have not received a shot to protect against hepatitis B (hepatitis B vaccine).  You have HIV or AIDS.  You use needles to inject street drugs.  You live with, or have sex with, someone who has hepatitis B.  You get hemodialysis treatment.  You take certain medicines for conditions like cancer, organ transplantation, and autoimmune conditions.  Hepatitis C blood testing is recommended for all people born from 1945 through 1965 and any individual with known risks for hepatitis C.  Practice safe sex. Use condoms and avoid high-risk sexual practices to reduce the spread of sexually transmitted infections (STIs). STIs include gonorrhea, chlamydia, syphilis, trichomonas, herpes, HPV, and human immunodeficiency virus (HIV). Herpes, HIV, and HPV are viral illnesses that have no cure. They can result in disability, cancer, and death.  You should be screened for sexually transmitted illnesses (STIs) including gonorrhea and chlamydia if:  You are sexually active and are younger than 24 years.  You are older than 24 years and your health care provider tells you that you are at risk for this type of infection.  Your sexual activity has changed since you were last screened and you are at an increased risk for chlamydia or gonorrhea. Ask your health care provider if you are at risk.  If you are at risk of being infected with HIV, it is recommended that you take a prescription medicine daily to prevent HIV infection. This is  called preexposure prophylaxis (PrEP). You are considered at risk if:  You are a heterosexual woman, are sexually active, and are at increased risk for HIV infection.  You take drugs by injection.  You are sexually active with a partner who has HIV.  Talk with your health care provider about whether you are at high risk of being infected with HIV. If you choose to begin PrEP, you should first be tested for HIV. You should then be tested every 3 months for as long as you are taking PrEP.  Osteoporosis is a disease in which the bones lose minerals and strength   with aging. This can result in serious bone fractures or breaks. The risk of osteoporosis can be identified using a bone density scan. Women ages 65 years and over and women at risk for fractures or osteoporosis should discuss screening with their health care providers. Ask your health care provider whether you should take a calcium supplement or vitamin D to reduce the rate of osteoporosis.  Menopause can be associated with physical symptoms and risks. Hormone replacement therapy is available to decrease symptoms and risks. You should talk to your health care provider about whether hormone replacement therapy is right for you.  Use sunscreen. Apply sunscreen liberally and repeatedly throughout the day. You should seek shade when your shadow is shorter than you. Protect yourself by wearing long sleeves, pants, a wide-brimmed hat, and sunglasses year round, whenever you are outdoors.  Once a month, do a whole body skin exam, using a mirror to look at the skin on your back. Tell your health care provider of new moles, moles that have irregular borders, moles that are larger than a pencil eraser, or moles that have changed in shape or color.  Stay current with required vaccines (immunizations).  Influenza vaccine. All adults should be immunized every year.  Tetanus, diphtheria, and acellular pertussis (Td, Tdap) vaccine. Pregnant women should  receive 1 dose of Tdap vaccine during each pregnancy. The dose should be obtained regardless of the length of time since the last dose. Immunization is preferred during the 27th-36th week of gestation. An adult who has not previously received Tdap or who does not know her vaccine status should receive 1 dose of Tdap. This initial dose should be followed by tetanus and diphtheria toxoids (Td) booster doses every 10 years. Adults with an unknown or incomplete history of completing a 3-dose immunization series with Td-containing vaccines should begin or complete a primary immunization series including a Tdap dose. Adults should receive a Td booster every 10 years.  Varicella vaccine. An adult without evidence of immunity to varicella should receive 2 doses or a second dose if she has previously received 1 dose. Pregnant females who do not have evidence of immunity should receive the first dose after pregnancy. This first dose should be obtained before leaving the health care facility. The second dose should be obtained 4-8 weeks after the first dose.  Human papillomavirus (HPV) vaccine. Females aged 13-26 years who have not received the vaccine previously should obtain the 3-dose series. The vaccine is not recommended for use in pregnant females. However, pregnancy testing is not needed before receiving a dose. If a female is found to be pregnant after receiving a dose, no treatment is needed. In that case, the remaining doses should be delayed until after the pregnancy. Immunization is recommended for any person with an immunocompromised condition through the age of 26 years if she did not get any or all doses earlier. During the 3-dose series, the second dose should be obtained 4-8 weeks after the first dose. The third dose should be obtained 24 weeks after the first dose and 16 weeks after the second dose.  Zoster vaccine. One dose is recommended for adults aged 60 years or older unless certain conditions are  present.  Measles, mumps, and rubella (MMR) vaccine. Adults born before 1957 generally are considered immune to measles and mumps. Adults born in 1957 or later should have 1 or more doses of MMR vaccine unless there is a contraindication to the vaccine or there is laboratory evidence of immunity to   each of the three diseases. A routine second dose of MMR vaccine should be obtained at least 28 days after the first dose for students attending postsecondary schools, health care workers, or international travelers. People who received inactivated measles vaccine or an unknown type of measles vaccine during 1963-1967 should receive 2 doses of MMR vaccine. People who received inactivated mumps vaccine or an unknown type of mumps vaccine before 1979 and are at high risk for mumps infection should consider immunization with 2 doses of MMR vaccine. For females of childbearing age, rubella immunity should be determined. If there is no evidence of immunity, females who are not pregnant should be vaccinated. If there is no evidence of immunity, females who are pregnant should delay immunization until after pregnancy. Unvaccinated health care workers born before 1957 who lack laboratory evidence of measles, mumps, or rubella immunity or laboratory confirmation of disease should consider measles and mumps immunization with 2 doses of MMR vaccine or rubella immunization with 1 dose of MMR vaccine.  Pneumococcal 13-valent conjugate (PCV13) vaccine. When indicated, a person who is uncertain of her immunization history and has no record of immunization should receive the PCV13 vaccine. An adult aged 19 years or older who has certain medical conditions and has not been previously immunized should receive 1 dose of PCV13 vaccine. This PCV13 should be followed with a dose of pneumococcal polysaccharide (PPSV23) vaccine. The PPSV23 vaccine dose should be obtained at least 8 weeks after the dose of PCV13 vaccine. An adult aged 19  years or older who has certain medical conditions and previously received 1 or more doses of PPSV23 vaccine should receive 1 dose of PCV13. The PCV13 vaccine dose should be obtained 1 or more years after the last PPSV23 vaccine dose.  Pneumococcal polysaccharide (PPSV23) vaccine. When PCV13 is also indicated, PCV13 should be obtained first. All adults aged 65 years and older should be immunized. An adult younger than age 65 years who has certain medical conditions should be immunized. Any person who resides in a nursing home or long-term care facility should be immunized. An adult smoker should be immunized. People with an immunocompromised condition and certain other conditions should receive both PCV13 and PPSV23 vaccines. People with human immunodeficiency virus (HIV) infection should be immunized as soon as possible after diagnosis. Immunization during chemotherapy or radiation therapy should be avoided. Routine use of PPSV23 vaccine is not recommended for American Indians, [place] Natives, or people younger than 65 years unless there are medical conditions that require PPSV23 vaccine. When indicated, people who have unknown immunization and have no record of immunization should receive PPSV23 vaccine. One-time revaccination 5 years after the first dose of PPSV23 is recommended for people aged 19-64 years who have chronic kidney failure, nephrotic syndrome, asplenia, or immunocompromised conditions. People who received 1-2 doses of PPSV23 before age 65 years should receive another dose of PPSV23 vaccine at age 65 years or later if at least 5 years have passed since the previous dose. Doses of PPSV23 are not needed for people immunized with PPSV23 at or after age 65 years.  Meningococcal vaccine. Adults with asplenia or persistent complement component deficiencies should receive 2 doses of quadrivalent meningococcal conjugate (MenACWY-D) vaccine. The doses should be obtained at least 2 months apart.  Microbiologists working with certain meningococcal bacteria, military recruits, people at risk during an outbreak, and people who travel to or live in countries with a high rate of meningitis should be immunized. A first-year college student up through age   21 years who is living in a residence [place] should receive a dose if she did not receive a dose on or after her 16th birthday. Adults who have certain high-risk conditions should receive one or more doses of vaccine.  Hepatitis A vaccine. Adults who wish to be protected from this disease, have certain high-risk conditions, work with hepatitis A-infected animals, work in hepatitis A research labs, or travel to or work in countries with a high rate of hepatitis A should be immunized. Adults who were previously unvaccinated and who anticipate close contact with an international adoptee during the first 60 days after arrival in the [place] from a country with a high rate of hepatitis A should be immunized.  Hepatitis B vaccine. Adults who wish to be protected from this disease, have certain high-risk conditions, may be exposed to blood or other infectious body fluids, are household contacts or sex partners of hepatitis B positive people, are clients or workers in certain care facilities, or travel to or work in countries with a high rate of hepatitis B should be immunized.  Haemophilus influenzae type b (Hib) vaccine. A previously unvaccinated person with asplenia or sickle cell disease or having a scheduled splenectomy should receive 1 dose of Hib vaccine. Regardless of previous immunization, a recipient of a hematopoietic stem cell transplant should receive a 3-dose series 6-12 months after her successful transplant. Hib vaccine is not recommended for adults with HIV infection. Preventive Services / Frequency Ages 64 to 68 years  Blood pressure check.** / Every 1 to 2 years.  Lipid and cholesterol check.** / Every 5 years beginning at age  22.  Clinical breast exam.** / Every 3 years for women in their 88s and 53s.  BRCA-related cancer risk assessment.** / For women who have family members with a BRCA-related cancer (breast, ovarian, tubal, or peritoneal cancers).  Pap test.** / Every 2 years from ages 90 through 51. Every 3 years starting at age 21 through age 56 or 3 with a history of 3 consecutive normal Pap tests.  HPV screening.** / Every 3 years from ages 24 through ages 1 to 46 with a history of 3 consecutive normal Pap tests.  Hepatitis C blood test.** / For any individual with known risks for hepatitis C.  Skin self-exam. / Monthly.  Influenza vaccine. / Every year.  Tetanus, diphtheria, and acellular pertussis (Tdap, Td) vaccine.** / Consult your health care provider. Pregnant women should receive 1 dose of Tdap vaccine during each pregnancy. 1 dose of Td every 10 years.  Varicella vaccine.** / Consult your health care provider. Pregnant females who do not have evidence of immunity should receive the first dose after pregnancy.  HPV vaccine. / 3 doses over 6 months, if 72 and younger. The vaccine is not recommended for use in pregnant females. However, pregnancy testing is not needed before receiving a dose.  Measles, mumps, rubella (MMR) vaccine.** / You need at least 1 dose of MMR if you were born in 1957 or later. You may also need a 2nd dose. For females of childbearing age, rubella immunity should be determined. If there is no evidence of immunity, females who are not pregnant should be vaccinated. If there is no evidence of immunity, females who are pregnant should delay immunization until after pregnancy.  Pneumococcal 13-valent conjugate (PCV13) vaccine.** / Consult your health care provider.  Pneumococcal polysaccharide (PPSV23) vaccine.** / 1 to 2 doses if you smoke cigarettes or if you have certain conditions.  Meningococcal vaccine.** /  1 dose if you are age 19 to 21 years and a first-year college  student living in a residence [place], or have one of several medical conditions, you need to get vaccinated against meningococcal disease. You may also need additional booster doses.  Hepatitis A vaccine.** / Consult your health care provider.  Hepatitis B vaccine.** / Consult your health care provider.  Haemophilus influenzae type b (Hib) vaccine.** / Consult your health care provider. Ages 40 to 64 years  Blood pressure check.** / Every 1 to 2 years.  Lipid and cholesterol check.** / Every 5 years beginning at age 20 years.  Lung cancer screening. / Every year if you are aged 55-80 years and have a 30-pack-year history of smoking and currently smoke or have quit within the past 15 years. Yearly screening is stopped once you have quit smoking for at least 15 years or develop a health problem that would prevent you from having lung cancer treatment.  Clinical breast exam.** / Every year after age 40 years.  BRCA-related cancer risk assessment.** / For women who have family members with a BRCA-related cancer (breast, ovarian, tubal, or peritoneal cancers).  Mammogram.** / Every year beginning at age 40 years and continuing for as long as you are in good health. Consult with your health care provider.  Pap test.** / Every 3 years starting at age 30 years through age 65 or 70 years with a history of 3 consecutive normal Pap tests.  HPV screening.** / Every 3 years from ages 30 years through ages 65 to 70 years with a history of 3 consecutive normal Pap tests.  Fecal occult blood test (FOBT) of stool. / Every year beginning at age 50 years and continuing until age 75 years. You may not need to do this test if you get a colonoscopy every 10 years.  Flexible sigmoidoscopy or colonoscopy.** / Every 5 years for a flexible sigmoidoscopy or every 10 years for a colonoscopy beginning at age 50 years and continuing until age 75 years.  Hepatitis C blood test.** / For all people born from 1945 through  1965 and any individual with known risks for hepatitis C.  Skin self-exam. / Monthly.  Influenza vaccine. / Every year.  Tetanus, diphtheria, and acellular pertussis (Tdap/Td) vaccine.** / Consult your health care provider. Pregnant women should receive 1 dose of Tdap vaccine during each pregnancy. 1 dose of Td every 10 years.  Varicella vaccine.** / Consult your health care provider. Pregnant females who do not have evidence of immunity should receive the first dose after pregnancy.  Zoster vaccine.** / 1 dose for adults aged 60 years or older.  Measles, mumps, rubella (MMR) vaccine.** / You need at least 1 dose of MMR if you were born in 1957 or later. You may also need a 2nd dose. For females of childbearing age, rubella immunity should be determined. If there is no evidence of immunity, females who are not pregnant should be vaccinated. If there is no evidence of immunity, females who are pregnant should delay immunization until after pregnancy.  Pneumococcal 13-valent conjugate (PCV13) vaccine.** / Consult your health care provider.  Pneumococcal polysaccharide (PPSV23) vaccine.** / 1 to 2 doses if you smoke cigarettes or if you have certain conditions.  Meningococcal vaccine.** / Consult your health care provider.  Hepatitis A vaccine.** / Consult your health care provider.  Hepatitis B vaccine.** / Consult your health care provider.  Haemophilus influenzae type b (Hib) vaccine.** / Consult your health care provider. Ages 65   years and over  Blood pressure check.** / Every 1 to 2 years.  Lipid and cholesterol check.** / Every 5 years beginning at age 22 years.  Lung cancer screening. / Every year if you are aged 73-80 years and have a 30-pack-year history of smoking and currently smoke or have quit within the past 15 years. Yearly screening is stopped once you have quit smoking for at least 15 years or develop a health problem that would prevent you from having lung cancer  treatment.  Clinical breast exam.** / Every year after age 4 years.  BRCA-related cancer risk assessment.** / For women who have family members with a BRCA-related cancer (breast, ovarian, tubal, or peritoneal cancers).  Mammogram.** / Every year beginning at age 40 years and continuing for as long as you are in good health. Consult with your health care provider.  Pap test.** / Every 3 years starting at age 9 years through age 34 or 91 years with 3 consecutive normal Pap tests. Testing can be stopped between 65 and 70 years with 3 consecutive normal Pap tests and no abnormal Pap or HPV tests in the past 10 years.  HPV screening.** / Every 3 years from ages 57 years through ages 64 or 45 years with a history of 3 consecutive normal Pap tests. Testing can be stopped between 65 and 70 years with 3 consecutive normal Pap tests and no abnormal Pap or HPV tests in the past 10 years.  Fecal occult blood test (FOBT) of stool. / Every year beginning at age 15 years and continuing until age 17 years. You may not need to do this test if you get a colonoscopy every 10 years.  Flexible sigmoidoscopy or colonoscopy.** / Every 5 years for a flexible sigmoidoscopy or every 10 years for a colonoscopy beginning at age 86 years and continuing until age 71 years.  Hepatitis C blood test.** / For all people born from [birth year] through 1963 and any individual with known risks for hepatitis C.  Osteoporosis screening.** / A one-time screening for women ages 83 years and over and women at risk for fractures or osteoporosis.  Skin self-exam. / Monthly.  Influenza vaccine. / Every year.  Tetanus, diphtheria, and acellular pertussis (Tdap/Td) vaccine.** / 1 dose of Td every 10 years.  Varicella vaccine.** / Consult your health care provider.  Zoster vaccine.** / 1 dose for adults aged 61 years or older.  Pneumococcal 13-valent conjugate (PCV13) vaccine.** / Consult your health care provider.  Pneumococcal  polysaccharide (PPSV23) vaccine.** / 1 dose for all adults aged 28 years and older.  Meningococcal vaccine.** / Consult your health care provider.  Hepatitis A vaccine.** / Consult your health care provider.  Hepatitis B vaccine.** / Consult your health care provider.  Haemophilus influenzae type b (Hib) vaccine.** / Consult your health care provider. ** Family history and personal history of risk and conditions may change your health care provider's recommendations. Document Released: 09/20/2001 Document Revised: 12/09/2013 Document Reviewed: 12/20/2010 [facility] Patient Information 2015 [place], [place]. This information is not intended to replace advice given to you by your health care provider. Make sure you discuss any questions you have with your health care provider.

## 2014-10-25 NOTE — Assessment & Plan Note (Signed)
She sees GYN annually Exam done Vaccines were reviewed and updated Labs ordered Pt ed material was given

## 2014-10-25 NOTE — Assessment & Plan Note (Signed)
Will treat with zyrtec and nasonex ns

## 2015-10-07 ENCOUNTER — Ambulatory Visit: Payer: 59 | Admitting: Internal Medicine

## 2015-11-17 ENCOUNTER — Ambulatory Visit (INDEPENDENT_AMBULATORY_CARE_PROVIDER_SITE_OTHER): Payer: 59 | Admitting: Internal Medicine

## 2015-11-17 ENCOUNTER — Encounter: Payer: Self-pay | Admitting: Internal Medicine

## 2015-11-17 ENCOUNTER — Other Ambulatory Visit (INDEPENDENT_AMBULATORY_CARE_PROVIDER_SITE_OTHER): Payer: 59

## 2015-11-17 VITALS — BP 120/80 | HR 76 | Temp 97.6°F | Resp 16 | Ht 64.0 in | Wt 161.0 lb

## 2015-11-17 DIAGNOSIS — Z0000 Encounter for general adult medical examination without abnormal findings: Secondary | ICD-10-CM | POA: Diagnosis not present

## 2015-11-17 DIAGNOSIS — J302 Other seasonal allergic rhinitis: Secondary | ICD-10-CM

## 2015-11-17 LAB — LIPID PANEL
CHOL/HDL RATIO: 3
Cholesterol: 182 mg/dL (ref 0–200)
HDL: 62.6 mg/dL (ref >39.00)
LDL CALC: 110 mg/dL — AB (ref 0–99)
NonHDL: 119.86
Triglycerides: 48 mg/dL (ref 0.0–149.0)
VLDL: 9.6 mg/dL (ref 0.0–40.0)

## 2015-11-17 LAB — CBC WITH DIFFERENTIAL/PLATELET
BASOS PCT: 0.5 % (ref 0.0–3.0)
Basophils Absolute: 0 10*3/uL (ref 0.0–0.1)
EOS ABS: 0.2 10*3/uL (ref 0.0–0.7)
EOS PCT: 2.9 % (ref 0.0–5.0)
HEMATOCRIT: 40.1 % (ref 36.0–46.0)
Hemoglobin: 13.3 g/dL (ref 12.0–15.0)
LYMPHS PCT: 40.7 % (ref 12.0–46.0)
Lymphs Abs: 2.5 10*3/uL (ref 0.7–4.0)
MCHC: 33.3 g/dL (ref 30.0–36.0)
MCV: 89.6 fl (ref 78.0–100.0)
Monocytes Absolute: 0.5 10*3/uL (ref 0.1–1.0)
Monocytes Relative: 8.3 % (ref 3.0–12.0)
NEUTROS ABS: 3 10*3/uL (ref 1.4–7.7)
Neutrophils Relative %: 47.6 % (ref 43.0–77.0)
PLATELETS: 278 10*3/uL (ref 150.0–400.0)
RBC: 4.48 Mil/uL (ref 3.87–5.11)
RDW: 14.2 % (ref 11.5–15.5)
WBC: 6.2 10*3/uL (ref 4.0–10.5)

## 2015-11-17 LAB — COMPREHENSIVE METABOLIC PANEL
ALT: 9 U/L (ref 0–35)
AST: 13 U/L (ref 0–37)
Albumin: 3.8 g/dL (ref 3.5–5.2)
Alkaline Phosphatase: 44 U/L (ref 39–117)
BUN: 8 mg/dL (ref 6–23)
CALCIUM: 8.8 mg/dL (ref 8.4–10.5)
CHLORIDE: 107 meq/L (ref 96–112)
CO2: 26 meq/L (ref 19–32)
CREATININE: 0.8 mg/dL (ref 0.40–1.20)
GFR: 103.13 mL/min (ref >60.00)
Glucose, Bld: 84 mg/dL (ref 70–99)
Potassium: 4 mEq/L (ref 3.5–5.1)
SODIUM: 138 meq/L (ref 135–145)
Total Bilirubin: 0.4 mg/dL (ref 0.2–1.2)
Total Protein: 7 g/dL (ref 6.0–8.3)

## 2015-11-17 LAB — TSH: TSH: 1.19 u[IU]/mL (ref 0.35–4.50)

## 2015-11-17 MED ORDER — CETIRIZINE HCL 10 MG PO TABS: 10.0000 mg | ORAL_TABLET | Freq: Every day | ORAL | Status: DC

## 2015-11-17 MED ORDER — TRIAMCINOLONE ACETONIDE 55 MCG/ACT NA AERO: 4.0000 | INHALATION_SPRAY | Freq: Every day | NASAL | Status: DC

## 2015-11-17 NOTE — Patient Instructions (Signed)
Preventive Care for Adults, Female A healthy lifestyle and preventive care can promote health and wellness. Preventive health guidelines for women include the following key practices.  A routine yearly physical is a good way to check with your health care provider about your health and preventive screening. It is a chance to share any concerns and updates on your health and to receive a thorough exam.  Visit your dentist for a routine exam and preventive care every 6 months. Brush your teeth twice a day and floss once a day. Good oral hygiene prevents tooth decay and gum disease.  The frequency of eye exams is based on your age, health, family medical history, use of contact lenses, and other factors. Follow your health care provider's recommendations for frequency of eye exams.  Eat a healthy diet. Foods like vegetables, fruits, whole grains, low-fat dairy products, and lean protein foods contain the nutrients you need without too many calories. Decrease your intake of foods high in solid fats, added sugars, and salt. Eat the right amount of calories for you.Get information about a proper diet from your health care provider, if necessary.  Regular physical exercise is one of the most important things you can do for your health. Most adults should get at least 150 minutes of moderate-intensity exercise (any activity that increases your heart rate and causes you to sweat) each week. In addition, most adults need muscle-strengthening exercises on 2 or more days a week.  Maintain a healthy weight. The body mass index (BMI) is a screening tool to identify possible weight problems. It provides an estimate of body fat based on height and weight. Your health care provider can find your BMI and can help you achieve or maintain a healthy weight.For adults 20 years and older:  A BMI below 18.5 is considered underweight.  A BMI of 18.5 to 24.9 is normal.  A BMI of 25 to 29.9 is considered overweight.  A  BMI of 30 and above is considered obese.  Maintain normal blood lipids and cholesterol levels by exercising and minimizing your intake of saturated fat. Eat a balanced diet with plenty of fruit and vegetables. Blood tests for lipids and cholesterol should begin at age 45 and be repeated every 5 years. If your lipid or cholesterol levels are high, you are over 50, or you are at high risk for heart disease, you may need your cholesterol levels checked more frequently.Ongoing high lipid and cholesterol levels should be treated with medicines if diet and exercise are not working.  If you smoke, find out from your health care provider how to quit. If you do not use tobacco, do not start.  Lung cancer screening is recommended for adults aged 45-80 years who are at high risk for developing lung cancer because of a history of smoking. A yearly low-dose CT scan of the lungs is recommended for people who have at least a 30-pack-year history of smoking and are a current smoker or have quit within the past 15 years. A pack year of smoking is smoking an average of 1 pack of cigarettes a day for 1 year (for example: 1 pack a day for 30 years or 2 packs a day for 15 years). Yearly screening should continue until the smoker has stopped smoking for at least 15 years. Yearly screening should be stopped for people who develop a health problem that would prevent them from having lung cancer treatment.  If you are pregnant, do not drink alcohol. If you are  breastfeeding, be very cautious about drinking alcohol. If you are not pregnant and choose to drink alcohol, do not have more than 1 drink per day. One drink is considered to be 12 ounces (355 mL) of beer, 5 ounces (148 mL) of wine, or 1.5 ounces (44 mL) of liquor.  Avoid use of street drugs. Do not share needles with anyone. Ask for help if you need support or instructions about stopping the use of drugs.  High blood pressure causes heart disease and increases the risk  of stroke. Your blood pressure should be checked at least every 1 to 2 years. Ongoing high blood pressure should be treated with medicines if weight loss and exercise do not work.  If you are 55-79 years old, ask your health care provider if you should take aspirin to prevent strokes.  Diabetes screening is done by taking a blood sample to check your blood glucose level after you have not eaten for a certain period of time (fasting). If you are not overweight and you do not have risk factors for diabetes, you should be screened once every 3 years starting at age 45. If you are overweight or obese and you are 40-70 years of age, you should be screened for diabetes every year as part of your cardiovascular risk assessment.  Breast cancer screening is essential preventive care for women. You should practice "breast self-awareness." This means understanding the normal appearance and feel of your breasts and may include breast self-examination. Any changes detected, no matter how small, should be reported to a health care provider. Women in their 20s and 30s should have a clinical breast exam (CBE) by a health care provider as part of a regular health exam every 1 to 3 years. After age 40, women should have a CBE every year. Starting at age 40, women should consider having a mammogram (breast X-ray test) every year. Women who have a family history of breast cancer should talk to their health care provider about genetic screening. Women at a high risk of breast cancer should talk to their health care providers about having an MRI and a mammogram every year.  Breast cancer gene (BRCA)-related cancer risk assessment is recommended for women who have family members with BRCA-related cancers. BRCA-related cancers include breast, ovarian, tubal, and peritoneal cancers. Having family members with these cancers may be associated with an increased risk for harmful changes (mutations) in the breast cancer genes BRCA1 and  BRCA2. Results of the assessment will determine the need for genetic counseling and BRCA1 and BRCA2 testing.  Your health care provider may recommend that you be screened regularly for cancer of the pelvic organs (ovaries, uterus, and vagina). This screening involves a pelvic examination, including checking for microscopic changes to the surface of your cervix (Pap test). You may be encouraged to have this screening done every 3 years, beginning at age 21.  For women ages 30-65, health care providers may recommend pelvic exams and Pap testing every 3 years, or they may recommend the Pap and pelvic exam, combined with testing for human papilloma virus (HPV), every 5 years. Some types of HPV increase your risk of cervical cancer. Testing for HPV may also be done on women of any age with unclear Pap test results.  Other health care providers may not recommend any screening for nonpregnant women who are considered low risk for pelvic cancer and who do not have symptoms. Ask your health care provider if a screening pelvic exam is right for   you.  If you have had past treatment for cervical cancer or a condition that could lead to cancer, you need Pap tests and screening for cancer for at least 20 years after your treatment. If Pap tests have been discontinued, your risk factors (such as having a new sexual partner) need to be reassessed to determine if screening should resume. Some women have medical problems that increase the chance of getting cervical cancer. In these cases, your health care provider may recommend more frequent screening and Pap tests.  Colorectal cancer can be detected and often prevented. Most routine colorectal cancer screening begins at the age of 50 years and continues through age 75 years. However, your health care provider may recommend screening at an earlier age if you have risk factors for colon cancer. On a yearly basis, your health care provider may provide home test kits to check  for hidden blood in the stool. Use of a small camera at the end of a tube, to directly examine the colon (sigmoidoscopy or colonoscopy), can detect the earliest forms of colorectal cancer. Talk to your health care provider about this at age 50, when routine screening begins. Direct exam of the colon should be repeated every 5-10 years through age 75 years, unless early forms of precancerous polyps or small growths are found.  People who are at an increased risk for hepatitis B should be screened for this virus. You are considered at high risk for hepatitis B if:  You were born in a country where hepatitis B occurs often. Talk with your health care provider about which countries are considered high risk.  Your parents were born in a high-risk country and you have not received a shot to protect against hepatitis B (hepatitis B vaccine).  You have HIV or AIDS.  You use needles to inject street drugs.  You live with, or have sex with, someone who has hepatitis B.  You get hemodialysis treatment.  You take certain medicines for conditions like cancer, organ transplantation, and autoimmune conditions.  Hepatitis C blood testing is recommended for all people born from 1945 through 1965 and any individual with known risks for hepatitis C.  Practice safe sex. Use condoms and avoid high-risk sexual practices to reduce the spread of sexually transmitted infections (STIs). STIs include gonorrhea, chlamydia, syphilis, trichomonas, herpes, HPV, and human immunodeficiency virus (HIV). Herpes, HIV, and HPV are viral illnesses that have no cure. They can result in disability, cancer, and death.  You should be screened for sexually transmitted illnesses (STIs) including gonorrhea and chlamydia if:  You are sexually active and are younger than 24 years.  You are older than 24 years and your health care provider tells you that you are at risk for this type of infection.  Your sexual activity has changed  since you were last screened and you are at an increased risk for chlamydia or gonorrhea. Ask your health care provider if you are at risk.  If you are at risk of being infected with HIV, it is recommended that you take a prescription medicine daily to prevent HIV infection. This is called preexposure prophylaxis (PrEP). You are considered at risk if:  You are sexually active and do not regularly use condoms or know the HIV status of your partner(s).  You take drugs by injection.  You are sexually active with a partner who has HIV.  Talk with your health care provider about whether you are at high risk of being infected with HIV. If   you choose to begin PrEP, you should first be tested for HIV. You should then be tested every 3 months for as long as you are taking PrEP.  Osteoporosis is a disease in which the bones lose minerals and strength with aging. This can result in serious bone fractures or breaks. The risk of osteoporosis can be identified using a bone density scan. Women ages 67 years and over and women at risk for fractures or osteoporosis should discuss screening with their health care providers. Ask your health care provider whether you should take a calcium supplement or vitamin D to reduce the rate of osteoporosis.  Menopause can be associated with physical symptoms and risks. Hormone replacement therapy is available to decrease symptoms and risks. You should talk to your health care provider about whether hormone replacement therapy is right for you.  Use sunscreen. Apply sunscreen liberally and repeatedly throughout the day. You should seek shade when your shadow is shorter than you. Protect yourself by wearing long sleeves, pants, a wide-brimmed hat, and sunglasses year round, whenever you are outdoors.  Once a month, do a whole body skin exam, using a mirror to look at the skin on your back. Tell your health care provider of new moles, moles that have irregular borders, moles that  are larger than a pencil eraser, or moles that have changed in shape or color.  Stay current with required vaccines (immunizations).  Influenza vaccine. All adults should be immunized every year.  Tetanus, diphtheria, and acellular pertussis (Td, Tdap) vaccine. Pregnant women should receive 1 dose of Tdap vaccine during each pregnancy. The dose should be obtained regardless of the length of time since the last dose. Immunization is preferred during the 27th-36th week of gestation. An adult who has not previously received Tdap or who does not know her vaccine status should receive 1 dose of Tdap. This initial dose should be followed by tetanus and diphtheria toxoids (Td) booster doses every 10 years. Adults with an unknown or incomplete history of completing a 3-dose immunization series with Td-containing vaccines should begin or complete a primary immunization series including a Tdap dose. Adults should receive a Td booster every 10 years.  Varicella vaccine. An adult without evidence of immunity to varicella should receive 2 doses or a second dose if she has previously received 1 dose. Pregnant females who do not have evidence of immunity should receive the first dose after pregnancy. This first dose should be obtained before leaving the health care facility. The second dose should be obtained 4-8 weeks after the first dose.  Human papillomavirus (HPV) vaccine. Females aged 13-26 years who have not received the vaccine previously should obtain the 3-dose series. The vaccine is not recommended for use in pregnant females. However, pregnancy testing is not needed before receiving a dose. If a female is found to be pregnant after receiving a dose, no treatment is needed. In that case, the remaining doses should be delayed until after the pregnancy. Immunization is recommended for any person with an immunocompromised condition through the age of 61 years if she did not get any or all doses earlier. During the  3-dose series, the second dose should be obtained 4-8 weeks after the first dose. The third dose should be obtained 24 weeks after the first dose and 16 weeks after the second dose.  Zoster vaccine. One dose is recommended for adults aged 30 years or older unless certain conditions are present.  Measles, mumps, and rubella (MMR) vaccine. Adults born  before 1957 generally are considered immune to measles and mumps. Adults born in 1957 or later should have 1 or more doses of MMR vaccine unless there is a contraindication to the vaccine or there is laboratory evidence of immunity to each of the three diseases. A routine second dose of MMR vaccine should be obtained at least 28 days after the first dose for students attending postsecondary schools, health care workers, or international travelers. People who received inactivated measles vaccine or an unknown type of measles vaccine during 1963-1967 should receive 2 doses of MMR vaccine. People who received inactivated mumps vaccine or an unknown type of mumps vaccine before 1979 and are at high risk for mumps infection should consider immunization with 2 doses of MMR vaccine. For females of childbearing age, rubella immunity should be determined. If there is no evidence of immunity, females who are not pregnant should be vaccinated. If there is no evidence of immunity, females who are pregnant should delay immunization until after pregnancy. Unvaccinated health care workers born before 1957 who lack laboratory evidence of measles, mumps, or rubella immunity or laboratory confirmation of disease should consider measles and mumps immunization with 2 doses of MMR vaccine or rubella immunization with 1 dose of MMR vaccine.  Pneumococcal 13-valent conjugate (PCV13) vaccine. When indicated, a person who is uncertain of his immunization history and has no record of immunization should receive the PCV13 vaccine. All adults 65 years of age and older should receive this  vaccine. An adult aged 19 years or older who has certain medical conditions and has not been previously immunized should receive 1 dose of PCV13 vaccine. This PCV13 should be followed with a dose of pneumococcal polysaccharide (PPSV23) vaccine. Adults who are at high risk for pneumococcal disease should obtain the PPSV23 vaccine at least 8 weeks after the dose of PCV13 vaccine. Adults older than 38 years of age who have normal immune system function should obtain the PPSV23 vaccine dose at least 1 year after the dose of PCV13 vaccine.  Pneumococcal polysaccharide (PPSV23) vaccine. When PCV13 is also indicated, PCV13 should be obtained first. All adults aged 65 years and older should be immunized. An adult younger than age 65 years who has certain medical conditions should be immunized. Any person who resides in a nursing home or long-term care facility should be immunized. An adult smoker should be immunized. People with an immunocompromised condition and certain other conditions should receive both PCV13 and PPSV23 vaccines. People with human immunodeficiency virus (HIV) infection should be immunized as soon as possible after diagnosis. Immunization during chemotherapy or radiation therapy should be avoided. Routine use of PPSV23 vaccine is not recommended for American Indians, [place] Natives, or people younger than 65 years unless there are medical conditions that require PPSV23 vaccine. When indicated, people who have unknown immunization and have no record of immunization should receive PPSV23 vaccine. One-time revaccination 5 years after the first dose of PPSV23 is recommended for people aged 19-64 years who have chronic kidney failure, nephrotic syndrome, asplenia, or immunocompromised conditions. People who received 1-2 doses of PPSV23 before age 65 years should receive another dose of PPSV23 vaccine at age 65 years or later if at least 5 years have passed since the previous dose. Doses of PPSV23 are not  needed for people immunized with PPSV23 at or after age 65 years.  Meningococcal vaccine. Adults with asplenia or persistent complement component deficiencies should receive 2 doses of quadrivalent meningococcal conjugate (MenACWY-D) vaccine. The doses should be obtained   at least 2 months apart. Microbiologists working with certain meningococcal bacteria, [place] recruits, people at risk during an outbreak, and people who travel to or live in countries with a high rate of meningitis should be immunized. A first-year college student up through age 34 years who is living in a residence [place] should receive a dose if she did not receive a dose on or after her 16th birthday. Adults who have certain high-risk conditions should receive one or more doses of vaccine.  Hepatitis A vaccine. Adults who wish to be protected from this disease, have certain high-risk conditions, work with hepatitis A-infected animals, work in hepatitis A research labs, or travel to or work in countries with a high rate of hepatitis A should be immunized. Adults who were previously unvaccinated and who anticipate close contact with an international adoptee during the first 60 days after arrival in the [place] from a country with a high rate of hepatitis A should be immunized.  Hepatitis B vaccine. Adults who wish to be protected from this disease, have certain high-risk conditions, may be exposed to blood or other infectious body fluids, are household contacts or sex partners of hepatitis B positive people, are clients or workers in certain care facilities, or travel to or work in countries with a high rate of hepatitis B should be immunized.  Haemophilus influenzae type b (Hib) vaccine. A previously unvaccinated person with asplenia or sickle cell disease or having a scheduled splenectomy should receive 1 dose of Hib vaccine. Regardless of previous immunization, a recipient of a hematopoietic stem cell transplant should receive a  3-dose series 6-12 months after her successful transplant. Hib vaccine is not recommended for adults with HIV infection. Preventive Services / Frequency Ages 35 to 4 years  Blood pressure check.** / Every 3-5 years.  Lipid and cholesterol check.** / Every 5 years beginning at age 60.  Clinical breast exam.** / Every 3 years for women in their 71s and 10s.  BRCA-related cancer risk assessment.** / For women who have family members with a BRCA-related cancer (breast, ovarian, tubal, or peritoneal cancers).  Pap test.** / Every 2 years from ages 76 through 26. Every 3 years starting at age 61 through age 76 or 93 with a history of 3 consecutive normal Pap tests.  HPV screening.** / Every 3 years from ages 37 through ages 60 to 51 with a history of 3 consecutive normal Pap tests.  Hepatitis C blood test.** / For any individual with known risks for hepatitis C.  Skin self-exam. / Monthly.  Influenza vaccine. / Every year.  Tetanus, diphtheria, and acellular pertussis (Tdap, Td) vaccine.** / Consult your health care provider. Pregnant women should receive 1 dose of Tdap vaccine during each pregnancy. 1 dose of Td every 10 years.  Varicella vaccine.** / Consult your health care provider. Pregnant females who do not have evidence of immunity should receive the first dose after pregnancy.  HPV vaccine. / 3 doses over 6 months, if 93 and younger. The vaccine is not recommended for use in pregnant females. However, pregnancy testing is not needed before receiving a dose.  Measles, mumps, rubella (MMR) vaccine.** / You need at least 1 dose of MMR if you were born in 1957 or later. You may also need a 2nd dose. For females of childbearing age, rubella immunity should be determined. If there is no evidence of immunity, females who are not pregnant should be vaccinated. If there is no evidence of immunity, females who are  pregnant should delay immunization until after pregnancy.  Pneumococcal  13-valent conjugate (PCV13) vaccine.** / Consult your health care provider.  Pneumococcal polysaccharide (PPSV23) vaccine.** / 1 to 2 doses if you smoke cigarettes or if you have certain conditions.  Meningococcal vaccine.** / 1 dose if you are age 68 to 8 years and a Market researcher living in a residence [place], or have one of several medical conditions, you need to get vaccinated against meningococcal disease. You may also need additional booster doses.  Hepatitis A vaccine.** / Consult your health care provider.  Hepatitis B vaccine.** / Consult your health care provider.  Haemophilus influenzae type b (Hib) vaccine.** / Consult your health care provider. Ages 7 to 53 years  Blood pressure check.** / Every year.  Lipid and cholesterol check.** / Every 5 years beginning at age 25 years.  Lung cancer screening. / Every year if you are aged 11-80 years and have a 30-pack-year history of smoking and currently smoke or have quit within the past 15 years. Yearly screening is stopped once you have quit smoking for at least 15 years or develop a health problem that would prevent you from having lung cancer treatment.  Clinical breast exam.** / Every year after age 48 years.  BRCA-related cancer risk assessment.** / For women who have family members with a BRCA-related cancer (breast, ovarian, tubal, or peritoneal cancers).  Mammogram.** / Every year beginning at age 41 years and continuing for as long as you are in good health. Consult with your health care provider.  Pap test.** / Every 3 years starting at age 65 years through age 37 or 70 years with a history of 3 consecutive normal Pap tests.  HPV screening.** / Every 3 years from ages 72 years through ages 60 to 40 years with a history of 3 consecutive normal Pap tests.  Fecal occult blood test (FOBT) of stool. / Every year beginning at age 21 years and continuing until age 5 years. You may not need to do this test if you get  a colonoscopy every 10 years.  Flexible sigmoidoscopy or colonoscopy.** / Every 5 years for a flexible sigmoidoscopy or every 10 years for a colonoscopy beginning at age 35 years and continuing until age 48 years.  Hepatitis C blood test.** / For all people born from [birth year] through 1965 and any individual with known risks for hepatitis C.  Skin self-exam. / Monthly.  Influenza vaccine. / Every year.  Tetanus, diphtheria, and acellular pertussis (Tdap/Td) vaccine.** / Consult your health care provider. Pregnant women should receive 1 dose of Tdap vaccine during each pregnancy. 1 dose of Td every 10 years.  Varicella vaccine.** / Consult your health care provider. Pregnant females who do not have evidence of immunity should receive the first dose after pregnancy.  Zoster vaccine.** / 1 dose for adults aged 30 years or older.  Measles, mumps, rubella (MMR) vaccine.** / You need at least 1 dose of MMR if you were born in 1957 or later. You may also need a second dose. For females of childbearing age, rubella immunity should be determined. If there is no evidence of immunity, females who are not pregnant should be vaccinated. If there is no evidence of immunity, females who are pregnant should delay immunization until after pregnancy.  Pneumococcal 13-valent conjugate (PCV13) vaccine.** / Consult your health care provider.  Pneumococcal polysaccharide (PPSV23) vaccine.** / 1 to 2 doses if you smoke cigarettes or if you have certain conditions.  Meningococcal vaccine.** /  Consult your health care provider.  Hepatitis A vaccine.** / Consult your health care provider.  Hepatitis B vaccine.** / Consult your health care provider.  Haemophilus influenzae type b (Hib) vaccine.** / Consult your health care provider. Ages 64 years and over  Blood pressure check.** / Every year.  Lipid and cholesterol check.** / Every 5 years beginning at age 23 years.  Lung cancer screening. / Every year if you  are aged 16-80 years and have a 30-pack-year history of smoking and currently smoke or have quit within the past 15 years. Yearly screening is stopped once you have quit smoking for at least 15 years or develop a health problem that would prevent you from having lung cancer treatment.  Clinical breast exam.** / Every year after age 74 years.  BRCA-related cancer risk assessment.** / For women who have family members with a BRCA-related cancer (breast, ovarian, tubal, or peritoneal cancers).  Mammogram.** / Every year beginning at age 44 years and continuing for as long as you are in good health. Consult with your health care provider.  Pap test.** / Every 3 years starting at age 58 years through age 22 or 39 years with 3 consecutive normal Pap tests. Testing can be stopped between 65 and 70 years with 3 consecutive normal Pap tests and no abnormal Pap or HPV tests in the past 10 years.  HPV screening.** / Every 3 years from ages 64 years through ages 70 or 61 years with a history of 3 consecutive normal Pap tests. Testing can be stopped between 65 and 70 years with 3 consecutive normal Pap tests and no abnormal Pap or HPV tests in the past 10 years.  Fecal occult blood test (FOBT) of stool. / Every year beginning at age 40 years and continuing until age 27 years. You may not need to do this test if you get a colonoscopy every 10 years.  Flexible sigmoidoscopy or colonoscopy.** / Every 5 years for a flexible sigmoidoscopy or every 10 years for a colonoscopy beginning at age 7 years and continuing until age 32 years.  Hepatitis C blood test.** / For all people born from [birth year] through 1965 and any individual with known risks for hepatitis C.  Osteoporosis screening.** / A one-time screening for women ages 30 years and over and women at risk for fractures or osteoporosis.  Skin self-exam. / Monthly.  Influenza vaccine. / Every year.  Tetanus, diphtheria, and acellular pertussis (Tdap/Td)  vaccine.** / 1 dose of Td every 10 years.  Varicella vaccine.** / Consult your health care provider.  Zoster vaccine.** / 1 dose for adults aged 35 years or older.  Pneumococcal 13-valent conjugate (PCV13) vaccine.** / Consult your health care provider.  Pneumococcal polysaccharide (PPSV23) vaccine.** / 1 dose for all adults aged 46 years and older.  Meningococcal vaccine.** / Consult your health care provider.  Hepatitis A vaccine.** / Consult your health care provider.  Hepatitis B vaccine.** / Consult your health care provider.  Haemophilus influenzae type b (Hib) vaccine.** / Consult your health care provider. ** Family history and personal history of risk and conditions may change your health care provider's recommendations.   This information is not intended to replace advice given to you by your health care provider. Make sure you discuss any questions you have with your health care provider.   Document Released: 09/20/2001 Document Revised: 08/15/2014 Document Reviewed: 12/20/2010 Elsevier Interactive Patient Education [employer].

## 2015-11-17 NOTE — Progress Notes (Signed)
Pre visit review using our clinic review tool, if applicable. No additional management support is needed unless otherwise documented below in the visit note. 

## 2015-11-17 NOTE — Progress Notes (Signed)
Subjective:  Patient ID: Felicia Johnson, female    DOB: 08/17/1975  Age: 40 y.o. MRN: [id number]  CC: Annual Exam and Allergic Rhinitis    HPI Felicia Johnson presents for a complete physical. She complains of a recurrence of nasal allergies with sneezing, congestion and postnasal drip. She requests a refill on Zyrtec and Nasonex nasal spray.  Outpatient Prescriptions Prior to Visit  Medication Sig Dispense Refill  . cetirizine (ZYRTEC) 10 MG tablet Take 1 tablet (10 mg total) by mouth daily. 90 tablet 3  . triamcinolone (NASACORT AQ) 55 MCG/ACT AERO nasal inhaler Place 4 sprays into the nose daily. 3 Inhaler 3   No facility-administered medications prior to visit.    ROS Review of Systems  Constitutional: Negative.  Negative for fever and chills.  HENT: Positive for congestion, postnasal drip and rhinorrhea. Negative for facial swelling, sinus pressure, sore throat, tinnitus and trouble swallowing.   Eyes: Negative.   Respiratory: Negative.  Negative for cough, choking, chest tightness, shortness of breath and stridor.   Cardiovascular: Negative.  Negative for chest pain, palpitations and leg swelling.  Gastrointestinal: Negative.  Negative for vomiting, abdominal pain, diarrhea and blood in stool.  Endocrine: Negative.   Genitourinary: Negative.   Musculoskeletal: Negative.  Negative for myalgias, back pain, joint swelling and arthralgias.  Skin: Negative.   Allergic/Immunologic: Negative.   Neurological: Negative.  Negative for dizziness, tremors, syncope, light-headedness, numbness and headaches.  Hematological: Negative.  Negative for adenopathy. Does not bruise/bleed easily.  Psychiatric/Behavioral: Negative.     Objective:  BP 120/80 mmHg  Pulse 76  Temp(Src) 97.6 F (36.4 C) (Oral)  Resp 16  Ht 5\' 4"  (1.626 m)  Wt 161 lb (73.029 kg)  BMI 27.62 kg/m2  SpO2 97%  BP Readings from Last 3 Encounters:  11/17/15 120/80  10/23/14 118/82  05/10/13 108/74    Wt  Readings from Last 3 Encounters:  11/17/15 161 lb (73.029 kg)  10/23/14 162 lb (73.483 kg)  05/10/13 164 lb (74.39 kg)    Physical Exam  Constitutional: She is oriented to person, place, and time. She appears well-developed and well-nourished. No distress.  HENT:  Mouth/Throat: Oropharynx is clear and moist. No oropharyngeal exudate.  Eyes: Conjunctivae are normal. Right eye exhibits no discharge. Left eye exhibits no discharge. No scleral icterus.  Neck: Normal range of motion. Neck supple. No JVD present. No tracheal deviation present. No thyromegaly present.  Cardiovascular: Normal rate, regular rhythm, normal heart sounds and intact distal pulses.  Exam reveals no gallop and no friction rub.   No murmur heard. Pulmonary/Chest: Effort normal and breath sounds normal. No stridor. No respiratory distress. She has no wheezes. She has no rales. She exhibits no tenderness.  Abdominal: Soft. Bowel sounds are normal. She exhibits no distension and no mass. There is no tenderness. There is no rebound and no guarding.  Musculoskeletal: Normal range of motion. She exhibits no edema or tenderness.  Lymphadenopathy:    She has no cervical adenopathy.  Neurological: She is oriented to person, place, and time.  Skin: Skin is warm and dry. No rash noted. She is not diaphoretic. No erythema. No pallor.  Vitals reviewed.   Lab Results  Component Value Date   WBC 6.2 11/17/2015   HGB 13.3 11/17/2015   HCT 40.1 11/17/2015   PLT 278.0 11/17/2015   GLUCOSE 84 11/17/2015   CHOL 182 11/17/2015   TRIG 48.0 11/17/2015   HDL 62.60 11/17/2015   LDLDIRECT 134.7 05/10/2013  LDLCALC 110* 11/17/2015   ALT 9 11/17/2015   AST 13 11/17/2015   NA 138 11/17/2015   K 4.0 11/17/2015   CL 107 11/17/2015   CREATININE 0.80 11/17/2015   BUN 8 11/17/2015   CO2 26 11/17/2015   TSH 1.19 11/17/2015    No results found.  Assessment & Plan:   Felicia Johnson was seen today for annual exam and allergic rhinitis  .  Diagnoses and all orders for this visit:  Other seasonal allergic rhinitis- she will restart the use of Nasonex nasal spray and Zyrtec -     triamcinolone (NASACORT AQ) 55 MCG/ACT AERO nasal inhaler; Place 4 sprays into the nose daily. -     cetirizine (ZYRTEC) 10 MG tablet; Take 1 tablet (10 mg total) by mouth daily.  Routine general medical examination at a health care facility- vaccines were reviewed and all are up-to-date, exam completed, labs ordered and reviewed, patient education material was given -     Lipid panel; Future -     Comprehensive metabolic panel; Future -     CBC with Differential/Platelet; Future -     TSH; Future  I am having Felicia Johnson maintain her triamcinolone and cetirizine.  Meds ordered this encounter  Medications  . triamcinolone (NASACORT AQ) 55 MCG/ACT AERO nasal inhaler    Sig: Place 4 sprays into the nose daily.    Dispense:  3 Inhaler    Refill:  3  . cetirizine (ZYRTEC) 10 MG tablet    Sig: Take 1 tablet (10 mg total) by mouth daily.    Dispense:  90 tablet    Refill:  3     Follow-up: Return if symptoms worsen or fail to improve.  Scarlette Calico, MD

## 2016-07-11 DIAGNOSIS — Z01419 Encounter for gynecological examination (general) (routine) without abnormal findings: Secondary | ICD-10-CM | POA: Diagnosis not present

## 2016-10-28 ENCOUNTER — Encounter: Payer: Self-pay | Admitting: Internal Medicine

## 2016-10-28 ENCOUNTER — Ambulatory Visit (INDEPENDENT_AMBULATORY_CARE_PROVIDER_SITE_OTHER): Payer: 59 | Admitting: Internal Medicine

## 2016-10-28 ENCOUNTER — Other Ambulatory Visit (INDEPENDENT_AMBULATORY_CARE_PROVIDER_SITE_OTHER): Payer: 59

## 2016-10-28 ENCOUNTER — Telehealth: Payer: Self-pay | Admitting: Internal Medicine

## 2016-10-28 ENCOUNTER — Ambulatory Visit (INDEPENDENT_AMBULATORY_CARE_PROVIDER_SITE_OTHER)
Admission: RE | Admit: 2016-10-28 | Discharge: 2016-10-28 | Disposition: A | Discharge status: Home / Self Care | Payer: 59 | Source: Ambulatory Visit | Attending: Internal Medicine | Admitting: Internal Medicine

## 2016-10-28 VITALS — BP 134/84 | HR 71 | Temp 98.2°F | Ht 63.0 in | Wt 161.0 lb

## 2016-10-28 DIAGNOSIS — R079 Chest pain, unspecified: Secondary | ICD-10-CM

## 2016-10-28 DIAGNOSIS — M79641 Pain in right hand: Secondary | ICD-10-CM | POA: Diagnosis not present

## 2016-10-28 DIAGNOSIS — Z0000 Encounter for general adult medical examination without abnormal findings: Secondary | ICD-10-CM | POA: Diagnosis not present

## 2016-10-28 DIAGNOSIS — M79642 Pain in left hand: Secondary | ICD-10-CM

## 2016-10-28 DIAGNOSIS — R42 Dizziness and giddiness: Secondary | ICD-10-CM | POA: Diagnosis not present

## 2016-10-28 DIAGNOSIS — K219 Gastro-esophageal reflux disease without esophagitis: Secondary | ICD-10-CM | POA: Diagnosis not present

## 2016-10-28 DIAGNOSIS — R202 Paresthesia of skin: Secondary | ICD-10-CM

## 2016-10-28 LAB — CBC WITH DIFFERENTIAL/PLATELET
BASOS PCT: 0.4 % (ref 0.0–3.0)
Basophils Absolute: 0 10*3/uL (ref 0.0–0.1)
EOS PCT: 1.7 % (ref 0.0–5.0)
Eosinophils Absolute: 0.1 10*3/uL (ref 0.0–0.7)
HCT: 41.8 % (ref 36.0–46.0)
HEMOGLOBIN: 13.9 g/dL (ref 12.0–15.0)
Lymphocytes Relative: 41.3 % (ref 12.0–46.0)
Lymphs Abs: 2.5 10*3/uL (ref 0.7–4.0)
MCHC: 33.2 g/dL (ref 30.0–36.0)
MCV: 89.5 fl (ref 78.0–100.0)
MONOS PCT: 8.6 % (ref 3.0–12.0)
Monocytes Absolute: 0.5 10*3/uL (ref 0.1–1.0)
Neutro Abs: 2.9 10*3/uL (ref 1.4–7.7)
Neutrophils Relative %: 48 % (ref 43.0–77.0)
Platelets: 296 10*3/uL (ref 150.0–400.0)
RBC: 4.67 Mil/uL (ref 3.87–5.11)
RDW: 14.3 % (ref 11.5–15.5)
WBC: 6.1 10*3/uL (ref 4.0–10.5)

## 2016-10-28 LAB — BASIC METABOLIC PANEL
BUN: 10 mg/dL (ref 6–23)
CALCIUM: 9.2 mg/dL (ref 8.4–10.5)
CO2: 24 mEq/L (ref 19–32)
Chloride: 105 mEq/L (ref 96–112)
Creatinine, Ser: 0.87 mg/dL (ref 0.40–1.20)
GFR: 93.15 mL/min (ref >60.00)
GLUCOSE: 87 mg/dL (ref 70–99)
Potassium: 3.8 mEq/L (ref 3.5–5.1)
SODIUM: 137 meq/L (ref 135–145)

## 2016-10-28 LAB — TSH: TSH: 1.83 u[IU]/mL (ref 0.35–4.50)

## 2016-10-28 LAB — HEPATIC FUNCTION PANEL
ALBUMIN: 4.2 g/dL (ref 3.5–5.2)
ALK PHOS: 42 U/L (ref 39–117)
ALT: 11 U/L (ref 0–35)
AST: 15 U/L (ref 0–37)
BILIRUBIN TOTAL: 0.4 mg/dL (ref 0.2–1.2)
Bilirubin, Direct: 0.1 mg/dL (ref 0.0–0.3)
Total Protein: 7.5 g/dL (ref 6.0–8.3)

## 2016-10-28 LAB — TROPONIN I: TNIDX: 0 ug/l (ref 0.00–0.06)

## 2016-10-28 MED ORDER — MELOXICAM 15 MG PO TABS: 15.0000 mg | ORAL_TABLET | Freq: Every day | ORAL | 2 refills | Status: DC

## 2016-10-28 MED ORDER — PANTOPRAZOLE SODIUM 40 MG PO TBEC: 40.0000 mg | DELAYED_RELEASE_TABLET | Freq: Every day | ORAL | 3 refills | Status: DC

## 2016-10-28 MED FILL — PANTOPRAZOLE SOD DR 40 MG T: 40 | 90 days supply | Qty: 90 | Fill #0

## 2016-10-28 MED FILL — MELOXICAM 15 MG TABLET: 15 | 30 days supply | Qty: 30 | Fill #0

## 2016-10-28 NOTE — Progress Notes (Signed)
   Subjective:    Patient ID: Felicia Johnson, female    DOB: 1975-11-10, 40 y.o.   MRN: [id number]  HPI   Here with 1 wk intermittent SSCP, dull achy nonpositional nonexertional without specific radiation, n/v, palp or diaphoresis,  No prior hx of CAD/heart disease. Also with pain and swelling to bilat hands mostly the thumb, index and middle finger rays of both hand, with some puffiness and tenderness.  Does also have intermittent mild numb tinglinng of the LUE starting at the hand and radiation proximally to below the elbow, but denies elbow or shoulder or significant neck pain.  Has also had some paresthesias to the right hand but milder, types all day at work for months.  Also had an episode of dizzy lightheaded with some GI upset and heat at church, none since then for several days.  Has also had worsening reflux, abd pain, dysphagia, n/v, bowel change or blood. Past Medical History:  Diagnosis Date  . Hyperlipidemia    Past Surgical History:  Procedure Laterality Date  . TUBAL LIGATION      reports that she has never smoked. She has never used smokeless tobacco. She reports that she does not drink alcohol or use drugs. family history includes Hypertension in her mother; Stroke in her other. No Known Allergies Current Outpatient Prescriptions on File Prior to Visit  Medication Sig Dispense Refill  . cetirizine (ZYRTEC) 10 MG tablet Take 1 tablet (10 mg total) by mouth daily. 90 tablet 3  . triamcinolone (NASACORT AQ) 55 MCG/ACT AERO nasal inhaler Place 4 sprays into the nose daily. 3 Inhaler 3   No current facility-administered medications on file prior to visit.    Review of Systems  Constitutional: Negative for unusual diaphoresis or night sweats HENT: Negative for ear swelling or discharge Eyes: Negative for worsening visual haziness  Respiratory: Negative for choking and stridor.   Gastrointestinal: Negative for distension or worsening eructation Genitourinary: Negative for  retention or change in urine volume.  Musculoskeletal: Negative for other MSK pain or swelling Skin: Negative for color change and worsening wound Neurological: Negative for tremors and numbness other than noted  Psychiatric/Behavioral: Negative for decreased concentration or agitation other than above   All other system neg per pt    Objective:   Physical Exam BP 134/84   Pulse 71   Temp 98.2 F (36.8 C) (Oral)   Ht 5\' 3"  (1.6 m)   Wt 161 lb (73 kg)   SpO2 98%   BMI 28.52 kg/m  VS noted,  Constitutional: Pt appears in no apparent distress HENT: Head: NCAT.  Right Ear: External ear normal.  Left Ear: External ear normal.  Eyes: . Pupils are equal, round, and reactive to light. Conjunctivae and EOM are normal Neck: Normal range of motion. Neck supple.  Cardiovascular: Normal rate and regular rhythm.   Pulmonary/Chest: Effort normal and breath sounds without rales or wheezing.  Abd:  Soft, NT, ND, + BS Neurological: Pt is alert. Not confused , motor grossly intact, cn 2-12 intact, sens/dtr intact Skin: Skin is warm. No rash, no LE edema Psychiatric: Pt behavior is normal. No agitation.  No other exam findings Spine nontender  ECG today I have personally interpreted Sinus  Rhythm  - WNL     Assessment & Plan:

## 2016-10-28 NOTE — Telephone Encounter (Signed)
PLEASE NOTE: All timestamps contained within this report are represented as [place] Standard Time. CONFIDENTIALTY NOTICE: This fax transmission is intended only for the addressee. It contains information that is legally privileged, confidential or otherwise protected from use or disclosure. If you are not the intended recipient, you are strictly prohibited from reviewing, disclosing, copying using or disseminating any of this information or taking any action in reliance on or regarding this information. If you have received this fax in error, please notify us immediately by telephone so that we can arrange for its return to [place]. Phone: [phone], Toll-Free: [phone], Fax: [phone] Page: 1 of 1 Call Id: [id number] Volin Day - Client [place] Patient Name: Felicia Johnson DOB: 1976/05/07 Initial Comment Caller states has been feeling dizzy and light headed, arm feeling heavy on one side. also been having heartburn Nurse Assessment Nurse: Dimas Chyle, RN, Dellis Filbert Date/Time Eilene Ghazi Time): 10/28/2016 9:52:51 AM Confirm and document reason for call. If symptomatic, describe symptoms. ---Caller states has been feeling dizzy and light headed, arm feeling heavy on one side. also been having heartburn. Dizziness started last Sunday but not now. Tingling in arm started last Sunday as well. Tingling comes and goes. Does the patient have any new or worsening symptoms? ---Yes Will a triage be completed? ---Yes Related visit to physician within the last 2 weeks? ---No Does the PT have any chronic conditions? (i.e. diabetes, asthma, etc.) ---No Is the patient pregnant or possibly pregnant? (Ask all females between the ages of 77-55) ---No Is this a behavioral health or substance abuse call? ---No Guidelines Guideline Title Affirmed Question Affirmed Notes Neurologic Deficit [1] Numbness (i.e., loss of sensation) of the face, arm /  hand, or leg / foot on one side of the body AND [2] gradual onset (e.g., days to weeks) AND [3] present now Final Disposition User See Physician within 4 Hours (or PCP triage) Dimas Chyle, RN, Dellis Filbert Referrals REFERRED TO PCP OFFICE Disagree/Comply: Leta Baptist

## 2016-10-28 NOTE — Progress Notes (Signed)
Pre visit review using our clinic review tool, if applicable. No additional management support is needed unless otherwise documented below in the visit note. 

## 2016-10-28 NOTE — Patient Instructions (Signed)
Please take all new medication as prescribed - the anti-inflammatory for the hands, and the protonix for the reflux  Please also wear a left wrist splint (at night only) to help with the tingling;  If the tingling gets worse with pain, you can call for a Nerve test  Your EKG was OK today  Please continue all other medications as before, and refills have been done if requested.  Please have the pharmacy call with any other refills you may need.  Please keep your appointments with your specialists as you may have planned  Please go to the XRAY Department in the Basement (go straight as you get off the elevator) for the x-ray testing  Please go to the LAB in the Basement (turn left off the elevator) for the tests to be done today  You will be contacted by phone if any changes need to be made immediately.  Otherwise, you will receive a letter about your results with an explanation, but please check with MyChart first.  Please remember to sign up for MyChart if you have not done so, as this will be important to you in the future with finding out test results, communicating by private email, and scheduling acute appointments online when needed.

## 2016-10-29 NOTE — Assessment & Plan Note (Signed)
Also with evidence for soft tissue swelling and overuse, for tylenol prn, avoid overuse with typing

## 2016-10-29 NOTE — Assessment & Plan Note (Signed)
I suspect heat and /or vasovagal related, not likely related to current illness, self limited, declines cxr, cont to monitor

## 2016-10-29 NOTE — Assessment & Plan Note (Signed)
Suspect left CTS mild, exam benign, for qhs wrist splint use,  to f/u any worsening symptoms or concerns

## 2016-10-29 NOTE — Assessment & Plan Note (Signed)
.  Mild to mod, for PPI trial, to f/u any worsening symptoms or concerns

## 2016-10-29 NOTE — Assessment & Plan Note (Signed)
Atypical, ecg reviewed, exam benign, have very low suspicion for cardiac, consider stress test but declines for now

## 2017-03-17 ENCOUNTER — Telehealth: Payer: Self-pay | Admitting: Internal Medicine

## 2017-03-17 DIAGNOSIS — J302 Other seasonal allergic rhinitis: Secondary | ICD-10-CM

## 2017-03-17 MED ORDER — CETIRIZINE HCL 10 MG PO TABS: 10.0000 mg | ORAL_TABLET | Freq: Every day | ORAL | 0 refills | Status: DC

## 2017-03-17 NOTE — Telephone Encounter (Signed)
Pt called for a refill of cetirizine (ZYRTEC) 10 MG tablet  CPE set up for 03/29/17 [facility]

## 2017-03-17 NOTE — Telephone Encounter (Signed)
Per office policy sent 30 day to local pharmacy until appt.../lmb  

## 2017-03-29 ENCOUNTER — Encounter: Payer: 59 | Admitting: Internal Medicine

## 2017-04-03 ENCOUNTER — Other Ambulatory Visit (INDEPENDENT_AMBULATORY_CARE_PROVIDER_SITE_OTHER): Payer: 59

## 2017-04-03 ENCOUNTER — Encounter: Payer: Self-pay | Admitting: Internal Medicine

## 2017-04-03 ENCOUNTER — Ambulatory Visit (INDEPENDENT_AMBULATORY_CARE_PROVIDER_SITE_OTHER): Payer: 59 | Admitting: Internal Medicine

## 2017-04-03 VITALS — BP 118/80 | HR 72 | Temp 97.6°F | Ht 63.0 in | Wt 162.0 lb

## 2017-04-03 DIAGNOSIS — J302 Other seasonal allergic rhinitis: Secondary | ICD-10-CM | POA: Diagnosis not present

## 2017-04-03 DIAGNOSIS — J301 Allergic rhinitis due to pollen: Secondary | ICD-10-CM

## 2017-04-03 DIAGNOSIS — Z0000 Encounter for general adult medical examination without abnormal findings: Secondary | ICD-10-CM | POA: Diagnosis not present

## 2017-04-03 LAB — COMPREHENSIVE METABOLIC PANEL
ALT: 10 U/L (ref 0–35)
AST: 15 U/L (ref 0–37)
Albumin: 4.1 g/dL (ref 3.5–5.2)
Alkaline Phosphatase: 40 U/L (ref 39–117)
BILIRUBIN TOTAL: 0.4 mg/dL (ref 0.2–1.2)
BUN: 9 mg/dL (ref 6–23)
CALCIUM: 9 mg/dL (ref 8.4–10.5)
CHLORIDE: 104 meq/L (ref 96–112)
CO2: 29 meq/L (ref 19–32)
Creatinine, Ser: 0.78 mg/dL (ref 0.40–1.20)
GFR: 105.43 mL/min (ref >60.00)
Glucose, Bld: 83 mg/dL (ref 70–99)
POTASSIUM: 3.9 meq/L (ref 3.5–5.1)
Sodium: 136 mEq/L (ref 135–145)
Total Protein: 7.5 g/dL (ref 6.0–8.3)

## 2017-04-03 LAB — LIPID PANEL
CHOL/HDL RATIO: 3
Cholesterol: 212 mg/dL — ABNORMAL HIGH (ref 0–200)
HDL: 66.1 mg/dL (ref >39.00)
LDL CALC: 135 mg/dL — AB (ref 0–99)
NonHDL: 145.64
TRIGLYCERIDES: 55 mg/dL (ref 0.0–149.0)
VLDL: 11 mg/dL (ref 0.0–40.0)

## 2017-04-03 LAB — TSH: TSH: 1.83 u[IU]/mL (ref 0.35–4.50)

## 2017-04-03 LAB — CBC WITH DIFFERENTIAL/PLATELET
BASOS ABS: 0 10*3/uL (ref 0.0–0.1)
Basophils Relative: 0.4 % (ref 0.0–3.0)
Eosinophils Absolute: 0.1 10*3/uL (ref 0.0–0.7)
Eosinophils Relative: 2.3 % (ref 0.0–5.0)
HEMATOCRIT: 41.5 % (ref 36.0–46.0)
Hemoglobin: 13.7 g/dL (ref 12.0–15.0)
LYMPHS PCT: 47.2 % — AB (ref 12.0–46.0)
Lymphs Abs: 2.5 10*3/uL (ref 0.7–4.0)
MCHC: 33 g/dL (ref 30.0–36.0)
MCV: 90.8 fl (ref 78.0–100.0)
MONOS PCT: 9.2 % (ref 3.0–12.0)
Monocytes Absolute: 0.5 10*3/uL (ref 0.1–1.0)
NEUTROS ABS: 2.2 10*3/uL (ref 1.4–7.7)
Neutrophils Relative %: 40.9 % — ABNORMAL LOW (ref 43.0–77.0)
PLATELETS: 288 10*3/uL (ref 150.0–400.0)
RBC: 4.57 Mil/uL (ref 3.87–5.11)
RDW: 14 % (ref 11.5–15.5)
WBC: 5.3 10*3/uL (ref 4.0–10.5)

## 2017-04-03 MED ORDER — CETIRIZINE HCL 10 MG PO TABS: 10.0000 mg | ORAL_TABLET | Freq: Every day | ORAL | 3 refills | Status: DC

## 2017-04-03 MED FILL — PANTOPRAZOLE SOD DR 40 MG T: 40 | 90 days supply | Qty: 90 | Fill #1

## 2017-04-03 NOTE — Progress Notes (Signed)
Subjective:  Patient ID: Felicia Johnson, female    DOB: 01/03/1976  Age: 41 y.o. MRN: [id number]  CC: Annual Exam   HPI HARSHITHA FRETZ presents for a CPX - she feels well and offers no complaints.  Outpatient Medications Prior to Visit  Medication Sig Dispense Refill  . pantoprazole (PROTONIX) 40 MG tablet Take 1 tablet (40 mg total) by mouth daily. 90 tablet 3  . cetirizine (ZYRTEC) 10 MG tablet Take 1 tablet (10 mg total) by mouth daily. Must see provider for future refills 30 tablet 0  . meloxicam (MOBIC) 15 MG tablet Take 1 tablet (15 mg total) by mouth daily. 30 tablet 2  . triamcinolone (NASACORT AQ) 55 MCG/ACT AERO nasal inhaler Place 4 sprays into the nose daily. 3 Inhaler 3   No facility-administered medications prior to visit.     ROS Review of Systems  All other systems reviewed and are negative.   Objective:  BP 118/80 (BP Location: Left Arm, Patient Position: Sitting, Cuff Size: Normal)   Pulse 72   Temp 97.6 F (36.4 C) (Oral)   Ht 5\' 3"  (1.6 m)   Wt 162 lb (73.5 kg)   SpO2 98%   BMI 28.70 kg/m   BP Readings from Last 3 Encounters:  04/03/17 118/80  10/28/16 134/84  11/17/15 120/80    Wt Readings from Last 3 Encounters:  04/03/17 162 lb (73.5 kg)  10/28/16 161 lb (73 kg)  11/17/15 161 lb (73 kg)    Physical Exam  Constitutional: She is oriented to person, place, and time. No distress.  HENT:  Mouth/Throat: Oropharynx is clear and moist. No oropharyngeal exudate.  Eyes: Conjunctivae are normal. Right eye exhibits no discharge. Left eye exhibits no discharge. No scleral icterus.  Neck: Normal range of motion. Neck supple. No JVD present. No thyromegaly present.  Cardiovascular: Normal rate, regular rhythm and intact distal pulses.  Exam reveals no gallop and no friction rub.   No murmur heard. Pulmonary/Chest: Effort normal and breath sounds normal. No respiratory distress. She has no wheezes. She has no rales. She exhibits no tenderness.    Abdominal: Soft. Bowel sounds are normal. She exhibits no distension and no mass. There is no tenderness. There is no rebound and no guarding.  Musculoskeletal: Normal range of motion. She exhibits no edema, tenderness or deformity.  Lymphadenopathy:    She has no cervical adenopathy.  Neurological: She is alert and oriented to person, place, and time.  Skin: Skin is warm and dry. No rash noted. She is not diaphoretic. No erythema. No pallor.  Psychiatric: She has a normal mood and affect. Her behavior is normal. Judgment and thought content normal.  Vitals reviewed.   Lab Results  Component Value Date   WBC 5.3 04/03/2017   HGB 13.7 04/03/2017   HCT 41.5 04/03/2017   PLT 288.0 04/03/2017   GLUCOSE 83 04/03/2017   CHOL 212 (H) 04/03/2017   TRIG 55.0 04/03/2017   HDL 66.10 04/03/2017   LDLDIRECT 134.7 05/10/2013   LDLCALC 135 (H) 04/03/2017   ALT 10 04/03/2017   AST 15 04/03/2017   NA 136 04/03/2017   K 3.9 04/03/2017   CL 104 04/03/2017   CREATININE 0.78 04/03/2017   BUN 9 04/03/2017   CO2 29 04/03/2017   TSH 1.83 04/03/2017    Dg Chest 2 View  Result Date: 10/28/2016 CLINICAL DATA:  left side chest pain for 1 week EXAM: CHEST  2 VIEW COMPARISON:  None. FINDINGS: The heart  size and mediastinal contours are within normal limits. Both lungs are clear. The visualized skeletal structures are unremarkable. IMPRESSION: No active cardiopulmonary disease. Electronically Signed   By: Lahoma Crocker M.D.   On: 10/28/2016 12:19    Assessment & Plan:   Ellina was seen today for annual exam.  Diagnoses and all orders for this visit:  Routine general medical examination at a health care facility- Exam completed, labs reviewed, her PAP smear is up-to-date, vaccines reviewed and updated, patient education material was given. -     Lipid panel; Future -     Comprehensive metabolic panel; Future -     CBC with Differential/Platelet; Future -     TSH; Future  Allergic rhinitis due to  pollen, unspecified seasonality -     cetirizine (ZYRTEC) 10 MG tablet; Take 1 tablet (10 mg total) by mouth daily. Must see provider for future refills  Other seasonal allergic rhinitis   I have discontinued Ms. Cohron's triamcinolone and meloxicam. I am also having her maintain her pantoprazole and cetirizine.  Meds ordered this encounter  Medications  . cetirizine (ZYRTEC) 10 MG tablet    Sig: Take 1 tablet (10 mg total) by mouth daily. Must see provider for future refills    Dispense:  90 tablet    Refill:  3     Follow-up: Return in about 1 year (around 04/03/2018).  Scarlette Calico, MD

## 2017-04-03 NOTE — Patient Instructions (Signed)
Preventive Care 18-39 Years, Female Preventive care refers to lifestyle choices and visits with your health care provider that can promote health and wellness. What does preventive care include?  A yearly physical exam. This is also called an annual well check.  Dental exams once or twice a year.  Routine eye exams. Ask your health care provider how often you should have your eyes checked.  Personal lifestyle choices, including: ? Daily care of your teeth and gums. ? Regular physical activity. ? Eating a healthy diet. ? Avoiding tobacco and drug use. ? Limiting alcohol use. ? Practicing safe sex. ? Taking vitamin and mineral supplements as recommended by your health care provider. What happens during an annual well check? The services and screenings done by your health care provider during your annual well check will depend on your age, overall health, lifestyle risk factors, and family history of disease. Counseling Your health care provider may ask you questions about your:  Alcohol use.  Tobacco use.  Drug use.  Emotional well-being.  Home and relationship well-being.  Sexual activity.  Eating habits.  Work and work Statistician.  Method of birth control.  Menstrual cycle.  Pregnancy history.  Screening You may have the following tests or measurements:  Height, weight, and BMI.  Diabetes screening. This is done by checking your blood sugar (glucose) after you have not eaten for a while (fasting).  Blood pressure.  Lipid and cholesterol levels. These may be checked every 5 years starting at age 66.  Skin check.  Hepatitis C blood test.  Hepatitis B blood test.  Sexually transmitted disease (STD) testing.  BRCA-related cancer screening. This may be done if you have a family history of breast, ovarian, tubal, or peritoneal cancers.  Pelvic exam and Pap test. This may be done every 3 years starting at age 40. Starting at age 59, this may be done every 5  years if you have a Pap test in combination with an HPV test.  Discuss your test results, treatment options, and if necessary, the need for more tests with your health care provider. Vaccines Your health care provider may recommend certain vaccines, such as:  Influenza vaccine. This is recommended every year.  Tetanus, diphtheria, and acellular pertussis (Tdap, Td) vaccine. You may need a Td booster every 10 years.  Varicella vaccine. You may need this if you have not been vaccinated.  HPV vaccine. If you are 69 or younger, you may need three doses over 6 months.  Measles, mumps, and rubella (MMR) vaccine. You may need at least one dose of MMR. You may also need a second dose.  Pneumococcal 13-valent conjugate (PCV13) vaccine. You may need this if you have certain conditions and were not previously vaccinated.  Pneumococcal polysaccharide (PPSV23) vaccine. You may need one or two doses if you smoke cigarettes or if you have certain conditions.  Meningococcal vaccine. One dose is recommended if you are age 27-21 years and a first-year college student living in a residence [place], or if you have one of several medical conditions. You may also need additional booster doses.  Hepatitis A vaccine. You may need this if you have certain conditions or if you travel or work in places where you may be exposed to hepatitis A.  Hepatitis B vaccine. You may need this if you have certain conditions or if you travel or work in places where you may be exposed to hepatitis B.  Haemophilus influenzae type b (Hib) vaccine. You may need this if  you have certain risk factors.  Talk to your health care provider about which screenings and vaccines you need and how often you need them. This information is not intended to replace advice given to you by your health care provider. Make sure you discuss any questions you have with your health care provider. Document Released: 09/20/2001 Document Revised: 04/13/2016  Document Reviewed: 05/26/2015 Elsevier Interactive Patient Education  2017 [employer].

## 2017-08-10 ENCOUNTER — Emergency Department (HOSPITAL_COMMUNITY): Payer: 59

## 2017-08-10 ENCOUNTER — Encounter (HOSPITAL_COMMUNITY): Payer: Self-pay | Admitting: Emergency Medicine

## 2017-08-10 ENCOUNTER — Emergency Department (HOSPITAL_COMMUNITY)
Admission: EM | Admit: 2017-08-10 | Discharge: 2017-08-10 | Disposition: A | Discharge status: Home / Self Care | Payer: 59 | Attending: Emergency Medicine | Admitting: Emergency Medicine

## 2017-08-10 DIAGNOSIS — Z79899 Other long term (current) drug therapy: Secondary | ICD-10-CM | POA: Diagnosis not present

## 2017-08-10 DIAGNOSIS — R0789 Other chest pain: Secondary | ICD-10-CM | POA: Diagnosis not present

## 2017-08-10 DIAGNOSIS — R079 Chest pain, unspecified: Secondary | ICD-10-CM | POA: Diagnosis not present

## 2017-08-10 HISTORY — DX: Gastro-esophageal reflux disease without esophagitis: K21.9

## 2017-08-10 LAB — CBC
HEMATOCRIT: 37.2 % (ref 36.0–46.0)
HEMOGLOBIN: 12.6 g/dL (ref 12.0–15.0)
MCH: 29.7 pg (ref 26.0–34.0)
MCHC: 33.9 g/dL (ref 30.0–36.0)
MCV: 87.7 fL (ref 78.0–100.0)
Platelets: 280 10*3/uL (ref 150–400)
RBC: 4.24 MIL/uL (ref 3.87–5.11)
RDW: 14.3 % (ref 11.5–15.5)
WBC: 5.3 10*3/uL (ref 4.0–10.5)

## 2017-08-10 LAB — I-STAT BETA HCG BLOOD, ED (MC, WL, AP ONLY): I-stat hCG, quantitative: <5 m[IU]/mL (ref <5)

## 2017-08-10 LAB — BASIC METABOLIC PANEL
Anion gap: 6 (ref 5–15)
BUN: 11 mg/dL (ref 6–20)
CALCIUM: 8.5 mg/dL — AB (ref 8.9–10.3)
CO2: 23 mmol/L (ref 22–32)
Chloride: 108 mmol/L (ref 101–111)
Creatinine, Ser: 0.81 mg/dL (ref 0.44–1.00)
GFR calc Af Amer: >60 mL/min (ref >60)
GFR calc non Af Amer: >60 mL/min (ref >60)
GLUCOSE: 98 mg/dL (ref 65–99)
Potassium: 3.7 mmol/L (ref 3.5–5.1)
Sodium: 137 mmol/L (ref 135–145)

## 2017-08-10 LAB — I-STAT TROPONIN, ED: TROPONIN I, POC: 0 ng/mL (ref 0.00–0.08)

## 2017-08-10 NOTE — ED Notes (Signed)
ED Provider at bedside. 

## 2017-08-10 NOTE — ED Provider Notes (Signed)
[facility] DEPT Provider Note   CSN: [id number] Arrival date & time: 08/10/17  9983     History   Chief Complaint Chief Complaint  Patient presents with  . Chest Pain  . Jaw Pain    HPI Felicia Johnson is a 42 y.o. female.  HPI Patient presents with chest pain.  States in her anterior chest and goes to the jaw at times.  Comes and goes.  Has had a the last week and a half.  States she will occasionally feel short of breath with it.  Does not come on with exertion.  Tends be worse in the morning.  States sometimes she feels as if she has to take a deep breath.  No swelling in her legs.  She does not smoke.  No fevers.  No cough.  States she is her primary care doctor for left arm pain 3 months ago that was told could have been carpal tunnel.  No history of early cardiac disease.  No recent long travels. Past Medical History:  Diagnosis Date  . Acid reflux   . Hyperlipidemia     Patient Active Problem List   Diagnosis Date Noted  . Arm paresthesia, left 10/28/2016  . Bilateral hand pain 10/28/2016  . GERD (gastroesophageal reflux disease) 10/28/2016  . Allergic rhinitis 05/10/2013  . Routine general medical examination at a health care facility 10/31/2011  . HYPERLIPIDEMIA 11/03/2008    Past Surgical History:  Procedure Laterality Date  . TUBAL LIGATION      OB History    No data available       Home Medications    Prior to Admission medications   Medication Sig Start Date End Date Taking? Authorizing Provider  cetirizine (ZYRTEC) 10 MG tablet Take 1 tablet (10 mg total) by mouth daily. Must see provider for future refills 04/03/17  Yes Janith Lima, MD  naproxen sodium (ALEVE) 220 MG tablet Take 220 mg by mouth 2 (two) times daily as needed (for pain).   Yes [provider]  pantoprazole (PROTONIX) 40 MG tablet Take 1 tablet (40 mg total) by mouth daily. 10/28/16  Yes Biagio Borg, MD    Family History Family History    Problem Relation Age of Onset  . Hypertension Mother   . Stroke Other   . Cancer Neg Hx   . Heart disease Neg Hx   . Hyperlipidemia Neg Hx   . Diabetes Neg Hx   . Drug abuse Neg Hx   . Alcohol abuse Neg Hx   . Kidney disease Neg Hx     Social History Social History   Tobacco Use  . Smoking status: Never Smoker  . Smokeless tobacco: Never Used  Substance Use Topics  . Alcohol use: No  . Drug use: No     Allergies   Patient has no known allergies.   Review of Systems Review of Systems  Constitutional: Negative for appetite change.  HENT: Negative for congestion.   Respiratory: Positive for shortness of breath.   Cardiovascular: Positive for chest pain. Negative for leg swelling.  Gastrointestinal: Negative for abdominal pain.  Genitourinary: Negative for flank pain.  Musculoskeletal: Negative for back pain.  Skin: Negative for rash and wound.  Neurological: Negative for weakness and light-headedness.  Hematological: Negative for adenopathy.  Psychiatric/Behavioral: Negative for confusion.     Physical Exam Updated Vital Signs BP 115/68   Pulse 60   Temp 98.6 F (37 C) (Oral)   Resp  14   Ht 5\' 3"  (1.6 m)   Wt 72.6 kg (160 lb)   LMP 08/07/2017   SpO2 100%   BMI 28.34 kg/m   Physical Exam  Constitutional: She appears well-developed.  HENT:  Head: Atraumatic.  Cardiovascular: Normal rate and regular rhythm.  No murmur heard. Tenderness to anterior lower mid chest wall.  No crepitance or deformity.  No rash.  Abdominal: There is no tenderness.  Musculoskeletal:       Right lower leg: She exhibits no edema.       Left lower leg: She exhibits no edema.  Neurological: She is not disoriented.  Skin: Skin is warm. Capillary refill takes less than 2 seconds.  Psychiatric: She has a normal mood and affect.     ED Treatments / Results  Labs (all labs ordered are listed, but only abnormal results are displayed) Labs Reviewed  BASIC METABOLIC PANEL -  Abnormal; Notable for the following components:      Result Value   Calcium 8.5 (*)    All other components within normal limits  CBC  I-STAT TROPONIN, ED  I-STAT BETA HCG BLOOD, ED ([facility], [facility], [facility] ONLY)    EKG  EKG Interpretation  Date/Time:  Thursday August 10 2017 09:29:55 EST Ventricular Rate:  73 PR Interval:    QRS Duration: 78 QT Interval:  392 QTC Calculation: 432 R Axis:   63 Text Interpretation:  Sinus rhythm Abnormal R-wave progression, early transition Baseline wander in lead(s) V5 Confirmed by Davonna Belling [phone]) on 08/10/2017 9:47:33 AM       Radiology Dg Chest 2 View  Result Date: 08/10/2017 CLINICAL DATA:  Intermittent left-sided chest discomfort radiating to the shoulder blades and into the jaw. Difficulty catching her breath. EXAM: CHEST  2 VIEW COMPARISON:  Chest x-ray of October 28, 2016 FINDINGS: The lungs are adequately inflated. There is no focal infiltrate. There is no pleural effusion. The heart and pulmonary vascularity are normal. The mediastinum is normal in width. The bony thorax exhibits no acute abnormality. IMPRESSION: There is no pneumonia nor other acute cardiopulmonary abnormality. Electronically Signed   By: David  Martinique M.D.   On: 08/10/2017 10:40    Procedures Procedures (including critical care time)  Medications Ordered in ED Medications - No data to display   Initial Impression / Assessment and Plan / ED Course  I have reviewed the triage vital signs and the nursing notes.  Pertinent labs & imaging results that were available during my care of the patient were reviewed by me and considered in my medical decision making (see chart for details).    Patient with chest pain and jaw pain.  EKG and workup reassuring.  Doubt cardiac cause.  Doubt pulmonary embolism.  Will discharge home. Final Clinical Impressions(s) / ED Diagnoses   Final diagnoses:  Nonspecific chest pain    ED Discharge Orders    None       Davonna Belling,  MD 08/10/17 1555

## 2017-08-10 NOTE — ED Triage Notes (Signed)
Patient c/o intermittent jaw pain for 2 weeks as well as intermittent left sided chest pains that will radiate to shoulder blades at times. And at times has to take big deep breath to catch her breath.

## 2017-08-13 MED FILL — PANTOPRAZOLE SOD DR 40 MG T: 40 | 90 days supply | Qty: 90 | Fill #2

## 2017-09-29 DIAGNOSIS — Z01419 Encounter for gynecological examination (general) (routine) without abnormal findings: Secondary | ICD-10-CM | POA: Diagnosis not present

## 2017-09-29 DIAGNOSIS — Z124 Encounter for screening for malignant neoplasm of cervix: Secondary | ICD-10-CM | POA: Diagnosis not present

## 2017-09-29 DIAGNOSIS — Z1231 Encounter for screening mammogram for malignant neoplasm of breast: Secondary | ICD-10-CM | POA: Diagnosis not present

## 2017-10-02 ENCOUNTER — Other Ambulatory Visit: Payer: Self-pay | Admitting: Obstetrics and Gynecology

## 2017-10-02 DIAGNOSIS — R928 Other abnormal and inconclusive findings on diagnostic imaging of breast: Secondary | ICD-10-CM

## 2017-10-04 ENCOUNTER — Ambulatory Visit
Admission: RE | Admit: 2017-10-04 | Discharge: 2017-10-04 | Disposition: A | Discharge status: Home / Self Care | Payer: 59 | Source: Ambulatory Visit | Attending: Obstetrics and Gynecology | Admitting: Obstetrics and Gynecology

## 2017-10-04 ENCOUNTER — Other Ambulatory Visit: Payer: Self-pay | Admitting: Obstetrics and Gynecology

## 2017-10-04 DIAGNOSIS — N6322 Unspecified lump in the left breast, upper inner quadrant: Secondary | ICD-10-CM | POA: Diagnosis not present

## 2017-10-04 DIAGNOSIS — N6321 Unspecified lump in the left breast, upper outer quadrant: Secondary | ICD-10-CM | POA: Diagnosis not present

## 2017-10-04 DIAGNOSIS — N6320 Unspecified lump in the left breast, unspecified quadrant: Secondary | ICD-10-CM

## 2017-10-04 DIAGNOSIS — R928 Other abnormal and inconclusive findings on diagnostic imaging of breast: Secondary | ICD-10-CM

## 2017-10-04 DIAGNOSIS — R922 Inconclusive mammogram: Secondary | ICD-10-CM | POA: Diagnosis not present

## 2017-12-04 ENCOUNTER — Other Ambulatory Visit: Payer: Self-pay | Admitting: Internal Medicine

## 2017-12-04 MED FILL — PANTOPRAZOLE SOD DR 40 MG T: 40 | 90 days supply | Qty: 90 | Fill #0

## 2018-03-20 MED FILL — PANTOPRAZOLE SOD DR 40 MG T: 40 | 90 days supply | Qty: 90 | Fill #1

## 2018-04-05 ENCOUNTER — Other Ambulatory Visit: Payer: 59

## 2018-04-13 ENCOUNTER — Other Ambulatory Visit: Payer: Self-pay | Admitting: Obstetrics and Gynecology

## 2018-04-13 ENCOUNTER — Ambulatory Visit
Admission: RE | Admit: 2018-04-13 | Discharge: 2018-04-13 | Disposition: A | Discharge status: Home / Self Care | Payer: 59 | Source: Ambulatory Visit | Attending: Obstetrics and Gynecology | Admitting: Obstetrics and Gynecology

## 2018-04-13 DIAGNOSIS — N6320 Unspecified lump in the left breast, unspecified quadrant: Secondary | ICD-10-CM

## 2018-04-16 ENCOUNTER — Other Ambulatory Visit: Payer: Self-pay | Admitting: Obstetrics and Gynecology

## 2018-04-16 DIAGNOSIS — N6320 Unspecified lump in the left breast, unspecified quadrant: Secondary | ICD-10-CM

## 2018-07-01 MED FILL — PANTOPRAZOLE SOD DR 40 MG T: 40 | 90 days supply | Qty: 90 | Fill #2

## 2018-10-15 ENCOUNTER — Other Ambulatory Visit: Payer: 59

## 2018-10-19 MED FILL — PANTOPRAZOLE SOD DR 40 MG T: 40 | 90 days supply | Qty: 90 | Fill #3

## 2018-10-22 ENCOUNTER — Other Ambulatory Visit: Payer: Self-pay

## 2018-10-22 ENCOUNTER — Ambulatory Visit
Admission: RE | Admit: 2018-10-22 | Discharge: 2018-10-22 | Disposition: A | Discharge status: Home / Self Care | Payer: No Typology Code available for payment source | Source: Ambulatory Visit | Attending: Obstetrics and Gynecology | Admitting: Obstetrics and Gynecology

## 2018-10-22 DIAGNOSIS — N6320 Unspecified lump in the left breast, unspecified quadrant: Secondary | ICD-10-CM

## 2019-01-08 ENCOUNTER — Other Ambulatory Visit: Payer: Self-pay | Admitting: Internal Medicine

## 2019-01-23 ENCOUNTER — Other Ambulatory Visit: Payer: Self-pay | Admitting: Internal Medicine

## 2019-01-29 ENCOUNTER — Other Ambulatory Visit: Payer: Self-pay | Admitting: Internal Medicine

## 2019-01-29 ENCOUNTER — Telehealth: Payer: Self-pay | Admitting: Internal Medicine

## 2019-01-29 DIAGNOSIS — K2100 Gastro-esophageal reflux disease with esophagitis, without bleeding: Secondary | ICD-10-CM

## 2019-01-29 MED ORDER — PANTOPRAZOLE SODIUM 40 MG PO TBEC: 40.0000 mg | DELAYED_RELEASE_TABLET | Freq: Every day | ORAL | 0 refills | Status: DC

## 2019-01-29 MED FILL — PANTOPRAZOLE SOD DR 40 MG T: 40 | 90 days supply | Qty: 90 | Fill #0

## 2019-01-29 NOTE — Telephone Encounter (Signed)
Medication: pantoprazole (PROTONIX) 40 MG tablet [[id number]] - pt is out of medication, scheduled next Wednesday, states that she cannot eat without medication and is in extreme discomfort without it.   Has the patient contacted their pharmacy? Yes.   (Agent: If no, request that the patient contact the pharmacy for the refill.) (Agent: If yes, when and what did the pharmacy advise?)  Preferred Pharmacy (with phone number or street name): [facility], [place] - [address] [place] [address] Phone: [phone] Fax: [phone]    Agent: Please be advised that RX refills may take up to 3 business days. We ask that you follow-up with your pharmacy.

## 2019-01-29 NOTE — Telephone Encounter (Signed)
RX sent to [facility]

## 2019-02-05 LAB — HM PAP SMEAR

## 2019-02-06 ENCOUNTER — Encounter: Payer: Self-pay | Admitting: Internal Medicine

## 2019-02-06 ENCOUNTER — Other Ambulatory Visit (INDEPENDENT_AMBULATORY_CARE_PROVIDER_SITE_OTHER): Payer: No Typology Code available for payment source

## 2019-02-06 ENCOUNTER — Ambulatory Visit (INDEPENDENT_AMBULATORY_CARE_PROVIDER_SITE_OTHER): Payer: No Typology Code available for payment source | Admitting: Internal Medicine

## 2019-02-06 ENCOUNTER — Other Ambulatory Visit: Payer: Self-pay

## 2019-02-06 VITALS — BP 124/82 | HR 86 | Temp 98.4°F | Ht 63.0 in | Wt 163.8 lb

## 2019-02-06 DIAGNOSIS — K2100 Gastro-esophageal reflux disease with esophagitis, without bleeding: Secondary | ICD-10-CM

## 2019-02-06 DIAGNOSIS — Z0000 Encounter for general adult medical examination without abnormal findings: Secondary | ICD-10-CM

## 2019-02-06 DIAGNOSIS — J301 Allergic rhinitis due to pollen: Secondary | ICD-10-CM

## 2019-02-06 LAB — LIPID PANEL
Cholesterol: 211 mg/dL — ABNORMAL HIGH (ref 0–200)
HDL: 69.4 mg/dL (ref >39.00)
LDL Cholesterol: 129 mg/dL — ABNORMAL HIGH (ref 0–99)
NonHDL: 141.68
Total CHOL/HDL Ratio: 3
Triglycerides: 63 mg/dL (ref 0.0–149.0)
VLDL: 12.6 mg/dL (ref 0.0–40.0)

## 2019-02-06 LAB — CBC WITH DIFFERENTIAL/PLATELET
Basophils Absolute: 0 10*3/uL (ref 0.0–0.1)
Basophils Relative: 0.3 % (ref 0.0–3.0)
Eosinophils Absolute: 0.1 10*3/uL (ref 0.0–0.7)
Eosinophils Relative: 1.8 % (ref 0.0–5.0)
HCT: 39.9 % (ref 36.0–46.0)
Hemoglobin: 13.1 g/dL (ref 12.0–15.0)
Lymphocytes Relative: 35.2 % (ref 12.0–46.0)
Lymphs Abs: 2 10*3/uL (ref 0.7–4.0)
MCHC: 32.9 g/dL (ref 30.0–36.0)
MCV: 88.7 fl (ref 78.0–100.0)
Monocytes Absolute: 0.4 10*3/uL (ref 0.1–1.0)
Monocytes Relative: 7.7 % (ref 3.0–12.0)
Neutro Abs: 3.2 10*3/uL (ref 1.4–7.7)
Neutrophils Relative %: 55 % (ref 43.0–77.0)
Platelets: 292 10*3/uL (ref 150.0–400.0)
RBC: 4.5 Mil/uL (ref 3.87–5.11)
RDW: 15.6 % — ABNORMAL HIGH (ref 11.5–15.5)
WBC: 5.7 10*3/uL (ref 4.0–10.5)

## 2019-02-06 MED ORDER — MONTELUKAST SODIUM 10 MG PO TABS: 10.0000 mg | ORAL_TABLET | Freq: Every day | ORAL | 1 refills | Status: DC

## 2019-02-06 MED ORDER — LEVOCETIRIZINE DIHYDROCHLORIDE 5 MG PO TABS: 5.0000 mg | ORAL_TABLET | Freq: Every evening | ORAL | 1 refills | Status: DC

## 2019-02-06 MED FILL — LEVOCETIRIZINE 5 MG TABLET: 5 | 90 days supply | Qty: 90 | Fill #0

## 2019-02-06 MED FILL — MONTELUKAST SOD 10 MG TAB: 10 | 90 days supply | Qty: 90 | Fill #0

## 2019-02-06 NOTE — Patient Instructions (Signed)
Health Maintenance, Female Adopting a healthy lifestyle and getting preventive care are important in promoting health and wellness. Ask your health care provider about:  The right schedule for you to have regular tests and exams.  Things you can do on your own to prevent diseases and keep yourself healthy. What should I know about diet, weight, and exercise? Eat a healthy diet   Eat a diet that includes plenty of vegetables, fruits, low-fat dairy products, and lean protein.  Do not eat a lot of foods that are high in solid fats, added sugars, or sodium. Maintain a healthy weight Body mass index (BMI) is used to identify weight problems. It estimates body fat based on height and weight. Your health care provider can help determine your BMI and help you achieve or maintain a healthy weight. Get regular exercise Get regular exercise. This is one of the most important things you can do for your health. Most adults should:  Exercise for at least 150 minutes each week. The exercise should increase your heart rate and make you sweat (moderate-intensity exercise).  Do strengthening exercises at least twice a week. This is in addition to the moderate-intensity exercise.  Spend less time sitting. Even light physical activity can be beneficial. Watch cholesterol and blood lipids Have your blood tested for lipids and cholesterol at 41 years of age, then have this test every 5 years. Have your cholesterol levels checked more often if:  Your lipid or cholesterol levels are high.  You are older than 40 years of age.  You are at high risk for heart disease. What should I know about cancer screening? Depending on your health history and family history, you may need to have cancer screening at various ages. This may include screening for:  Breast cancer.  Cervical cancer.  Colorectal cancer.  Skin cancer.  Lung cancer. What should I know about heart disease, diabetes, and high blood  pressure? Blood pressure and heart disease  High blood pressure causes heart disease and increases the risk of stroke. This is more likely to develop in people who have high blood pressure readings, are of African descent, or are overweight.  Have your blood pressure checked: ? Every 3-5 years if you are 18-39 years of age. ? Every year if you are 40 years old or older. Diabetes Have regular diabetes screenings. This checks your fasting blood sugar level. Have the screening done:  Once every three years after age 40 if you are at a normal weight and have a low risk for diabetes.  More often and at a younger age if you are overweight or have a high risk for diabetes. What should I know about preventing infection? Hepatitis B If you have a higher risk for hepatitis B, you should be screened for this virus. Talk with your health care provider to find out if you are at risk for hepatitis B infection. Hepatitis C Testing is recommended for:  Everyone born from 1945 through 1965.  Anyone with known risk factors for hepatitis C. Sexually transmitted infections (STIs)  Get screened for STIs, including gonorrhea and chlamydia, if: ? You are sexually active and are younger than 41 years of age. ? You are older than 41 years of age and your health care provider tells you that you are at risk for this type of infection. ? Your sexual activity has changed since you were last screened, and you are at increased risk for chlamydia or gonorrhea. Ask your health care provider if   you are at risk.  Ask your health care provider about whether you are at high risk for HIV. Your health care provider may recommend a prescription medicine to help prevent HIV infection. If you choose to take medicine to prevent HIV, you should first get tested for HIV. You should then be tested every 3 months for as long as you are taking the medicine. Pregnancy  If you are about to stop having your period (premenopausal) and  you may become pregnant, seek counseling before you get pregnant.  Take 400 to 800 micrograms (mcg) of folic acid every day if you become pregnant.  Ask for birth control (contraception) if you want to prevent pregnancy. Osteoporosis and menopause Osteoporosis is a disease in which the bones lose minerals and strength with aging. This can result in bone fractures. If you are 65 years old or older, or if you are at risk for osteoporosis and fractures, ask your health care provider if you should:  Be screened for bone loss.  Take a calcium or vitamin D supplement to lower your risk of fractures.  Be given hormone replacement therapy (HRT) to treat symptoms of menopause. Follow these instructions at home: Lifestyle  Do not use any products that contain nicotine or tobacco, such as cigarettes, e-cigarettes, and chewing tobacco. If you need help quitting, ask your health care provider.  Do not use street drugs.  Do not share needles.  Ask your health care provider for help if you need support or information about quitting drugs. Alcohol use  Do not drink alcohol if: ? Your health care provider tells you not to drink. ? You are pregnant, may be pregnant, or are planning to become pregnant.  If you drink alcohol: ? Limit how much you use to 0-1 drink a day. ? Limit intake if you are breastfeeding.  Be aware of how much alcohol is in your drink. In the [place] one drink equals one 12 oz bottle of beer (355 mL), one 5 oz glass of wine (148 mL), or one 1 oz glass of hard liquor (44 mL). General instructions  Schedule regular health, dental, and eye exams.  Stay current with your vaccines.  Tell your health care provider if: ? You often feel depressed. ? You have ever been abused or do not feel safe at home. Summary  Adopting a healthy lifestyle and getting preventive care are important in promoting health and wellness.  Follow your health care provider's instructions about healthy  diet, exercising, and getting tested or screened for diseases.  Follow your health care provider's instructions on monitoring your cholesterol and blood pressure. This information is not intended to replace advice given to you by your health care provider. Make sure you discuss any questions you have with your health care provider. Document Released: 02/07/2011 Document Revised: 07/18/2018 Document Reviewed: 07/18/2018 Elsevier Patient Education  2020 [employer]  

## 2019-02-06 NOTE — Progress Notes (Signed)
Subjective:  Patient ID: Felicia Johnson, female    DOB: Dec 01, 1975  Age: 43 y.o. MRN: [id number]  CC: Allergic Rhinitis , Gastroesophageal Reflux, and Annual Exam   HPI Felicia Johnson presents for a CPX.  She complains of nasal congestion, runny nose, and postnasal drip.  She is not getting much symptom relief with Zyrtec.  She is not willing to do a steroid nasal spray.  She tells me her GERD symptoms have been well controlled.  She denies odynophagia or dysphagia.  She wants to continue taking the PPI.   Outpatient Medications Prior to Visit  Medication Sig Dispense Refill   pantoprazole (PROTONIX) 40 MG tablet Take 1 tablet (40 mg total) by mouth daily. 90 tablet 0   cetirizine (ZYRTEC) 10 MG tablet Take 1 tablet (10 mg total) by mouth daily. Must see provider for future refills 90 tablet 3   No facility-administered medications prior to visit.     ROS Review of Systems  Constitutional: Positive for unexpected weight change (wt gain). Negative for chills, diaphoresis and fatigue.  HENT: Positive for congestion, postnasal drip, rhinorrhea and sinus pressure. Negative for facial swelling, sinus pain, sore throat and trouble swallowing.   Eyes: Negative.   Respiratory: Negative for cough, chest tightness, shortness of breath and wheezing.   Cardiovascular: Negative for chest pain, palpitations and leg swelling.  Gastrointestinal: Negative for abdominal pain, constipation, diarrhea, nausea and vomiting.  Genitourinary: Negative.  Negative for difficulty urinating and dysuria.  Musculoskeletal: Negative.   Skin: Negative.  Negative for color change and pallor.  Neurological: Negative.  Negative for dizziness, weakness and light-headedness.  Hematological: Negative for adenopathy. Does not bruise/bleed easily.  Psychiatric/Behavioral: Negative.     Objective:  BP 124/82 (BP Location: Left Arm, Patient Position: Sitting, Cuff Size: Normal)    Pulse 86    Temp 98.4 F (36.9 C)  (Oral)    Ht 5\' 3"  (1.6 m)    Wt 163 lb 12 oz (74.3 kg)    LMP 01/07/2019 (Exact Date)    SpO2 99%    BMI 29.01 kg/m   BP Readings from Last 3 Encounters:  02/06/19 124/82  08/10/17 115/68  04/03/17 118/80    Wt Readings from Last 3 Encounters:  02/06/19 163 lb 12 oz (74.3 kg)  08/10/17 160 lb (72.6 kg)  04/03/17 162 lb (73.5 kg)    Physical Exam Vitals signs reviewed.  Constitutional:      Appearance: She is not ill-appearing or diaphoretic.  HENT:     Nose: Mucosal edema and congestion present. No rhinorrhea.     Right Nostril: No foreign body or epistaxis.     Left Nostril: No foreign body or epistaxis.     Right Turbinates: Not swollen.     Left Turbinates: Not swollen.     Right Sinus: No maxillary sinus tenderness or frontal sinus tenderness.     Left Sinus: No maxillary sinus tenderness or frontal sinus tenderness.     Mouth/Throat:     Mouth: Mucous membranes are moist.     Pharynx: Oropharynx is clear.  Eyes:     General: No scleral icterus.    Conjunctiva/sclera: Conjunctivae normal.  Neck:     Musculoskeletal: Normal range of motion. No neck rigidity or muscular tenderness.  Cardiovascular:     Rate and Rhythm: Normal rate and regular rhythm.     Heart sounds: No murmur.  Pulmonary:     Effort: Pulmonary effort is normal.  Breath sounds: No stridor. No wheezing, rhonchi or rales.  Abdominal:     General: Abdomen is protuberant. Bowel sounds are normal. There is no distension.     Palpations: There is no hepatomegaly or splenomegaly.     Tenderness: There is no abdominal tenderness.  Musculoskeletal: Normal range of motion.     Right lower leg: No edema.     Left lower leg: No edema.  Lymphadenopathy:     Cervical: No cervical adenopathy.  Skin:    General: Skin is warm and dry.     Coloration: Skin is not pale.  Neurological:     General: No focal deficit present.     Mental Status: She is alert and oriented to person, place, and time. Mental  status is at baseline.  Psychiatric:        Mood and Affect: Mood normal.        Behavior: Behavior normal.     Lab Results  Component Value Date   WBC 5.7 02/06/2019   HGB 13.1 02/06/2019   HCT 39.9 02/06/2019   PLT 292.0 02/06/2019   GLUCOSE 98 08/10/2017   CHOL 211 (H) 02/06/2019   TRIG 63.0 02/06/2019   HDL 69.40 02/06/2019   LDLDIRECT 134.7 05/10/2013   LDLCALC 129 (H) 02/06/2019   ALT 10 04/03/2017   AST 15 04/03/2017   NA 137 08/10/2017   K 3.7 08/10/2017   CL 108 08/10/2017   CREATININE 0.81 08/10/2017   BUN 11 08/10/2017   CO2 23 08/10/2017   TSH 1.83 04/03/2017    US Breast Ltd Uni Left Inc Axilla  Result Date: 10/22/2018 CLINICAL DATA:  43 year old female for 1 year follow-up of LEFT breast mass and for annual bilateral mammogram. EXAM: DIGITAL DIAGNOSTIC BILATERAL MAMMOGRAM WITH CAD AND TOMO ULTRASOUND LEFT BREAST COMPARISON:  Previous exam(s). ACR Breast Density Category c: The breast tissue is heterogeneously dense, which may obscure small masses. FINDINGS: 2D/3D full field views of both breast demonstrate a stable circumscribed oval mass within the UPPER LEFT breast. No new mass, distortion or worrisome calcifications are noted within either breast. Mammographic images were processed with CAD. Targeted ultrasound is performed, showing a stable 0.9 x 0.4 x 0.9 cm circumscribed oval hypoechoic mass at 12 o'clock position of the LEFT breast 1 cm nipple, likely a fibroadenoma. IMPRESSION: 1. Stable likely benign UPPER LEFT breast mass, likely a fibroadenoma. One additional follow-up in 1 year is recommended to ensure 2 year stability. 2. No new or suspicious findings within either breast otherwise. RECOMMENDATION: Bilateral diagnostic mammogram with LEFT breast ultrasound in 1 year. I have discussed the findings and recommendations with the patient. Results were also provided in writing at the conclusion of the visit. If applicable, a reminder letter will be sent to the  patient regarding the next appointment. BI-RADS CATEGORY  3: Probably benign. Electronically Signed   By: Margarette Canada M.D.   On: 10/22/2018 11:03   Mm Diag Breast Tomo Bilateral  Result Date: 10/22/2018 CLINICAL DATA:  43 year old female for 1 year follow-up of LEFT breast mass and for annual bilateral mammogram. EXAM: DIGITAL DIAGNOSTIC BILATERAL MAMMOGRAM WITH CAD AND TOMO ULTRASOUND LEFT BREAST COMPARISON:  Previous exam(s). ACR Breast Density Category c: The breast tissue is heterogeneously dense, which may obscure small masses. FINDINGS: 2D/3D full field views of both breast demonstrate a stable circumscribed oval mass within the UPPER LEFT breast. No new mass, distortion or worrisome calcifications are noted within either breast. Mammographic images were processed with CAD.  Targeted ultrasound is performed, showing a stable 0.9 x 0.4 x 0.9 cm circumscribed oval hypoechoic mass at 12 o'clock position of the LEFT breast 1 cm nipple, likely a fibroadenoma. IMPRESSION: 1. Stable likely benign UPPER LEFT breast mass, likely a fibroadenoma. One additional follow-up in 1 year is recommended to ensure 2 year stability. 2. No new or suspicious findings within either breast otherwise. RECOMMENDATION: Bilateral diagnostic mammogram with LEFT breast ultrasound in 1 year. I have discussed the findings and recommendations with the patient. Results were also provided in writing at the conclusion of the visit. If applicable, a reminder letter will be sent to the patient regarding the next appointment. BI-RADS CATEGORY  3: Probably benign. Electronically Signed   By: Margarette Canada M.D.   On: 10/22/2018 11:03    Assessment & Plan:   Felicia Johnson was seen today for allergic rhinitis , gastroesophageal reflux and annual exam.  Diagnoses and all orders for this visit:  Gastroesophageal reflux disease with esophagitis- Her symptoms are well controlled.  There are no alarming signs or symptoms.  Will continue the PPI at the  current dose. -     CBC with Differential/Platelet; Future  Routine general medical examination at a health care facility- Exam completed, labs reviewed, vaccines reviewed, her Pap and mammogram are up-to-date, patient education material was given. -     Lipid panel; Future -     HIV Antibody (routine testing w rflx); Future  Allergic rhinitis due to pollen, unspecified seasonality -     levocetirizine (XYZAL) 5 MG tablet; Take 1 tablet (5 mg total) by mouth every evening. -     montelukast (SINGULAIR) 10 MG tablet; Take 1 tablet (10 mg total) by mouth at bedtime.   I have discontinued Felicia Johnson's cetirizine. I am also having her start on levocetirizine and montelukast. Additionally, I am having her maintain her pantoprazole.  Meds ordered this encounter  Medications   levocetirizine (XYZAL) 5 MG tablet    Sig: Take 1 tablet (5 mg total) by mouth every evening.    Dispense:  90 tablet    Refill:  1   montelukast (SINGULAIR) 10 MG tablet    Sig: Take 1 tablet (10 mg total) by mouth at bedtime.    Dispense:  90 tablet    Refill:  1     Follow-up: Return in about 1 year (around 02/06/2020).  Scarlette Calico, MD

## 2019-02-07 ENCOUNTER — Encounter: Payer: Self-pay | Admitting: Internal Medicine

## 2019-02-07 LAB — HIV ANTIBODY (ROUTINE TESTING W REFLEX): HIV 1&2 Ab, 4th Generation: NONREACTIVE

## 2019-04-26 ENCOUNTER — Other Ambulatory Visit: Payer: Self-pay | Admitting: Internal Medicine

## 2019-04-26 DIAGNOSIS — K2100 Gastro-esophageal reflux disease with esophagitis, without bleeding: Secondary | ICD-10-CM

## 2019-05-10 MED FILL — LEVOCETIRIZINE 5 MG TABLET: 5 | 90 days supply | Qty: 90 | Fill #1

## 2019-05-10 MED FILL — MONTELUKAST SOD 10 MG TAB: 10 | 90 days supply | Qty: 90 | Fill #1

## 2019-05-11 MED FILL — PANTOPRAZOLE SOD DR 40 MG T: 40 | 90 days supply | Qty: 90 | Fill #0

## 2019-07-22 ENCOUNTER — Encounter: Payer: Self-pay | Admitting: Family

## 2019-07-22 ENCOUNTER — Ambulatory Visit (INDEPENDENT_AMBULATORY_CARE_PROVIDER_SITE_OTHER): Payer: No Typology Code available for payment source | Admitting: Family

## 2019-07-22 DIAGNOSIS — J301 Allergic rhinitis due to pollen: Secondary | ICD-10-CM

## 2019-07-22 MED ORDER — FLUTICASONE PROPIONATE 50 MCG/ACT NA SUSP: 2.0000 | Freq: Every day | NASAL | 6 refills | Status: DC

## 2019-07-22 MED FILL — FLUTICASONE PROP 50 MCG SPR: 50 | 30 days supply | Qty: 16 | Fill #0

## 2019-07-22 NOTE — Progress Notes (Signed)
  Felicia Johnson is a 43 y.o. female with the following history as recorded in Epic[employer]:  Patient Active Problem List   Diagnosis Date Noted  . GERD (gastroesophageal reflux disease) 10/28/2016  . Allergic rhinitis 05/10/2013  . Routine general medical examination at a health care facility 10/31/2011    Current Outpatient Medications  Medication Sig Dispense Refill  . fluticasone (FLONASE) 50 MCG/ACT nasal spray Place 2 sprays into both nostrils daily. 16 g 6  . levocetirizine (XYZAL) 5 MG tablet Take 1 tablet (5 mg total) by mouth every evening. 90 tablet 1  . montelukast (SINGULAIR) 10 MG tablet Take 1 tablet (10 mg total) by mouth at bedtime. 90 tablet 1  . pantoprazole (PROTONIX) 40 MG tablet TAKE 1 TABLET (40 MG TOTAL) BY MOUTH DAILY. 90 tablet 1   No current facility-administered medications for this visit.    Allergies: Patient has no known allergies.  Past Medical History:  Diagnosis Date  . Acid reflux   . Hyperlipidemia     Past Surgical History:  Procedure Laterality Date  . TUBAL LIGATION      Family History  Problem Relation Age of Onset  . Hypertension Mother   . Stroke Other   . Cancer Neg Hx   . Heart disease Neg Hx   . Hyperlipidemia Neg Hx   . Diabetes Neg Hx   . Drug abuse Neg Hx   . Alcohol abuse Neg Hx   . Kidney disease Neg Hx     Social History   Tobacco Use  . Smoking status: Never Smoker  . Smokeless tobacco: Never Used  Substance Use Topics  . Alcohol use: No    Subjective:    I connected with Felicia Johnson on 07/22/19 at 10:20 AM EST by a video enabled telemedicine application and verified that I am speaking with the correct person using two identifiers.   I discussed the limitations of evaluation and management by telemedicine and the availability of in person appointments. The patient expressed understanding and agreed to proceed. Provider in office/ patient is at home; provider and patient are only 2 people on video call.   Woke up  this morning with "ringing in her ear" around 2 am or 3 am; notes that she is actually feeling better today as the day has progressed and has actually gotten some relief since her sinuses have started draining this morning; no fever, no sinus pain or pressure;    LMP- now;     Objective:  There were no vitals filed for this visit.  General: Well developed, well nourished, in no acute distress  Lungs: Respirations unlabored;  Neurologic: Alert and oriented; speech intact; face symmetrical;   Assessment:  1. Allergic rhinitis due to pollen, unspecified seasonality     Plan:  Try adding Flonase to current regimen; if symptoms persist, patient will need to be seen for in office visit later this week to physically look at the ear;   No follow-ups on file.  No orders of the defined types were placed in this encounter.   Requested Prescriptions   Signed Prescriptions Disp Refills  . fluticasone (FLONASE) 50 MCG/ACT nasal spray 16 g 6    Sig: Place 2 sprays into both nostrils daily.

## 2019-07-27 ENCOUNTER — Other Ambulatory Visit: Payer: Self-pay | Admitting: Internal Medicine

## 2019-07-27 DIAGNOSIS — J301 Allergic rhinitis due to pollen: Secondary | ICD-10-CM

## 2019-07-31 MED FILL — MONTELUKAST SOD 10 MG TAB: 10 | 90 days supply | Qty: 90 | Fill #0

## 2019-07-31 MED FILL — LEVOCETIRIZINE 5 MG TABLET: 5 | 90 days supply | Qty: 90 | Fill #0

## 2019-08-15 MED FILL — PANTOPRAZOLE SOD DR 40 MG T: 40 | 90 days supply | Qty: 90 | Fill #1

## 2019-08-25 MED FILL — FLUTICASONE PROP 50 MCG SPR: 50 | 30 days supply | Qty: 16 | Fill #1

## 2019-08-28 ENCOUNTER — Ambulatory Visit (INDEPENDENT_AMBULATORY_CARE_PROVIDER_SITE_OTHER): Payer: No Typology Code available for payment source | Admitting: Medical

## 2019-08-28 ENCOUNTER — Encounter: Payer: Self-pay | Admitting: Medical

## 2019-08-28 VITALS — BP 118/80 | HR 94 | Temp 98.6°F | Wt 160.6 lb

## 2019-08-28 DIAGNOSIS — H68013 Acute Eustachian salpingitis, bilateral: Secondary | ICD-10-CM | POA: Insufficient documentation

## 2019-08-28 DIAGNOSIS — J301 Allergic rhinitis due to pollen: Secondary | ICD-10-CM

## 2019-08-28 DIAGNOSIS — H9203 Otalgia, bilateral: Secondary | ICD-10-CM | POA: Insufficient documentation

## 2019-08-28 MED ORDER — PSEUDOEPHEDRINE HCL ER 120 MG PO TB12: 120.0000 mg | ORAL_TABLET | Freq: Two times a day (BID) | ORAL | 0 refills | Status: DC

## 2019-08-28 NOTE — Patient Instructions (Signed)
Continue Flonase nasal Hydrate well with water Begin Sudafed decongestant for up to a week  If not much improved by the end of a week, call your primary doctor and consider ENT referral since you have been have ear discomfort for weeks to months.  If you have new symptoms such as fever, cough, chills, body aches, shortness of breath, then get a covid test.      Barotitis Media Barotitis media is soreness (inflammation) of the area behind the eardrum (middle ear). This occurs when the auditory tube (Eustachian tube) leading from the back of the throat to the eardrum is blocked. When it is blocked air cannot move in and out of the middle ear to equalize pressure changes. These pressure changes come from changes in altitude when:  Flying.   Driving in the mountains.   Diving.  Problems are more likely to occur with pressure changes during times when you are congested as from:  Hay fever.   Upper respiratory infection.   A cold.  Damage or hearing loss (barotrauma) caused by this may be permanent. HOME CARE INSTRUCTIONS   Use medicines as recommended by your caregiver. Over the counter medicines will help unblock the canal and can help during times of air travel.   Do not put anything into your ears to clean or unplug them. Eardrops will not be helpful.   Do not swim, dive, or fly until your caregiver says it is all right to do so. If these activities are necessary, chewing gum with frequent swallowing may help. It is also helpful to hold your nose and gently blow to pop your ears for equalizing pressure changes. This forces air into the Eustachian tube.   For little ones with problems, give your baby a bottle of water or juice during periods when pressure changes would be anticipated such as during take offs and landings associated with air travel.   Only take over-the-counter or prescription medicines for pain, discomfort, or fever as directed by your caregiver.   A decongestant  may be helpful in de-congesting the middle ear and make pressure equalization easier. This can be even more effective if the drops (spray) are delivered with the head lying over the edge of a bed with the head tilted toward the ear on the affected side.   If your caregiver has given you a follow-up appointment, it is very important to keep that appointment. Not keeping the appointment could result in a chronic or permanent injury, pain, hearing loss and disability. If there is any problem keeping the appointment, you must call back to this facility for assistance.  SEEK IMMEDIATE MEDICAL CARE IF:   You develop a severe headache, dizziness, severe ear pain, or bloody or pus-like drainage from your ears.   An oral temperature above 102 F (38.9 C) develops.   Your problems do not improve or become worse.  MAKE SURE YOU:   Understand these instructions.   Will watch your condition.   Will get help right away if you are not doing well or get worse.  Document Released: 07/22/2000 Document Revised: 04/06/2011 Document Reviewed: 02/28/2008 [facility] Patient Information 2012 [place].

## 2019-08-28 NOTE — Progress Notes (Signed)
[facility]   Subjective:  Felicia Johnson is a 44 y.o. female who presents for respiratory illness.    PCP:  Janith Lima, MD  Visit today for concerns of ear pain.  She reports intermittent ear discomfort for weeks to months.  She will get symptoms of ear discomfort that will last for weeks and will resolve then will come back.  This is been on and off.  Over the past 2 to 3 weeks she has had ongoing ear pressure/discomfort .  She did a virtual consult a few weeks ago and was prescribed Flonase.  This did help some but the symptoms are still persisting.  She does not feel sick.  She has not had any other significant symptoms.  She had a little drainage irritating her throat once.  Denies hearing loss, denies dizziness, denies sinus pain.  She denies fever, cough, chills, body aches, nausea, vomiting, diarrhea, shortness of breath.  She had her first Covid vaccine on January 2.  She had a few days of body aches after that.  She gets her second Covid shot tomorrow.  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  She denies any household sick contacts.  No other aggravating or relieving factors.  No other c/o.  Past Medical History:  Diagnosis Date  . Acid reflux   . Hyperlipidemia    Current Outpatient Medications on File Prior to Visit  Medication Sig Dispense Refill  . fluticasone (FLONASE) 50 MCG/ACT nasal spray Place 2 sprays into both nostrils daily. 16 g 6  . levocetirizine (XYZAL) 5 MG tablet TAKE 1 TABLET BY MOUTH EVERY EVENING 90 tablet 1  . montelukast (SINGULAIR) 10 MG tablet TAKE 1 TABLET BY MOUTH AT BEDTIME 90 tablet 1  . pantoprazole (PROTONIX) 40 MG tablet TAKE 1 TABLET (40 MG TOTAL) BY MOUTH DAILY. 90 tablet 1   No current facility-administered medications on file prior to visit.    ROS as in subjective   Objective: BP 118/80   Pulse 94   Temp 98.6 F (37 C)   Wt 160 lb 9.6 oz (72.8 kg)   SpO2 98%    BMI 28.45 kg/m   General appearance: Alert, WD/WN, no distress, not ill appearing                             Skin: warm, no rash                           Head: no sinus tenderness                            Eyes: conjunctiva normal, corneas clear, PERRLA                            Ears: flat TMs, external ear canals normal                          Nose: septum midline, turbinates not swollen, no erythema and some dry mucoid discharge             Mouth/throat: MMM, tongue normal, no pharyngeal erythema  Neck: supple, no adenopathy, no thyromegaly, non tender                          Heart: RRR, normal S1, S2, no murmurs                         Lungs: CTA bilaterally, no wheezes, rales, or rhonchi       Assessment  Encounter Diagnoses  Name Primary?  . Eustachian salpingitis, acute, bilateral Yes  . Otalgia of both ears   . Allergic rhinitis due to pollen, unspecified seasonality       Plan: We discussed her exam and concerns.  Her symptoms seem more chronic in nature related to eustachian dysfunction.  She really does not seem ill.  Her exam is reassuring today.  She did get some improvement with Flonase.  Begin Sudafed for the next 5 days, increase water intake.  Continue her other usual allergy medicines.  If not significantly improved within a week or if symptoms return or persist, then contact PCP about other recommendations or ENT consult.  We discussed possible round of antibiotic if the Sudafed does not significantly help her symptoms, but she was reluctant to do this or have this as a backup given history of yeast infections with antibiotics  We discussed that some people have had ear pain as part of their Covid symptomology, but she does not seem sick and has basically no other symptoms.  Her symptoms have been ongoing for weeks.  She declined a Covid test, and there is no real suspicion for Covid.  She has been careful with her PPE use at  work  Diagnoses and all orders for this visit:  Eustachian salpingitis, acute, bilateral  Otalgia of both ears  Allergic rhinitis due to pollen, unspecified seasonality  Other orders -     pseudoephedrine (SUDAFED 12 HOUR) 120 MG 12 hr tablet; Take 1 tablet (120 mg total) by mouth 2 (two) times daily.  f/u with PCP.

## 2019-08-29 MED FILL — PSEUDOEPHEDRINE HCL ER 120: 120 | 5 days supply | Qty: 10 | Fill #0

## 2019-09-10 ENCOUNTER — Other Ambulatory Visit: Payer: Self-pay | Admitting: Obstetrics and Gynecology

## 2019-09-10 DIAGNOSIS — N6320 Unspecified lump in the left breast, unspecified quadrant: Secondary | ICD-10-CM

## 2019-10-05 MED FILL — FLUTICASONE PROP 50 MCG SPR: 50 | 30 days supply | Qty: 16 | Fill #2

## 2019-10-28 ENCOUNTER — Ambulatory Visit
Admission: RE | Admit: 2019-10-28 | Discharge: 2019-10-28 | Disposition: A | Discharge status: Home / Self Care | Payer: No Typology Code available for payment source | Source: Ambulatory Visit | Attending: Obstetrics and Gynecology | Admitting: Obstetrics and Gynecology

## 2019-10-28 ENCOUNTER — Other Ambulatory Visit: Payer: Self-pay

## 2019-10-28 DIAGNOSIS — N6320 Unspecified lump in the left breast, unspecified quadrant: Secondary | ICD-10-CM

## 2019-11-07 ENCOUNTER — Other Ambulatory Visit: Payer: Self-pay | Admitting: Internal Medicine

## 2019-11-07 DIAGNOSIS — K2100 Gastro-esophageal reflux disease with esophagitis, without bleeding: Secondary | ICD-10-CM

## 2019-11-07 MED FILL — PANTOPRAZOLE SOD DR 40 MG T: 40 | 90 days supply | Qty: 90 | Fill #0

## 2019-11-07 MED FILL — MONTELUKAST SOD 10 MG TAB: 10 | 90 days supply | Qty: 90 | Fill #1

## 2019-11-07 MED FILL — FLUTICASONE PROP 50 MCG SPR: 50 | 30 days supply | Qty: 16 | Fill #3

## 2019-11-07 MED FILL — LEVOCETIRIZINE 5 MG TABLET: 5 | 90 days supply | Qty: 90 | Fill #1

## 2019-12-14 MED FILL — FLUTICASONE PROP 50 MCG SPR: 50 | 30 days supply | Qty: 16 | Fill #4

## 2019-12-26 ENCOUNTER — Encounter: Payer: Self-pay | Admitting: Internal Medicine

## 2019-12-26 ENCOUNTER — Ambulatory Visit (INDEPENDENT_AMBULATORY_CARE_PROVIDER_SITE_OTHER): Payer: No Typology Code available for payment source | Admitting: Internal Medicine

## 2019-12-26 ENCOUNTER — Other Ambulatory Visit: Payer: Self-pay

## 2019-12-26 VITALS — BP 140/94 | HR 83 | Temp 98.3°F | Resp 16 | Ht 63.0 in | Wt 161.0 lb

## 2019-12-26 DIAGNOSIS — E559 Vitamin D deficiency, unspecified: Secondary | ICD-10-CM | POA: Diagnosis not present

## 2019-12-26 DIAGNOSIS — R030 Elevated blood-pressure reading, without diagnosis of hypertension: Secondary | ICD-10-CM | POA: Insufficient documentation

## 2019-12-26 DIAGNOSIS — Z111 Encounter for screening for respiratory tuberculosis: Secondary | ICD-10-CM | POA: Insufficient documentation

## 2019-12-26 DIAGNOSIS — Z0184 Encounter for antibody response examination: Secondary | ICD-10-CM | POA: Insufficient documentation

## 2019-12-26 DIAGNOSIS — Z1159 Encounter for screening for other viral diseases: Secondary | ICD-10-CM | POA: Insufficient documentation

## 2019-12-26 LAB — BASIC METABOLIC PANEL
BUN: 11 mg/dL (ref 6–23)
CO2: 24 mEq/L (ref 19–32)
Calcium: 9.1 mg/dL (ref 8.4–10.5)
Chloride: 104 mEq/L (ref 96–112)
Creatinine, Ser: 0.79 mg/dL (ref 0.40–1.20)
GFR: 96.43 mL/min (ref >60.00)
Glucose, Bld: 91 mg/dL (ref 70–99)
Potassium: 3.8 mEq/L (ref 3.5–5.1)
Sodium: 135 mEq/L (ref 135–145)

## 2019-12-26 LAB — CBC WITH DIFFERENTIAL/PLATELET
Basophils Absolute: 0 10*3/uL (ref 0.0–0.1)
Basophils Relative: 0.5 % (ref 0.0–3.0)
Eosinophils Absolute: 0.1 10*3/uL (ref 0.0–0.7)
Eosinophils Relative: 2.7 % (ref 0.0–5.0)
HCT: 37.9 % (ref 36.0–46.0)
Hemoglobin: 12.5 g/dL (ref 12.0–15.0)
Lymphocytes Relative: 42.1 % (ref 12.0–46.0)
Lymphs Abs: 2.2 10*3/uL (ref 0.7–4.0)
MCHC: 33 g/dL (ref 30.0–36.0)
MCV: 87.5 fl (ref 78.0–100.0)
Monocytes Absolute: 0.4 10*3/uL (ref 0.1–1.0)
Monocytes Relative: 8 % (ref 3.0–12.0)
Neutro Abs: 2.5 10*3/uL (ref 1.4–7.7)
Neutrophils Relative %: 46.7 % (ref 43.0–77.0)
Platelets: 296 10*3/uL (ref 150.0–400.0)
RBC: 4.33 Mil/uL (ref 3.87–5.11)
RDW: 15.9 % — ABNORMAL HIGH (ref 11.5–15.5)
WBC: 5.3 10*3/uL (ref 4.0–10.5)

## 2019-12-26 LAB — URINALYSIS, ROUTINE W REFLEX MICROSCOPIC
Bilirubin Urine: NEGATIVE
Hgb urine dipstick: NEGATIVE
Ketones, ur: NEGATIVE
Leukocytes,Ua: NEGATIVE
Nitrite: NEGATIVE
RBC / HPF: NONE SEEN (ref 0–?)
Specific Gravity, Urine: 1.015 (ref 1.000–1.030)
Total Protein, Urine: NEGATIVE
Urine Glucose: NEGATIVE
Urobilinogen, UA: 0.2 (ref 0.0–1.0)
pH: 7 (ref 5.0–8.0)

## 2019-12-26 LAB — HEPATIC FUNCTION PANEL
ALT: 16 U/L (ref 0–35)
AST: 19 U/L (ref 0–37)
Albumin: 4 g/dL (ref 3.5–5.2)
Alkaline Phosphatase: 44 U/L (ref 39–117)
Bilirubin, Direct: 0.1 mg/dL (ref 0.0–0.3)
Total Bilirubin: 0.3 mg/dL (ref 0.2–1.2)
Total Protein: 7.4 g/dL (ref 6.0–8.3)

## 2019-12-26 LAB — VITAMIN D 25 HYDROXY (VIT D DEFICIENCY, FRACTURES): VITD: 24.27 ng/mL — ABNORMAL LOW (ref 30.00–100.00)

## 2019-12-26 LAB — TSH: TSH: 1.61 u[IU]/mL (ref 0.35–4.50)

## 2019-12-26 MED ORDER — CHOLECALCIFEROL 50 MCG (2000 UT) PO TABS: 1.0000 | ORAL_TABLET | Freq: Every day | ORAL | 1 refills | Status: AC

## 2019-12-26 NOTE — Patient Instructions (Signed)

## 2019-12-26 NOTE — Progress Notes (Signed)
Subjective:  Patient ID: Felicia Johnson, female    DOB: 1975-12-04  Age: 44 y.o. MRN: [id number]  CC: Hypertension  This visit occurred during the SARS-CoV-2 public health emergency.  Safety protocols were in place, including screening questions prior to the visit, additional usage of staff PPE, and extensive cleaning of exam room while observing appropriate contact time as indicated for disinfecting solutions.    HPI Felicia Johnson presents for f/up - She comes in today to be screened for tuberculosis and to have hep B and varicella titers performed to complete forms for her line of work.  She has not recently been working on her lifestyle modifications.  She is not taking any anti-inflammatories or decongestants.  She denies any recent episodes of headache, blurred vision, chest pain, shortness of breath, palpitations, edema, or fatigue.  Outpatient Medications Prior to Visit  Medication Sig Dispense Refill  . fluticasone (FLONASE) 50 MCG/ACT nasal spray Place 2 sprays into both nostrils daily. 16 g 6  . levocetirizine (XYZAL) 5 MG tablet TAKE 1 TABLET BY MOUTH EVERY EVENING 90 tablet 1  . montelukast (SINGULAIR) 10 MG tablet TAKE 1 TABLET BY MOUTH AT BEDTIME 90 tablet 1  . pantoprazole (PROTONIX) 40 MG tablet TAKE 1 TABLET BY MOUTH ONCE DAILY 90 tablet 1  . pseudoephedrine (SUDAFED 12 HOUR) 120 MG 12 hr tablet Take 1 tablet (120 mg total) by mouth 2 (two) times daily. 10 tablet 0   No facility-administered medications prior to visit.    ROS Review of Systems  Constitutional: Negative for diaphoresis, fatigue and unexpected weight change.  HENT: Negative.   Eyes: Negative for visual disturbance.  Respiratory: Negative for cough, chest tightness, shortness of breath and wheezing.   Cardiovascular: Negative for chest pain, palpitations and leg swelling.  Gastrointestinal: Negative for abdominal pain, constipation, diarrhea, nausea and vomiting.  Endocrine: Negative.   Genitourinary:  Negative.  Negative for difficulty urinating and hematuria.  Musculoskeletal: Negative for arthralgias and myalgias.  Skin: Negative.  Negative for color change and pallor.  Neurological: Negative for dizziness, weakness and light-headedness.  Hematological: Negative for adenopathy. Does not bruise/bleed easily.  Psychiatric/Behavioral: Negative.     Objective:  BP (!) 140/94 (BP Location: Left Arm, Patient Position: Sitting, Cuff Size: Large)   Pulse 83   Temp 98.3 F (36.8 C) (Oral)   Resp 16   Ht 5\' 3"  (1.6 m)   Wt 161 lb (73 kg)   LMP 11/12/2019 (Within Days) Comment: s/p BTL  SpO2 97%   BMI 28.52 kg/m   BP Readings from Last 3 Encounters:  12/26/19 (!) 140/94  08/28/19 118/80  02/06/19 124/82    Wt Readings from Last 3 Encounters:  12/26/19 161 lb (73 kg)  08/28/19 160 lb 9.6 oz (72.8 kg)  02/06/19 163 lb 12 oz (74.3 kg)    Physical Exam Vitals reviewed.  Constitutional:      Appearance: Normal appearance.  HENT:     Nose: Nose normal.     Mouth/Throat:     Mouth: Mucous membranes are moist.  Eyes:     General: No scleral icterus.    Conjunctiva/sclera: Conjunctivae normal.  Cardiovascular:     Rate and Rhythm: Normal rate and regular rhythm.     Heart sounds: No murmur.  Pulmonary:     Effort: Pulmonary effort is normal.     Breath sounds: No stridor. No wheezing, rhonchi or rales.  Abdominal:     General: Abdomen is protuberant. Bowel sounds are  normal. There is no distension.     Palpations: Abdomen is soft. There is no hepatomegaly, splenomegaly or mass.     Tenderness: There is no abdominal tenderness.  Musculoskeletal:        General: Normal range of motion.     Cervical back: Neck supple.     Right lower leg: No edema.     Left lower leg: No edema.  Lymphadenopathy:     Cervical: No cervical adenopathy.  Skin:    General: Skin is warm and dry.  Neurological:     General: No focal deficit present.     Mental Status: She is alert and oriented  to person, place, and time. Mental status is at baseline.  Psychiatric:        Mood and Affect: Mood normal.        Behavior: Behavior normal.     Lab Results  Component Value Date   WBC 5.3 12/26/2019   HGB 12.5 12/26/2019   HCT 37.9 12/26/2019   PLT 296.0 12/26/2019   GLUCOSE 91 12/26/2019   CHOL 211 (H) 02/06/2019   TRIG 63.0 02/06/2019   HDL 69.40 02/06/2019   LDLDIRECT 134.7 05/10/2013   LDLCALC 129 (H) 02/06/2019   ALT 16 12/26/2019   AST 19 12/26/2019   NA 135 12/26/2019   K 3.8 12/26/2019   CL 104 12/26/2019   CREATININE 0.79 12/26/2019   BUN 11 12/26/2019   CO2 24 12/26/2019   TSH 1.61 12/26/2019    US BREAST LTD UNI LEFT INC AXILLA  Result Date: 10/28/2019 CLINICAL DATA:  Routine annual examination of both breasts and final 2 year follow-up of a probably benign mass, likely a fibroadenoma, in the 12 o'clock position of the left breast. EXAM: DIGITAL DIAGNOSTIC BILATERAL MAMMOGRAM WITH CAD AND TOMO ULTRASOUND LEFT BREAST COMPARISON:  Previous exam(s). ACR Breast Density Category c: The breast tissue is heterogeneously dense, which may obscure small masses. FINDINGS: No mass, architectural distortion, or suspicious microcalcification is identified to suggest malignancy in either breast. Mammographic images were processed with CAD. Targeted ultrasound is performed, showing an oval parallel circumscribed gently lobulated oval mass at 12 o'clock position 1 cm from the nipple in the left breast. Mass measures 10 x 4 x 9 mm, stable and has appearances consistent with a fibroadenoma. The mass measured 10 x 4 x 9 mm in February of 2019. IMPRESSION: Two year stability of a left breast mass at 12 o'clock position. This mass can be considered benign and has an appearance suggestive of a fibroadenoma. No evidence of malignancy in either breast. RECOMMENDATION: Screening mammogram in one year.(Code:SM-B-01Y) I have discussed the findings and recommendations with the patient. If  applicable, a reminder letter will be sent to the patient regarding the next appointment. BI-RADS CATEGORY  2: Benign. Electronically Signed   By: Curlene Dolphin M.D.   On: 10/28/2019 15:16   MM DIAG BREAST TOMO BILATERAL  Result Date: 10/28/2019 CLINICAL DATA:  Routine annual examination of both breasts and final 2 year follow-up of a probably benign mass, likely a fibroadenoma, in the 12 o'clock position of the left breast. EXAM: DIGITAL DIAGNOSTIC BILATERAL MAMMOGRAM WITH CAD AND TOMO ULTRASOUND LEFT BREAST COMPARISON:  Previous exam(s). ACR Breast Density Category c: The breast tissue is heterogeneously dense, which may obscure small masses. FINDINGS: No mass, architectural distortion, or suspicious microcalcification is identified to suggest malignancy in either breast. Mammographic images were processed with CAD. Targeted ultrasound is performed, showing an oval parallel circumscribed gently  lobulated oval mass at 12 o'clock position 1 cm from the nipple in the left breast. Mass measures 10 x 4 x 9 mm, stable and has appearances consistent with a fibroadenoma. The mass measured 10 x 4 x 9 mm in February of 2019. IMPRESSION: Two year stability of a left breast mass at 12 o'clock position. This mass can be considered benign and has an appearance suggestive of a fibroadenoma. No evidence of malignancy in either breast. RECOMMENDATION: Screening mammogram in one year.(Code:SM-B-01Y) I have discussed the findings and recommendations with the patient. If applicable, a reminder letter will be sent to the patient regarding the next appointment. BI-RADS CATEGORY  2: Benign. Electronically Signed   By: Curlene Dolphin M.D.   On: 10/28/2019 15:16    Assessment & Plan:   Felicia Johnson was seen today for hypertension.  Diagnoses and all orders for this visit:  Borderline high blood pressure- She appears to have developed stage I hypertension.  She thinks this is the whitecoat phenomenon.  Labs are negative for secondary  causes or endorgan damage.  I will treat the vitamin D deficiency.  She will improve her lifestyle modifications and will return in 3 to 4 months for a recheck of her blood pressure. -     CBC with Differential/Platelet; Future -     Basic metabolic panel; Future -     TSH; Future -     Urinalysis, Routine w reflex microscopic; Future -     VITAMIN D 25 Hydroxy (Vit-D Deficiency, Fractures); Future -     Hepatic function panel; Future -     Hepatic function panel -     VITAMIN D 25 Hydroxy (Vit-D Deficiency, Fractures) -     Urinalysis, Routine w reflex microscopic -     TSH -     Basic metabolic panel -     CBC with Differential/Platelet  Screening for tuberculosis -     QuantiFERON-TB Gold Plus; Future -     QuantiFERON-TB Gold Plus  Immunity to varicella determined by serologic test -     Varicella zoster antibody, IgG; Future -     Varicella zoster antibody, IgG  Need for hepatitis B screening test -     Hepatitis B surface antibody,quantitative; Future -     Hepatitis B surface antibody,quantitative  Vitamin D deficiency -     Cholecalciferol 50 MCG (2000 UT) TABS; Take 1 tablet (2,000 Units total) by mouth daily.   I have discontinued Felicia Johnson's pseudoephedrine. I am also having her start on Cholecalciferol. Additionally, I am having her maintain her fluticasone, levocetirizine, montelukast, and pantoprazole.  Meds ordered this encounter  Medications  . Cholecalciferol 50 MCG (2000 UT) TABS    Sig: Take 1 tablet (2,000 Units total) by mouth daily.    Dispense:  90 tablet    Refill:  1     Follow-up: Return in about 3 months (around 03/27/2020).  Scarlette Calico, MD

## 2019-12-29 ENCOUNTER — Encounter: Payer: Self-pay | Admitting: Internal Medicine

## 2019-12-29 LAB — QUANTIFERON-TB GOLD PLUS
Mitogen-NIL: 8.85 IU/mL
NIL: 0.01 IU/mL
QuantiFERON-TB Gold Plus: NEGATIVE
TB1-NIL: 0 IU/mL
TB2-NIL: 0 IU/mL

## 2019-12-29 LAB — VARICELLA ZOSTER ANTIBODY, IGG: Varicella IgG: 1360 index

## 2019-12-29 LAB — HEPATITIS B SURFACE ANTIBODY, QUANTITATIVE: Hep B S AB Quant (Post): 163 m[IU]/mL (ref >=10)

## 2020-01-29 ENCOUNTER — Other Ambulatory Visit: Payer: Self-pay | Admitting: Internal Medicine

## 2020-01-29 DIAGNOSIS — J301 Allergic rhinitis due to pollen: Secondary | ICD-10-CM

## 2020-01-29 MED FILL — FLUTICASONE PROP 50 MCG SPR: 50 | 30 days supply | Qty: 16 | Fill #5

## 2020-01-29 MED FILL — MONTELUKAST SOD 10 MG TAB: 10 | 90 days supply | Qty: 90 | Fill #0

## 2020-01-29 MED FILL — LEVOCETIRIZINE 5 MG TABLET: 5 | 90 days supply | Qty: 90 | Fill #0

## 2020-01-29 MED FILL — PANTOPRAZOLE SOD DR 40 MG T: 40 | 90 days supply | Qty: 90 | Fill #1

## 2020-02-08 MED FILL — FLUTICASONE PROP 50 MCG SPR: 50 | 30 days supply | Qty: 16 | Fill #5

## 2020-02-08 MED FILL — MONTELUKAST SOD 10 MG TAB: 10 | 90 days supply | Qty: 90 | Fill #0

## 2020-02-08 MED FILL — PANTOPRAZOLE SOD DR 40 MG T: 40 | 90 days supply | Qty: 90 | Fill #1

## 2020-02-08 MED FILL — LEVOCETIRIZINE 5 MG TABLET: 5 | 90 days supply | Qty: 90 | Fill #0

## 2020-02-28 MED FILL — METRONIDAZOLE 500 MG TABS: 500 | 7 days supply | Qty: 14 | Fill #0

## 2020-02-28 MED FILL — FLUCONAZOLE 150 MG TABS: 150 | 6 days supply | Qty: 2 | Fill #0

## 2020-03-16 MED FILL — FLUTICASONE PROP 50 MCG SPR: 50 | 30 days supply | Qty: 16 | Fill #6

## 2020-05-17 MED FILL — LEVOCETIRIZINE 5 MG TABLET: 5 | 90 days supply | Qty: 90 | Fill #1

## 2020-05-18 ENCOUNTER — Other Ambulatory Visit: Payer: Self-pay | Admitting: Family

## 2020-05-18 ENCOUNTER — Other Ambulatory Visit: Payer: Self-pay | Admitting: Internal Medicine

## 2020-05-18 DIAGNOSIS — K2100 Gastro-esophageal reflux disease with esophagitis, without bleeding: Secondary | ICD-10-CM

## 2020-05-18 MED FILL — PANTOPRAZOLE SOD DR 40 MG T: 40 | 90 days supply | Qty: 90 | Fill #0

## 2020-05-18 MED FILL — MONTELUKAST SOD 10 MG TAB: 10 | 90 days supply | Qty: 90 | Fill #1

## 2020-05-18 MED FILL — FLUTICASONE PROP 50 MCG SPR: 50 | 30 days supply | Qty: 16 | Fill #0

## 2020-05-22 ENCOUNTER — Telehealth: Payer: Self-pay | Admitting: Internal Medicine

## 2020-05-22 NOTE — Telephone Encounter (Signed)
Patient called and stated she dropped paperwork off for School as well as requested her TB shot record for work on Tuesday the 5th and still has not received anything or gotten any calls.  Can contact her @ [phone]

## 2020-05-25 NOTE — Telephone Encounter (Signed)
Called pt, LVM stating form is ready for pick up.  Located at front desk.

## 2020-07-06 ENCOUNTER — Other Ambulatory Visit (HOSPITAL_COMMUNITY): Payer: Self-pay | Admitting: Obstetrics and Gynecology

## 2020-07-06 MED FILL — FLUCONAZOLE 150 MG TABS: 150 | 6 days supply | Qty: 2 | Fill #0

## 2020-07-11 MED FILL — FLUTICASONE PROP 50 MCG SPR: 50 | 30 days supply | Qty: 16 | Fill #1

## 2020-08-04 MED FILL — PANTOPRAZOLE SOD DR 40 MG T: 40 | 90 days supply | Qty: 90 | Fill #1

## 2020-08-26 ENCOUNTER — Other Ambulatory Visit: Payer: Self-pay | Admitting: Internal Medicine

## 2020-08-26 DIAGNOSIS — J301 Allergic rhinitis due to pollen: Secondary | ICD-10-CM

## 2020-08-26 MED FILL — LEVOCETIRIZINE 5 MG TABLET: 5 | 90 days supply | Qty: 90 | Fill #0

## 2020-09-02 ENCOUNTER — Other Ambulatory Visit: Payer: Self-pay | Admitting: Internal Medicine

## 2020-09-02 DIAGNOSIS — J301 Allergic rhinitis due to pollen: Secondary | ICD-10-CM

## 2020-09-02 MED FILL — FLUTICASONE PROP 50 MCG SPR: 50 | 30 days supply | Qty: 16 | Fill #2

## 2020-09-02 MED FILL — MONTELUKAST SOD 10 MG TAB: 10 | 90 days supply | Qty: 90 | Fill #0

## 2020-09-25 ENCOUNTER — Other Ambulatory Visit (HOSPITAL_COMMUNITY): Payer: Self-pay | Admitting: Obstetrics and Gynecology

## 2020-09-25 MED FILL — FLUCONAZOLE 150 MG TABS: 150 | 9 days supply | Qty: 3 | Fill #0

## 2020-10-15 ENCOUNTER — Other Ambulatory Visit: Payer: Self-pay | Admitting: Obstetrics and Gynecology

## 2020-10-15 DIAGNOSIS — Z1231 Encounter for screening mammogram for malignant neoplasm of breast: Secondary | ICD-10-CM

## 2020-11-18 ENCOUNTER — Telehealth: Payer: Self-pay

## 2020-11-18 ENCOUNTER — Encounter: Payer: Self-pay | Admitting: Internal Medicine

## 2020-11-18 ENCOUNTER — Ambulatory Visit (INDEPENDENT_AMBULATORY_CARE_PROVIDER_SITE_OTHER): Payer: No Typology Code available for payment source

## 2020-11-18 ENCOUNTER — Other Ambulatory Visit: Payer: Self-pay

## 2020-11-18 ENCOUNTER — Ambulatory Visit (INDEPENDENT_AMBULATORY_CARE_PROVIDER_SITE_OTHER): Payer: No Typology Code available for payment source | Admitting: Internal Medicine

## 2020-11-18 VITALS — BP 122/76 | HR 100 | Temp 98.7°F | Resp 16 | Ht 63.0 in | Wt 155.0 lb

## 2020-11-18 DIAGNOSIS — R1905 Periumbilic swelling, mass or lump: Secondary | ICD-10-CM | POA: Insufficient documentation

## 2020-11-18 DIAGNOSIS — K2100 Gastro-esophageal reflux disease with esophagitis, without bleeding: Secondary | ICD-10-CM

## 2020-11-18 DIAGNOSIS — R0789 Other chest pain: Secondary | ICD-10-CM

## 2020-11-18 DIAGNOSIS — D539 Nutritional anemia, unspecified: Secondary | ICD-10-CM | POA: Insufficient documentation

## 2020-11-18 DIAGNOSIS — R002 Palpitations: Secondary | ICD-10-CM | POA: Diagnosis not present

## 2020-11-18 DIAGNOSIS — R000 Tachycardia, unspecified: Secondary | ICD-10-CM | POA: Insufficient documentation

## 2020-11-18 DIAGNOSIS — R0782 Intercostal pain: Secondary | ICD-10-CM | POA: Insufficient documentation

## 2020-11-18 LAB — CBC WITH DIFFERENTIAL/PLATELET
Basophils Absolute: 0 10*3/uL (ref 0.0–0.1)
Basophils Relative: 0.4 % (ref 0.0–3.0)
Eosinophils Absolute: 0 10*3/uL (ref 0.0–0.7)
Eosinophils Relative: 0.5 % (ref 0.0–5.0)
HCT: 34.8 % — ABNORMAL LOW (ref 36.0–46.0)
Hemoglobin: 11.4 g/dL — ABNORMAL LOW (ref 12.0–15.0)
Lymphocytes Relative: 35.8 % (ref 12.0–46.0)
Lymphs Abs: 2.2 10*3/uL (ref 0.7–4.0)
MCHC: 32.7 g/dL (ref 30.0–36.0)
MCV: 82.3 fl (ref 78.0–100.0)
Monocytes Absolute: 0.5 10*3/uL (ref 0.1–1.0)
Monocytes Relative: 8 % (ref 3.0–12.0)
Neutro Abs: 3.4 10*3/uL (ref 1.4–7.7)
Neutrophils Relative %: 55.3 % (ref 43.0–77.0)
Platelets: 305 10*3/uL (ref 150.0–400.0)
RBC: 4.23 Mil/uL (ref 3.87–5.11)
RDW: 18 % — ABNORMAL HIGH (ref 11.5–15.5)
WBC: 6.1 10*3/uL (ref 4.0–10.5)

## 2020-11-18 LAB — BASIC METABOLIC PANEL
BUN: 8 mg/dL (ref 6–23)
CO2: 25 mEq/L (ref 19–32)
Calcium: 9 mg/dL (ref 8.4–10.5)
Chloride: 105 mEq/L (ref 96–112)
Creatinine, Ser: 0.93 mg/dL (ref 0.40–1.20)
GFR: 75.44 mL/min (ref >60.00)
Glucose, Bld: 84 mg/dL (ref 70–99)
Potassium: 3.6 mEq/L (ref 3.5–5.1)
Sodium: 137 mEq/L (ref 135–145)

## 2020-11-18 LAB — HEPATIC FUNCTION PANEL
ALT: 10 U/L (ref 0–35)
AST: 13 U/L (ref 0–37)
Albumin: 3.8 g/dL (ref 3.5–5.2)
Alkaline Phosphatase: 36 U/L — ABNORMAL LOW (ref 39–117)
Bilirubin, Direct: 0.1 mg/dL (ref 0.0–0.3)
Total Bilirubin: 0.3 mg/dL (ref 0.2–1.2)
Total Protein: 7.3 g/dL (ref 6.0–8.3)

## 2020-11-18 LAB — URINALYSIS, ROUTINE W REFLEX MICROSCOPIC
Bilirubin Urine: NEGATIVE
Hgb urine dipstick: NEGATIVE
Leukocytes,Ua: NEGATIVE
Nitrite: NEGATIVE
Specific Gravity, Urine: 1.01 (ref 1.000–1.030)
Total Protein, Urine: NEGATIVE
Urine Glucose: NEGATIVE
Urobilinogen, UA: 0.2 (ref 0.0–1.0)
pH: 5.5 (ref 5.0–8.0)

## 2020-11-18 LAB — POCT URINE PREGNANCY: Preg Test, Ur: NEGATIVE

## 2020-11-18 LAB — HCG, QUANTITATIVE, PREGNANCY: Quantitative HCG: 1.46 m[IU]/mL

## 2020-11-18 LAB — TSH: TSH: 1.22 u[IU]/mL (ref 0.35–4.50)

## 2020-11-18 LAB — D-DIMER, QUANTITATIVE: D-Dimer, Quant: 0.56 mcg/mL FEU — ABNORMAL HIGH (ref <0.50)

## 2020-11-18 NOTE — Progress Notes (Signed)
Subjective:  Patient ID: Felicia Johnson, female    DOB: Mar 27, 1976  Age: 45 y.o. MRN: [id number]  CC: Palpitations  This visit occurred during the SARS-CoV-2 public health emergency.  Safety protocols were in place, including screening questions prior to the visit, additional usage of staff PPE, and extensive cleaning of exam room while observing appropriate contact time as indicated for disinfecting solutions.    HPI Felicia Johnson presents for f/up -   She tells me she had a COVID-19 infection about 2 or 3 months ago.  Since then she has had intermittent episodes of palpitations with a sensation that her heart rate is elevated.  When this happens she feels anxious.  The last episode that this occurred was a week ago.  She says the elevated heart rate always occurs at rest.  She complains of musculoskeletal pain over her right chest wall and right flank.  She has had this intermittently for several months.  She says the pain is exacerbated by laying on her right side.  She said she does not get much symptom relief with ibuprofen and Tylenol.  She complains of burping and heartburn but denies odynophagia or dysphagia.  She tells me she recently saw her gynecologist.  She has had irregular, long, and heavy periods recently.  Outpatient Medications Prior to Visit  Medication Sig Dispense Refill  . Cholecalciferol 50 MCG (2000 UT) TABS Take 1 tablet (2,000 Units total) by mouth daily. 90 tablet 1  . fluticasone (FLONASE) 50 MCG/ACT nasal spray PLACE 2 SPRAYS INTO BOTH NOSTRILS DAILY 16 g 6  . levocetirizine (XYZAL) 5 MG tablet TAKE 1 TABLET BY MOUTH EVERY EVENING 90 tablet 1  . montelukast (SINGULAIR) 10 MG tablet TAKE 1 TABLET BY MOUTH AT BEDTIME 90 tablet 1  . fluconazole (DIFLUCAN) 150 MG tablet TAKE 1 TABLET BY MOUTH EVERY 3 DAYS AS DIRECTED FOR 9 DAYS 3 tablet 0  . fluconazole (DIFLUCAN) 150 MG tablet TAKE 1 TABLET BY MOUTH EVERY 72 HOURS AS DIRECTED. 2 tablet 0  . pantoprazole (PROTONIX) 40  MG tablet TAKE 1 TABLET BY MOUTH ONCE DAILY 90 tablet 1   No facility-administered medications prior to visit.    ROS Review of Systems  Constitutional: Negative for appetite change, chills, diaphoresis, fatigue and fever.  HENT: Negative.  Negative for trouble swallowing and voice change.   Eyes: Negative.   Respiratory: Negative for cough, chest tightness, shortness of breath and wheezing.   Cardiovascular: Positive for chest pain and palpitations. Negative for leg swelling.  Gastrointestinal: Negative for abdominal pain, constipation, diarrhea, nausea and vomiting.  Endocrine: Negative.   Genitourinary: Positive for flank pain. Negative for decreased urine volume, difficulty urinating, dysuria, hematuria and urgency.  Musculoskeletal: Negative for arthralgias, back pain, myalgias and neck pain.  Skin: Negative.  Negative for color change and rash.  Neurological: Negative.  Negative for dizziness, weakness, light-headedness, numbness and headaches.  Hematological: Negative for adenopathy. Does not bruise/bleed easily.  Psychiatric/Behavioral: Negative for suicidal ideas. The patient is nervous/anxious.     Objective:  BP 122/76   Pulse 100   Temp 98.7 F (37.1 C) (Oral)   Resp 16   Ht 5\' 3"  (1.6 m)   Wt 155 lb (70.3 kg)   LMP 11/02/2020 Comment: BTL  SpO2 99%   BMI 27.46 kg/m   BP Readings from Last 3 Encounters:  11/18/20 122/76  12/26/19 (!) 140/94  08/28/19 118/80    Wt Readings from Last 3 Encounters:  11/18/20 155  lb (70.3 kg)  12/26/19 161 lb (73 kg)  08/28/19 160 lb 9.6 oz (72.8 kg)    Physical Exam Vitals reviewed.  Constitutional:      Appearance: Normal appearance.  HENT:     Nose: Nose normal.     Mouth/Throat:     Mouth: Mucous membranes are moist.  Eyes:     General: No scleral icterus.    Conjunctiva/sclera: Conjunctivae normal.  Cardiovascular:     Rate and Rhythm: Normal rate and regular rhythm.     Heart sounds: Normal heart sounds, S1  normal and S2 normal. No murmur heard. No gallop.      Comments: EKG- NSR, 98 bpm Normal EKG Pulmonary:     Effort: Pulmonary effort is normal.     Breath sounds: No stridor. No wheezing, rhonchi or rales.  Abdominal:     General: Abdomen is protuberant. Bowel sounds are normal. There is no distension.     Palpations: Abdomen is soft. There is no hepatomegaly, splenomegaly or mass.     Tenderness: There is no abdominal tenderness.    Musculoskeletal:        General: Normal range of motion.     Cervical back: Neck supple.     Right lower leg: No edema.     Left lower leg: No edema.  Lymphadenopathy:     Cervical: No cervical adenopathy.  Skin:    General: Skin is warm.     Findings: No lesion or rash.  Neurological:     General: No focal deficit present.     Mental Status: She is alert.  Psychiatric:        Mood and Affect: Mood normal.        Behavior: Behavior normal.     Lab Results  Component Value Date   WBC 6.1 11/18/2020   HGB 11.4 (L) 11/18/2020   HCT 34.8 (L) 11/18/2020   PLT 305.0 11/18/2020   GLUCOSE 84 11/18/2020   CHOL 211 (H) 02/06/2019   TRIG 63.0 02/06/2019   HDL 69.40 02/06/2019   LDLDIRECT 134.7 05/10/2013   LDLCALC 129 (H) 02/06/2019   ALT 10 11/18/2020   AST 13 11/18/2020   NA 137 11/18/2020   K 3.6 11/18/2020   CL 105 11/18/2020   CREATININE 0.93 11/18/2020   BUN 8 11/18/2020   CO2 25 11/18/2020   TSH 1.22 11/18/2020    US BREAST LTD UNI LEFT INC AXILLA  Result Date: 10/28/2019 CLINICAL DATA:  Routine annual examination of both breasts and final 2 year follow-up of a probably benign mass, likely a fibroadenoma, in the 12 o'clock position of the left breast. EXAM: DIGITAL DIAGNOSTIC BILATERAL MAMMOGRAM WITH CAD AND TOMO ULTRASOUND LEFT BREAST COMPARISON:  Previous exam(s). ACR Breast Density Category c: The breast tissue is heterogeneously dense, which may obscure small masses. FINDINGS: No mass, architectural distortion, or suspicious  microcalcification is identified to suggest malignancy in either breast. Mammographic images were processed with CAD. Targeted ultrasound is performed, showing an oval parallel circumscribed gently lobulated oval mass at 12 o'clock position 1 cm from the nipple in the left breast. Mass measures 10 x 4 x 9 mm, stable and has appearances consistent with a fibroadenoma. The mass measured 10 x 4 x 9 mm in February of 2019. IMPRESSION: Two year stability of a left breast mass at 12 o'clock position. This mass can be considered benign and has an appearance suggestive of a fibroadenoma. No evidence of malignancy in either breast. RECOMMENDATION: Screening mammogram  in one year.(Code:SM-B-01Y) I have discussed the findings and recommendations with the patient. If applicable, a reminder letter will be sent to the patient regarding the next appointment. BI-RADS CATEGORY  2: Benign. Electronically Signed   By: Curlene Dolphin M.D.   On: 10/28/2019 15:16   MM DIAG BREAST TOMO BILATERAL  Result Date: 10/28/2019 CLINICAL DATA:  Routine annual examination of both breasts and final 2 year follow-up of a probably benign mass, likely a fibroadenoma, in the 12 o'clock position of the left breast. EXAM: DIGITAL DIAGNOSTIC BILATERAL MAMMOGRAM WITH CAD AND TOMO ULTRASOUND LEFT BREAST COMPARISON:  Previous exam(s). ACR Breast Density Category c: The breast tissue is heterogeneously dense, which may obscure small masses. FINDINGS: No mass, architectural distortion, or suspicious microcalcification is identified to suggest malignancy in either breast. Mammographic images were processed with CAD. Targeted ultrasound is performed, showing an oval parallel circumscribed gently lobulated oval mass at 12 o'clock position 1 cm from the nipple in the left breast. Mass measures 10 x 4 x 9 mm, stable and has appearances consistent with a fibroadenoma. The mass measured 10 x 4 x 9 mm in February of 2019. IMPRESSION: Two year stability of a left  breast mass at 12 o'clock position. This mass can be considered benign and has an appearance suggestive of a fibroadenoma. No evidence of malignancy in either breast. RECOMMENDATION: Screening mammogram in one year.(Code:SM-B-01Y) I have discussed the findings and recommendations with the patient. If applicable, a reminder letter will be sent to the patient regarding the next appointment. BI-RADS CATEGORY  2: Benign. Electronically Signed   By: Curlene Dolphin M.D.   On: 10/28/2019 15:16   No results found.  Assessment & Plan:   Jameeka was seen today for palpitations.  Diagnoses and all orders for this visit:  Rapid palpitations- She is anemic.  This could be contributing to her palpitations.  Labs are otherwise unremarkable.  I recommended that she wear a cardiac event monitor to try to identify what her rhythm is while she is experiencing an elevated heart rate. -     TSH; Future -     CBC with Differential/Platelet; Future -     Basic metabolic panel; Future -     EKG 12-Lead -     D-dimer, quantitative; Future -     D-dimer, quantitative -     Basic metabolic panel -     CBC with Differential/Platelet -     TSH -     CARDIAC EVENT MONITOR; Future  Tachycardia- See above. -     TSH; Future -     CBC with Differential/Platelet; Future -     Basic metabolic panel; Future -     EKG 12-Lead -     D-dimer, quantitative; Future -     D-dimer, quantitative -     Basic metabolic panel -     CBC with Differential/Platelet -     TSH -     CARDIAC EVENT MONITOR; Future  Periumbilical mass- Pregnancy test is negative.  She is anemic but the other labs are unremarkable.  This shows up on the plain films as an 18 cm mass most likely originating from the uterus.  She likely has large fibroids.  I recommended that she undergo a CT scan of the abdomen and chest with contrast to see if this is indeed fibroids and to screen for malignancy, endometriosis, and infection. -     Urinalysis, Routine w  reflex microscopic; Future -  Hepatic function panel; Future -     hCG, quantitative, pregnancy; Future -     DG ABD ACUTE 2+V W 1V CHEST; Future -     POCT urine pregnancy -     hCG, quantitative, pregnancy -     Hepatic function panel -     Urinalysis, Routine w reflex microscopic -     CT Chest W Contrast; Future -     CT Abdomen Pelvis W Contrast; Future  Right-sided chest wall pain- Her plain films are unremarkable.  This is likely musculoskeletal chest wall pain.  She will undergo a CT with contrast to see if there is any other cause to explain this. -     DG ABD ACUTE 2+V W 1V CHEST; Future -     D-dimer, quantitative; Future -     D-dimer, quantitative -     CT Chest W Contrast; Future  Intercostal pain- See above. -     CT Chest W Contrast; Future   I have discontinued Kimberlynn A. Hackler's fluconazole and fluconazole. I am also having her maintain her Cholecalciferol, montelukast, levocetirizine, fluticasone, and pantoprazole.  Meds ordered this encounter  Medications  . pantoprazole (PROTONIX) 40 MG tablet    Sig: TAKE 1 TABLET BY MOUTH ONCE DAILY    Dispense:  90 tablet    Refill:  1   I spent 45 minutes in preparing to see the patient by review of recent labs and  images, obtaining and reviewing separately obtained history, communicating with the patient, ordering medications, tests or procedures, and documenting clinical information in the EHR including the differential Dx, treatment, and any further evaluation and other management of 1. Rapid palpitations 2. Tachycardia 3. Periumbilical mass 4. Right-sided chest wall pain 5. Intercostal pain 6. Deficiency anemia 7. Gastroesophageal reflux disease with esophagitis 8. Gastroesophageal reflux disease with esophagitis without hemorrhage     Follow-up: Return in about 3 weeks (around 12/09/2020).  Scarlette Calico, MD

## 2020-11-18 NOTE — Telephone Encounter (Signed)
IMPRESSION: Abnormal soft tissue density in central abdomen/upper pelvis, 18.2 cm transverse, displacing bowel loops, question distended urinary bladder versus enlarged uterus versus abdominopelvic mass; recommend CT imaging with IV and oral contrast to further assess.

## 2020-11-18 NOTE — Patient Instructions (Signed)

## 2020-11-21 ENCOUNTER — Other Ambulatory Visit (HOSPITAL_COMMUNITY): Payer: Self-pay

## 2020-11-21 MED ORDER — PANTOPRAZOLE SODIUM 40 MG PO TBEC
DELAYED_RELEASE_TABLET | Freq: Every day | ORAL | 1 refills | Status: DC
Start: 2020-11-21 — End: 2021-06-17
  Filled 2020-11-21: qty 90, 90d supply, fill #0
  Filled 2021-02-27: qty 90, 90d supply, fill #1

## 2020-11-21 NOTE — Assessment & Plan Note (Signed)
This could be contributing to her symptoms.  I recommended that she restart the PPI.

## 2020-11-21 NOTE — Assessment & Plan Note (Signed)
Have asked her to return to be screened for vitamin deficiencies.

## 2020-11-26 ENCOUNTER — Inpatient Hospital Stay: Admission: RE | Admit: 2020-11-26 | Payer: No Typology Code available for payment source | Source: Ambulatory Visit

## 2020-11-30 ENCOUNTER — Other Ambulatory Visit (HOSPITAL_COMMUNITY): Payer: Self-pay

## 2020-11-30 MED FILL — Levocetirizine Dihydrochloride Tab 5 MG: ORAL | 90 days supply | Qty: 90 | Fill #0 | Status: AC

## 2020-12-02 ENCOUNTER — Ambulatory Visit (INDEPENDENT_AMBULATORY_CARE_PROVIDER_SITE_OTHER)
Admission: RE | Admit: 2020-12-02 | Discharge: 2020-12-02 | Disposition: A | Discharge status: Home / Self Care | Payer: No Typology Code available for payment source | Source: Ambulatory Visit | Attending: Internal Medicine | Admitting: Internal Medicine

## 2020-12-02 ENCOUNTER — Other Ambulatory Visit: Payer: Self-pay

## 2020-12-02 DIAGNOSIS — R1905 Periumbilic swelling, mass or lump: Secondary | ICD-10-CM | POA: Diagnosis not present

## 2020-12-02 DIAGNOSIS — R0782 Intercostal pain: Secondary | ICD-10-CM | POA: Diagnosis not present

## 2020-12-02 DIAGNOSIS — R0789 Other chest pain: Secondary | ICD-10-CM | POA: Diagnosis not present

## 2020-12-02 MED ORDER — IOHEXOL 300 MG/ML  SOLN
100.0000 mL | Freq: Once | INTRAMUSCULAR | Status: AC | PRN
  Administered 2020-12-02: 100 mL via INTRAVENOUS

## 2020-12-04 MED FILL — Fluticasone Propionate Nasal Susp 50 MCG/ACT: NASAL | 30 days supply | Qty: 16 | Fill #0 | Status: AC

## 2020-12-05 ENCOUNTER — Other Ambulatory Visit (HOSPITAL_COMMUNITY): Payer: Self-pay

## 2020-12-10 ENCOUNTER — Telehealth: Payer: Self-pay | Admitting: Internal Medicine

## 2020-12-10 NOTE — Telephone Encounter (Signed)
Pt needed a copy of the order that was entered in [place]. I have printed it and placed it up front for her to pick up.

## 2020-12-10 NOTE — Telephone Encounter (Signed)
Patient called and is requesting a call back in regards to the heart monitor. She can be reached at [phone]

## 2020-12-12 ENCOUNTER — Other Ambulatory Visit: Payer: Self-pay

## 2020-12-21 ENCOUNTER — Ambulatory Visit: Payer: No Typology Code available for payment source

## 2020-12-30 ENCOUNTER — Ambulatory Visit (INDEPENDENT_AMBULATORY_CARE_PROVIDER_SITE_OTHER): Payer: No Typology Code available for payment source

## 2020-12-30 DIAGNOSIS — R002 Palpitations: Secondary | ICD-10-CM | POA: Diagnosis not present

## 2020-12-30 DIAGNOSIS — R000 Tachycardia, unspecified: Secondary | ICD-10-CM | POA: Diagnosis not present

## 2020-12-31 ENCOUNTER — Other Ambulatory Visit (HOSPITAL_COMMUNITY): Payer: Self-pay

## 2021-01-12 ENCOUNTER — Other Ambulatory Visit (HOSPITAL_COMMUNITY): Payer: Self-pay

## 2021-01-12 MED FILL — Montelukast Sodium Tab 10 MG (Base Equiv): ORAL | 90 days supply | Qty: 90 | Fill #0 | Status: AC

## 2021-01-15 ENCOUNTER — Other Ambulatory Visit: Payer: Self-pay

## 2021-01-15 ENCOUNTER — Ambulatory Visit
Admission: RE | Admit: 2021-01-15 | Discharge: 2021-01-15 | Disposition: A | Discharge status: Home / Self Care | Payer: No Typology Code available for payment source | Source: Ambulatory Visit | Attending: Obstetrics and Gynecology | Admitting: Obstetrics and Gynecology

## 2021-01-15 DIAGNOSIS — Z1231 Encounter for screening mammogram for malignant neoplasm of breast: Secondary | ICD-10-CM

## 2021-02-08 ENCOUNTER — Other Ambulatory Visit: Payer: Self-pay | Admitting: Internal Medicine

## 2021-02-08 DIAGNOSIS — J301 Allergic rhinitis due to pollen: Secondary | ICD-10-CM

## 2021-02-08 MED ORDER — LEVOCETIRIZINE DIHYDROCHLORIDE 5 MG PO TABS
ORAL_TABLET | Freq: Every evening | ORAL | 1 refills | Status: DC
  Filled 2021-02-08 – 2021-02-09 (×2): qty 90, 90d supply, fill #0
  Filled 2021-05-21 – 2021-06-01 (×2): qty 90, 90d supply, fill #1

## 2021-02-08 MED FILL — Fluticasone Propionate Nasal Susp 50 MCG/ACT: NASAL | 30 days supply | Qty: 16 | Fill #1 | Status: AC

## 2021-02-09 ENCOUNTER — Other Ambulatory Visit (HOSPITAL_COMMUNITY): Payer: Self-pay

## 2021-02-27 ENCOUNTER — Other Ambulatory Visit (HOSPITAL_COMMUNITY): Payer: Self-pay

## 2021-03-04 DIAGNOSIS — D219 Benign neoplasm of connective and other soft tissue, unspecified: Secondary | ICD-10-CM | POA: Insufficient documentation

## 2021-03-20 MED FILL — Fluticasone Propionate Nasal Susp 50 MCG/ACT: NASAL | 30 days supply | Qty: 16 | Fill #2 | Status: AC

## 2021-03-22 ENCOUNTER — Other Ambulatory Visit (HOSPITAL_COMMUNITY): Payer: Self-pay

## 2021-03-26 ENCOUNTER — Other Ambulatory Visit: Payer: Self-pay

## 2021-03-26 ENCOUNTER — Telehealth: Payer: No Typology Code available for payment source | Admitting: Family

## 2021-04-08 NOTE — Progress Notes (Signed)
Subjective:    Patient ID: Felicia Johnson, female    DOB: 1975-09-20, 45 y.o.   MRN: [id number]  HPI The patient is here for an acute visit.   Ringing in both ears x 3 weeks -  it has happened before - it was related to allergies  She thought it was her sinuses now -  she has PND, itching, ear pain, sinus pressure.  Taking allergy medications that are not helping.   She was concerned it was something more serious.    Medications and allergies reviewed with patient and updated if appropriate.  Patient Active Problem List   Diagnosis Date Noted   Tachycardia 11/18/2020   Rapid palpitations 99991111   Periumbilical mass 99991111   Right-sided chest wall pain 11/18/2020   Intercostal pain 11/18/2020   Deficiency anemia 11/18/2020   Borderline high blood pressure 12/26/2019   Screening for tuberculosis 12/26/2019   Eustachian salpingitis, acute, bilateral 08/28/2019   GERD (gastroesophageal reflux disease) 10/28/2016   Allergic rhinitis 05/10/2013   Routine general medical examination at a health care facility 10/31/2011    Current Outpatient Medications on File Prior to Visit  Medication Sig Dispense Refill   Cholecalciferol 50 MCG (2000 UT) TABS Take 1 tablet (2,000 Units total) by mouth daily. 90 tablet 1   fluticasone (FLONASE) 50 MCG/ACT nasal spray PLACE 2 SPRAYS INTO BOTH NOSTRILS DAILY 16 g 6   levocetirizine (XYZAL) 5 MG tablet Take 1 tablet by mouth every evening. 90 tablet 1   montelukast (SINGULAIR) 10 MG tablet TAKE 1 TABLET BY MOUTH AT BEDTIME 90 tablet 1   pantoprazole (PROTONIX) 40 MG tablet TAKE 1 TABLET BY MOUTH ONCE DAILY 90 tablet 1   No current facility-administered medications on file prior to visit.    Past Medical History:  Diagnosis Date   Acid reflux    Hyperlipidemia     Past Surgical History:  Procedure Laterality Date   TUBAL LIGATION      Social History   Socioeconomic History   Marital status: Married    Spouse name: Not on file    Number of children: Not on file   Years of education: Not on file   Highest education level: Not on file  Occupational History   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[employer]  Tobacco Use   Smoking status: Never   Smokeless tobacco: Never  Vaping Use   Vaping Use: Never used  Substance and Sexual Activity   Alcohol use: No   Drug use: No   Sexual activity: Yes    Birth control/protection: Surgical  Other Topics Concern   Not on file  Social History Narrative   Regular Exercise -  NO         Social Determinants of Health   Financial Resource Strain: Not on file  Food Insecurity: Not on file  Transportation Needs: Not on file  Physical Activity: Not on file  Stress: Not on file  Social Connections: Not on file    Family History  Problem Relation Age of Onset   Hypertension Mother    Stroke Other    Cancer Neg Hx    Heart disease Neg Hx    Hyperlipidemia Neg Hx    Diabetes Neg Hx    Drug abuse Neg Hx    Alcohol abuse Neg Hx    Kidney disease Neg Hx     Review of Systems  Constitutional:  Negative for chills and fever.  HENT:  Positive for congestion, ear  pain, postnasal drip, sinus pain and tinnitus. Negative for rhinorrhea.   Respiratory:  Negative for cough, shortness of breath and wheezing.   Neurological:  Positive for headaches (mild). Negative for dizziness.      Objective:   Vitals:   04/09/21 1424  BP: 124/82  Pulse: 100  Temp: 98.2 F (36.8 C)  SpO2: 97%   BP Readings from Last 3 Encounters:  04/09/21 124/82  11/18/20 122/76  12/26/19 (!) 140/94   Wt Readings from Last 3 Encounters:  04/09/21 157 lb 12.8 oz (71.6 kg)  11/18/20 155 lb (70.3 kg)  12/26/19 161 lb (73 kg)   Body mass index is 27.95 kg/m.   Physical Exam    GENERAL APPEARANCE: Appears stated age, well appearing, NAD EYES: conjunctiva clear, no icterus HENT: bilateral tympanic membranes and ear canals normal, oropharynx with no erythema or exudates, trachea midline, no cervical or  supraclavicular lymphadenopathy LUNGS: Unlabored breathing, good air entry bilaterally, clear to auscultation without wheeze or crackles CARDIOVASCULAR: Normal S1,S2 , no edema SKIN: Warm, dry      Assessment & Plan:    Tinnitus, sinus infection, eustachian tube dysfunction: Acute Discussed the several causes Possibly related to sinus infection/allergies, iron deficiency anemia also a possibility Will check CBC, iron panel Continue allergy medications-Flonase, Xyzal and Singulair Will start Augmentin for possible sinus infection-Diflucan if needed for yeast infection Depo-Medrol 80 mg IM x1 today for sinus infection, eustachian tube dysfunction  Iron deficiency anemia: Chronic Discussed this could potentially cause tinnitus Check CBC, iron panel May need to start daily iron   This visit occurred during the SARS-CoV-2 public health emergency.  Safety protocols were in place, including screening questions prior to the visit, additional usage of staff PPE, and extensive cleaning of exam room while observing appropriate contact time as indicated for disinfecting solutions.

## 2021-04-09 ENCOUNTER — Ambulatory Visit (INDEPENDENT_AMBULATORY_CARE_PROVIDER_SITE_OTHER): Payer: No Typology Code available for payment source | Admitting: Internal Medicine

## 2021-04-09 ENCOUNTER — Other Ambulatory Visit: Payer: Self-pay

## 2021-04-09 ENCOUNTER — Other Ambulatory Visit (HOSPITAL_COMMUNITY): Payer: Self-pay

## 2021-04-09 ENCOUNTER — Encounter: Payer: Self-pay | Admitting: Internal Medicine

## 2021-04-09 VITALS — BP 124/82 | HR 100 | Temp 98.2°F | Ht 63.0 in | Wt 157.8 lb

## 2021-04-09 DIAGNOSIS — J0100 Acute maxillary sinusitis, unspecified: Secondary | ICD-10-CM | POA: Diagnosis not present

## 2021-04-09 DIAGNOSIS — D539 Nutritional anemia, unspecified: Secondary | ICD-10-CM

## 2021-04-09 DIAGNOSIS — H9313 Tinnitus, bilateral: Secondary | ICD-10-CM

## 2021-04-09 DIAGNOSIS — H6983 Other specified disorders of Eustachian tube, bilateral: Secondary | ICD-10-CM | POA: Diagnosis not present

## 2021-04-09 LAB — CBC WITH DIFFERENTIAL/PLATELET
Basophils Absolute: 0 10*3/uL (ref 0.0–0.1)
Basophils Relative: 0.3 % (ref 0.0–3.0)
Eosinophils Absolute: 0.1 10*3/uL (ref 0.0–0.7)
Eosinophils Relative: 1.2 % (ref 0.0–5.0)
HCT: 35.2 % — ABNORMAL LOW (ref 36.0–46.0)
Hemoglobin: 11.1 g/dL — ABNORMAL LOW (ref 12.0–15.0)
Lymphocytes Relative: 39 % (ref 12.0–46.0)
Lymphs Abs: 2.2 10*3/uL (ref 0.7–4.0)
MCHC: 31.5 g/dL (ref 30.0–36.0)
MCV: 80.6 fl (ref 78.0–100.0)
Monocytes Absolute: 0.5 10*3/uL (ref 0.1–1.0)
Monocytes Relative: 8.6 % (ref 3.0–12.0)
Neutro Abs: 2.9 10*3/uL (ref 1.4–7.7)
Neutrophils Relative %: 50.9 % (ref 43.0–77.0)
Platelets: 322 10*3/uL (ref 150.0–400.0)
RBC: 4.37 Mil/uL (ref 3.87–5.11)
RDW: 18.2 % — ABNORMAL HIGH (ref 11.5–15.5)
WBC: 5.8 10*3/uL (ref 4.0–10.5)

## 2021-04-09 MED ORDER — METHYLPREDNISOLONE ACETATE 80 MG/ML IJ SUSP
80.0000 mg | Freq: Once | INTRAMUSCULAR | Status: AC
  Administered 2021-04-09: 80 mg via INTRAMUSCULAR

## 2021-04-09 MED ORDER — FLUCONAZOLE 150 MG PO TABS
150.0000 mg | ORAL_TABLET | Freq: Once | ORAL | 0 refills | Status: AC
  Filled 2021-04-09: qty 1, 1d supply, fill #0

## 2021-04-09 MED ORDER — AMOXICILLIN-POT CLAVULANATE 875-125 MG PO TABS
1.0000 | ORAL_TABLET | Freq: Two times a day (BID) | ORAL | 0 refills | Status: DC
  Filled 2021-04-09: qty 20, 10d supply, fill #0

## 2021-04-09 NOTE — Patient Instructions (Addendum)
  You had a steroid injection.    Blood work was ordered.     Medications changes include :   augmentin for your infection.  Fluconazole for possible yeast infection.    Your prescription(s) have been submitted to your pharmacy. Please take as directed and contact our office if you believe you are having problem(s) with the medication(s).

## 2021-04-10 LAB — IRON,TIBC AND FERRITIN PANEL
%SAT: 8 % (calc) — ABNORMAL LOW (ref 16–45)
Ferritin: 4 ng/mL — ABNORMAL LOW (ref 16–232)
Iron: 31 ug/dL — ABNORMAL LOW (ref 40–190)
TIBC: 406 mcg/dL (calc) (ref 250–450)

## 2021-04-19 ENCOUNTER — Other Ambulatory Visit: Payer: Self-pay | Admitting: Internal Medicine

## 2021-04-19 DIAGNOSIS — J301 Allergic rhinitis due to pollen: Secondary | ICD-10-CM

## 2021-04-20 ENCOUNTER — Other Ambulatory Visit (HOSPITAL_COMMUNITY): Payer: Self-pay

## 2021-04-20 MED ORDER — MONTELUKAST SODIUM 10 MG PO TABS
ORAL_TABLET | Freq: Every day | ORAL | 1 refills | Status: DC
  Filled 2021-04-20: qty 90, 90d supply, fill #0
  Filled 2021-08-03: qty 90, 90d supply, fill #1

## 2021-04-28 ENCOUNTER — Other Ambulatory Visit: Payer: Self-pay | Admitting: Internal Medicine

## 2021-04-28 ENCOUNTER — Other Ambulatory Visit (HOSPITAL_COMMUNITY): Payer: Self-pay

## 2021-04-28 MED ORDER — FLUTICASONE PROPIONATE 50 MCG/ACT NA SUSP
2.0000 | Freq: Every day | NASAL | 6 refills | Status: DC
  Filled 2021-04-28: qty 16, 30d supply, fill #0
  Filled 2021-06-07: qty 16, 30d supply, fill #1
  Filled 2021-07-16: qty 16, 30d supply, fill #2
  Filled 2021-08-23: qty 16, 30d supply, fill #3
  Filled 2021-09-26: qty 16, 30d supply, fill #4
  Filled 2021-11-02: qty 16, 30d supply, fill #5
  Filled 2021-12-09: qty 16, 30d supply, fill #6

## 2021-05-22 ENCOUNTER — Other Ambulatory Visit (HOSPITAL_COMMUNITY): Payer: Self-pay

## 2021-05-31 ENCOUNTER — Encounter: Payer: Self-pay | Admitting: Internal Medicine

## 2021-05-31 ENCOUNTER — Other Ambulatory Visit (HOSPITAL_COMMUNITY): Payer: Self-pay

## 2021-05-31 ENCOUNTER — Other Ambulatory Visit: Payer: Self-pay | Admitting: Internal Medicine

## 2021-05-31 DIAGNOSIS — H9313 Tinnitus, bilateral: Secondary | ICD-10-CM | POA: Insufficient documentation

## 2021-06-01 ENCOUNTER — Other Ambulatory Visit (HOSPITAL_COMMUNITY): Payer: Self-pay

## 2021-06-02 ENCOUNTER — Telehealth: Payer: Self-pay | Admitting: Internal Medicine

## 2021-06-02 NOTE — Telephone Encounter (Signed)
A referral was sent over to Gerhard Perches, MD for ENT. This practice does not accept the insurance that the patient has.

## 2021-06-08 ENCOUNTER — Other Ambulatory Visit (HOSPITAL_COMMUNITY): Payer: Self-pay

## 2021-06-17 ENCOUNTER — Other Ambulatory Visit (HOSPITAL_COMMUNITY): Payer: Self-pay

## 2021-06-17 ENCOUNTER — Other Ambulatory Visit: Payer: Self-pay | Admitting: Internal Medicine

## 2021-06-17 DIAGNOSIS — K2100 Gastro-esophageal reflux disease with esophagitis, without bleeding: Secondary | ICD-10-CM

## 2021-06-17 MED ORDER — PANTOPRAZOLE SODIUM 40 MG PO TBEC
DELAYED_RELEASE_TABLET | Freq: Every day | ORAL | 1 refills | Status: DC
  Filled 2021-06-17: qty 90, 90d supply, fill #0
  Filled 2021-09-26: qty 90, 90d supply, fill #1

## 2021-07-16 ENCOUNTER — Other Ambulatory Visit (HOSPITAL_COMMUNITY): Payer: Self-pay

## 2021-08-04 ENCOUNTER — Other Ambulatory Visit (HOSPITAL_COMMUNITY): Payer: Self-pay

## 2021-08-09 ENCOUNTER — Other Ambulatory Visit (HOSPITAL_COMMUNITY): Payer: Self-pay

## 2021-08-23 ENCOUNTER — Other Ambulatory Visit: Payer: Self-pay | Admitting: Internal Medicine

## 2021-08-23 DIAGNOSIS — J301 Allergic rhinitis due to pollen: Secondary | ICD-10-CM

## 2021-08-23 MED ORDER — LEVOCETIRIZINE DIHYDROCHLORIDE 5 MG PO TABS
ORAL_TABLET | Freq: Every evening | ORAL | 1 refills | Status: DC
  Filled 2021-08-23: qty 90, 90d supply, fill #0
  Filled 2021-11-24: qty 90, 90d supply, fill #1

## 2021-08-24 ENCOUNTER — Other Ambulatory Visit (HOSPITAL_COMMUNITY): Payer: Self-pay

## 2021-09-27 ENCOUNTER — Other Ambulatory Visit (HOSPITAL_COMMUNITY): Payer: Self-pay

## 2021-09-28 ENCOUNTER — Other Ambulatory Visit (HOSPITAL_COMMUNITY): Payer: Self-pay

## 2021-09-28 MED ORDER — CARESTART COVID-19 HOME TEST VI KIT
PACK | 0 refills | Status: DC
  Filled 2021-09-28: qty 4, 4d supply, fill #0

## 2021-11-02 ENCOUNTER — Other Ambulatory Visit (HOSPITAL_COMMUNITY): Payer: Self-pay

## 2021-11-03 ENCOUNTER — Other Ambulatory Visit (HOSPITAL_COMMUNITY): Payer: Self-pay

## 2021-11-08 ENCOUNTER — Other Ambulatory Visit: Payer: Self-pay | Admitting: Internal Medicine

## 2021-11-08 DIAGNOSIS — J301 Allergic rhinitis due to pollen: Secondary | ICD-10-CM

## 2021-11-09 ENCOUNTER — Other Ambulatory Visit (HOSPITAL_COMMUNITY): Payer: Self-pay

## 2021-11-09 MED ORDER — MONTELUKAST SODIUM 10 MG PO TABS
ORAL_TABLET | Freq: Every day | ORAL | 1 refills | Status: DC
  Filled 2021-11-09: qty 90, 90d supply, fill #0
  Filled 2022-02-07: qty 90, 90d supply, fill #1

## 2021-11-25 ENCOUNTER — Other Ambulatory Visit (HOSPITAL_COMMUNITY): Payer: Self-pay

## 2021-11-27 ENCOUNTER — Other Ambulatory Visit (HOSPITAL_COMMUNITY): Payer: Self-pay

## 2021-12-10 ENCOUNTER — Other Ambulatory Visit (HOSPITAL_COMMUNITY): Payer: Self-pay

## 2021-12-29 ENCOUNTER — Other Ambulatory Visit (HOSPITAL_COMMUNITY): Payer: Self-pay

## 2021-12-29 ENCOUNTER — Other Ambulatory Visit: Payer: Self-pay | Admitting: Internal Medicine

## 2021-12-29 DIAGNOSIS — K2100 Gastro-esophageal reflux disease with esophagitis, without bleeding: Secondary | ICD-10-CM

## 2021-12-29 MED ORDER — PANTOPRAZOLE SODIUM 40 MG PO TBEC
DELAYED_RELEASE_TABLET | Freq: Every day | ORAL | 0 refills | Status: DC
  Filled 2021-12-29: qty 90, 90d supply, fill #0

## 2022-01-16 ENCOUNTER — Other Ambulatory Visit: Payer: Self-pay | Admitting: Internal Medicine

## 2022-01-16 DIAGNOSIS — J301 Allergic rhinitis due to pollen: Secondary | ICD-10-CM

## 2022-01-17 ENCOUNTER — Other Ambulatory Visit (HOSPITAL_COMMUNITY): Payer: Self-pay

## 2022-01-17 MED ORDER — FLUTICASONE PROPIONATE 50 MCG/ACT NA SUSP
2.0000 | Freq: Every day | NASAL | 6 refills | Status: DC
  Filled 2022-01-17: qty 16, 30d supply, fill #0

## 2022-02-09 ENCOUNTER — Other Ambulatory Visit (HOSPITAL_COMMUNITY): Payer: Self-pay

## 2022-02-16 ENCOUNTER — Other Ambulatory Visit (HOSPITAL_COMMUNITY): Payer: Self-pay

## 2022-02-16 ENCOUNTER — Encounter: Payer: Self-pay | Admitting: Internal Medicine

## 2022-02-16 ENCOUNTER — Ambulatory Visit (INDEPENDENT_AMBULATORY_CARE_PROVIDER_SITE_OTHER): Payer: No Typology Code available for payment source | Admitting: Internal Medicine

## 2022-02-16 VITALS — BP 136/88 | HR 109 | Temp 98.2°F | Ht 63.0 in | Wt 167.0 lb

## 2022-02-16 DIAGNOSIS — D500 Iron deficiency anemia secondary to blood loss (chronic): Secondary | ICD-10-CM

## 2022-02-16 DIAGNOSIS — J301 Allergic rhinitis due to pollen: Secondary | ICD-10-CM

## 2022-02-16 DIAGNOSIS — H9313 Tinnitus, bilateral: Secondary | ICD-10-CM

## 2022-02-16 DIAGNOSIS — K2100 Gastro-esophageal reflux disease with esophagitis, without bleeding: Secondary | ICD-10-CM | POA: Diagnosis not present

## 2022-02-16 DIAGNOSIS — Z1159 Encounter for screening for other viral diseases: Secondary | ICD-10-CM

## 2022-02-16 DIAGNOSIS — D259 Leiomyoma of uterus, unspecified: Secondary | ICD-10-CM

## 2022-02-16 DIAGNOSIS — D510 Vitamin B12 deficiency anemia due to intrinsic factor deficiency: Secondary | ICD-10-CM | POA: Insufficient documentation

## 2022-02-16 DIAGNOSIS — R000 Tachycardia, unspecified: Secondary | ICD-10-CM | POA: Diagnosis not present

## 2022-02-16 DIAGNOSIS — D539 Nutritional anemia, unspecified: Secondary | ICD-10-CM

## 2022-02-16 DIAGNOSIS — Z0000 Encounter for general adult medical examination without abnormal findings: Secondary | ICD-10-CM | POA: Diagnosis not present

## 2022-02-16 LAB — CBC WITH DIFFERENTIAL/PLATELET
Basophils Absolute: 0 10*3/uL (ref 0.0–0.1)
Basophils Relative: 0.6 % (ref 0.0–3.0)
Eosinophils Absolute: 0.1 10*3/uL (ref 0.0–0.7)
Eosinophils Relative: 1.8 % (ref 0.0–5.0)
HCT: 32.6 % — ABNORMAL LOW (ref 36.0–46.0)
Hemoglobin: 10.4 g/dL — ABNORMAL LOW (ref 12.0–15.0)
Lymphocytes Relative: 40.8 % (ref 12.0–46.0)
Lymphs Abs: 1.7 10*3/uL (ref 0.7–4.0)
MCHC: 32 g/dL (ref 30.0–36.0)
MCV: 76.2 fl — ABNORMAL LOW (ref 78.0–100.0)
Monocytes Absolute: 0.4 10*3/uL (ref 0.1–1.0)
Monocytes Relative: 9.4 % (ref 3.0–12.0)
Neutro Abs: 2 10*3/uL (ref 1.4–7.7)
Neutrophils Relative %: 47.4 % (ref 43.0–77.0)
Platelets: 276 10*3/uL (ref 150.0–400.0)
RBC: 4.28 Mil/uL (ref 3.87–5.11)
RDW: 19.2 % — ABNORMAL HIGH (ref 11.5–15.5)
WBC: 4.2 10*3/uL (ref 4.0–10.5)

## 2022-02-16 LAB — TSH: TSH: 2.11 u[IU]/mL (ref 0.35–5.50)

## 2022-02-16 LAB — IBC + FERRITIN
Ferritin: 4.5 ng/mL — ABNORMAL LOW (ref 10.0–291.0)
Iron: 66 ug/dL (ref 42–145)
Saturation Ratios: 14 % — ABNORMAL LOW (ref 20.0–50.0)
TIBC: 470.4 ug/dL — ABNORMAL HIGH (ref 250.0–450.0)
Transferrin: 336 mg/dL (ref 212.0–360.0)

## 2022-02-16 LAB — BASIC METABOLIC PANEL
BUN: 10 mg/dL (ref 6–23)
CO2: 24 mEq/L (ref 19–32)
Calcium: 8.7 mg/dL (ref 8.4–10.5)
Chloride: 106 mEq/L (ref 96–112)
Creatinine, Ser: 0.85 mg/dL (ref 0.40–1.20)
GFR: 83.31 mL/min (ref >60.00)
Glucose, Bld: 90 mg/dL (ref 70–99)
Potassium: 3.5 mEq/L (ref 3.5–5.1)
Sodium: 137 mEq/L (ref 135–145)

## 2022-02-16 LAB — LIPID PANEL
Cholesterol: 206 mg/dL — ABNORMAL HIGH (ref 0–200)
HDL: 77.8 mg/dL (ref >39.00)
LDL Cholesterol: 111 mg/dL — ABNORMAL HIGH (ref 0–99)
NonHDL: 128.33
Total CHOL/HDL Ratio: 3
Triglycerides: 89 mg/dL (ref 0.0–149.0)
VLDL: 17.8 mg/dL (ref 0.0–40.0)

## 2022-02-16 LAB — VITAMIN B12: Vitamin B-12: 224 pg/mL (ref 211–911)

## 2022-02-16 LAB — HEPATIC FUNCTION PANEL
ALT: 10 U/L (ref 0–35)
AST: 15 U/L (ref 0–37)
Albumin: 4.1 g/dL (ref 3.5–5.2)
Alkaline Phosphatase: 36 U/L — ABNORMAL LOW (ref 39–117)
Bilirubin, Direct: 0 mg/dL (ref 0.0–0.3)
Total Bilirubin: 0.3 mg/dL (ref 0.2–1.2)
Total Protein: 7.4 g/dL (ref 6.0–8.3)

## 2022-02-16 LAB — FOLATE: Folate: 22.2 ng/mL (ref >5.9)

## 2022-02-16 MED ORDER — FLUTICASONE PROPIONATE 50 MCG/ACT NA SUSP
2.0000 | Freq: Every day | NASAL | 1 refills | Status: DC
  Filled 2022-02-16: qty 48, 90d supply, fill #0
  Filled 2022-05-14: qty 48, 90d supply, fill #1

## 2022-02-16 MED ORDER — CYANOCOBALAMIN 2000 MCG PO TABS
2000.0000 ug | ORAL_TABLET | Freq: Every day | ORAL | 1 refills | Status: DC
  Filled 2022-02-16: qty 90, 90d supply, fill #0

## 2022-02-16 MED ORDER — LEVOCETIRIZINE DIHYDROCHLORIDE 5 MG PO TABS
ORAL_TABLET | Freq: Every evening | ORAL | 1 refills | Status: DC
  Filled 2022-02-16: qty 90, 90d supply, fill #0
  Filled 2022-05-14: qty 90, 90d supply, fill #1

## 2022-02-16 MED ORDER — PANTOPRAZOLE SODIUM 40 MG PO TBEC
DELAYED_RELEASE_TABLET | Freq: Every day | ORAL | 0 refills | Status: DC
  Filled 2022-02-16 – 2022-03-25 (×2): qty 90, 90d supply, fill #0

## 2022-02-16 MED ORDER — ACCRUFER 30 MG PO CAPS: 1.0000 | ORAL_CAPSULE | Freq: Two times a day (BID) | ORAL | 1 refills | Status: DC

## 2022-02-16 MED ORDER — MONTELUKAST SODIUM 10 MG PO TABS
10.0000 mg | ORAL_TABLET | Freq: Every day | ORAL | 1 refills | Status: DC
  Filled 2022-02-16 – 2022-05-14 (×2): qty 90, 90d supply, fill #0
  Filled 2022-08-11: qty 90, 90d supply, fill #1

## 2022-02-16 NOTE — Patient Instructions (Signed)
Anemia  Anemia is a condition in which there is not enough red blood cells or hemoglobin in the blood. Hemoglobin is a substance in red blood cells that carries oxygen. When you do not have enough red blood cells or hemoglobin (are anemic), your body cannot get enough oxygen and your organs may not work properly. As a result, you may feel very tired or have other problems. What are the causes? Common causes of anemia include: Excessive bleeding. Anemia can be caused by excessive bleeding inside or outside the body, including bleeding from the intestines or from heavy menstrual periods in females. Poor nutrition. Long-lasting (chronic) kidney, thyroid, and liver disease. Bone marrow disorders, spleen problems, and blood disorders. Cancer and treatments for cancer. HIV (human immunodeficiency virus) and AIDS (acquired immunodeficiency syndrome). Infections, medicines, and autoimmune disorders that destroy red blood cells. What are the signs or symptoms? Symptoms of this condition include: Minor weakness. Dizziness. Headache, or difficulties concentrating and sleeping. Heartbeats that feel irregular or faster than normal (palpitations). Shortness of breath, especially with exercise. Pale skin, lips, and nails, or cold hands and feet. Indigestion and nausea. Symptoms may occur suddenly or develop slowly. If your anemia is mild, you may not have symptoms. How is this diagnosed? This condition is diagnosed based on blood tests, your medical history, and a physical exam. In some cases, a test may be needed in which cells are removed from the soft tissue inside of a bone and looked at under a microscope (bone marrow biopsy). Your health care provider may also check your stool (feces) for blood and may do additional testing to look for the cause of your bleeding. Other tests may include: Imaging tests, such as a CT scan or MRI. A procedure to see inside your esophagus and stomach (endoscopy). A  procedure to see inside your colon and rectum (colonoscopy). How is this treated? Treatment for this condition depends on the cause. If you continue to lose a lot of blood, you may need to be treated at a hospital. Treatment may include: Taking supplements of iron, vitamin B12, or folic acid. Taking a hormone medicine (erythropoietin) that can help to stimulate red blood cell growth. Having a blood transfusion. This may be needed if you lose a lot of blood. Making changes to your diet. Having surgery to remove your spleen. Follow these instructions at home: Take over-the-counter and prescription medicines only as told by your health care provider. Take supplements only as told by your health care provider. Follow any diet instructions that you were given by your health care provider. Keep all follow-up visits as told by your health care provider. This is important. Contact a health care provider if: You develop new bleeding anywhere in the body. Get help right away if: You are very weak. You are short of breath. You have pain in your abdomen or chest. You are dizzy or feel faint. You have trouble concentrating. You have bloody stools, black stools, or tarry stools. You vomit repeatedly or you vomit up blood. These symptoms may represent a serious problem that is an emergency. Do not wait to see if the symptoms will go away. Get medical help right away. Call your local emergency services (911 in the [place] Do not drive yourself to the hospital. Summary Anemia is a condition in which you do not have enough red blood cells or enough of a substance in your red blood cells that carries oxygen (hemoglobin). Symptoms may occur suddenly or develop slowly. If your anemia   is mild, you may not have symptoms. This condition is diagnosed with blood tests, a medical history, and a physical exam. Other tests may be needed. Treatment for this condition depends on the cause of the anemia. This  information is not intended to replace advice given to you by your health care provider. Make sure you discuss any questions you have with your health care provider. Document Revised: 06/08/2021 Document Reviewed: 07/02/2019 Elsevier Patient Education  2023 Elsevier Inc.  

## 2022-02-16 NOTE — Progress Notes (Signed)
Subjective:  Patient ID: Felicia Johnson, female    DOB: 1976-07-20  Age: 46 y.o. MRN: [id number]  CC: Annual Exam and Anemia   HPI Felicia Johnson presents for a CPX and f/up -   She continues to complain of tinnitus.  She wants to see an ENT doctor.  She complains that her menstrual cycles last 7 days and she has heavy bleeding.  The fibroids have not been removed.  She complains of fatigue and shortness of breath.  She denies chest pain, edema, or diaphoresis.  Outpatient Medications Prior to Visit  Medication Sig Dispense Refill   Cholecalciferol 50 MCG (2000 UT) TABS Take 1 tablet (2,000 Units total) by mouth daily. 90 tablet 1   COVID-19 At Home Antigen Test (CARESTART COVID-19 HOME TEST) KIT Use as directed within package instructions 4 each 0   fluticasone (FLONASE) 50 MCG/ACT nasal spray PLACE 2 SPRAYS INTO BOTH NOSTRILS DAILY 16 g 6   levocetirizine (XYZAL) 5 MG tablet Take 1 tablet by mouth every evening. 90 tablet 1   montelukast (SINGULAIR) 10 MG tablet TAKE 1 TABLET BY MOUTH AT BEDTIME 90 tablet 1   pantoprazole (PROTONIX) 40 MG tablet TAKE 1 TABLET BY MOUTH ONCE DAILY 90 tablet 0   No facility-administered medications prior to visit.    ROS Review of Systems  Constitutional:  Positive for fatigue. Negative for chills, diaphoresis and unexpected weight change.  HENT:  Positive for congestion, postnasal drip, rhinorrhea and tinnitus. Negative for ear pain, facial swelling, sinus pressure and sinus pain.   Eyes: Negative.   Respiratory:  Positive for shortness of breath. Negative for cough, chest tightness and wheezing.   Cardiovascular:  Negative for chest pain, palpitations and leg swelling.  Gastrointestinal:  Negative for abdominal pain, blood in stool, constipation, diarrhea, nausea and vomiting.  Endocrine: Negative.   Genitourinary:  Positive for menstrual problem and vaginal bleeding. Negative for difficulty urinating and dysuria.  Musculoskeletal: Negative.    Skin: Negative.  Negative for color change.  Neurological: Negative.  Negative for dizziness, weakness, light-headedness and headaches.  Hematological:  Negative for adenopathy. Does not bruise/bleed easily.  Psychiatric/Behavioral: Negative.      Objective:  BP 136/88 (BP Location: Right Arm, Patient Position: Sitting, Cuff Size: Large)   Pulse (!) 109   Temp 98.2 F (36.8 C) (Oral)   Ht 5' 3"  (1.6 m)   Wt 167 lb (75.8 kg)   SpO2 99%   BMI 29.58 kg/m   BP Readings from Last 3 Encounters:  02/16/22 136/88  04/09/21 124/82  11/18/20 122/76    Wt Readings from Last 3 Encounters:  02/16/22 167 lb (75.8 kg)  04/09/21 157 lb 12.8 oz (71.6 kg)  11/18/20 155 lb (70.3 kg)    Physical Exam Vitals reviewed.  Constitutional:      Appearance: She is not ill-appearing.  HENT:     Nose: Nose normal.     Mouth/Throat:     Mouth: Mucous membranes are moist.  Eyes:     General: No scleral icterus.    Conjunctiva/sclera: Conjunctivae normal.  Cardiovascular:     Rate and Rhythm: Regular rhythm. Tachycardia present.     Heart sounds: Normal heart sounds, S1 normal and S2 normal. No murmur heard.    Comments: EKG- NSR, 100 bpm Normal EKG Pulmonary:     Effort: Pulmonary effort is normal.     Breath sounds: No stridor. No wheezing, rhonchi or rales.  Abdominal:     General: Abdomen  is protuberant. Bowel sounds are normal.     Palpations: There is mass. There is no hepatomegaly or splenomegaly.     Tenderness: There is no abdominal tenderness. There is no guarding or rebound.     Hernia: No hernia is present.  Musculoskeletal:     Cervical back: Neck supple.     Right lower leg: No edema.     Left lower leg: No edema.  Lymphadenopathy:     Cervical: No cervical adenopathy.  Skin:    General: Skin is warm and dry.     Coloration: Skin is pale.  Neurological:     General: No focal deficit present.     Mental Status: She is alert. Mental status is at baseline.  Psychiatric:         Mood and Affect: Mood normal.        Behavior: Behavior normal.     Lab Results  Component Value Date   WBC 4.2 02/16/2022   HGB 10.4 (L) 02/16/2022   HCT 32.6 (L) 02/16/2022   PLT 276.0 02/16/2022   GLUCOSE 90 02/16/2022   CHOL 206 (H) 02/16/2022   TRIG 89.0 02/16/2022   HDL 77.80 02/16/2022   LDLDIRECT 134.7 05/10/2013   LDLCALC 111 (H) 02/16/2022   ALT 10 02/16/2022   AST 15 02/16/2022   NA 137 02/16/2022   K 3.5 02/16/2022   CL 106 02/16/2022   CREATININE 0.85 02/16/2022   BUN 10 02/16/2022   CO2 24 02/16/2022   TSH 2.11 02/16/2022    MM 3D SCREEN BREAST BILATERAL  Result Date: 01/19/2021 CLINICAL DATA:  Screening. EXAM: DIGITAL SCREENING BILATERAL MAMMOGRAM WITH TOMOSYNTHESIS AND CAD TECHNIQUE: Bilateral screening digital craniocaudal and mediolateral oblique mammograms were obtained. Bilateral screening digital breast tomosynthesis was performed. The images were evaluated with computer-aided detection. COMPARISON:  Previous exam(s). ACR Breast Density Category c: The breast tissue is heterogeneously dense, which may obscure small masses. FINDINGS: There are no findings suspicious for malignancy. The images were evaluated with computer-aided detection. IMPRESSION: No mammographic evidence of malignancy. A result letter of this screening mammogram will be mailed directly to the patient. RECOMMENDATION: Screening mammogram in one year. (Code:SM-B-01Y) BI-RADS CATEGORY  1: Negative. Electronically Signed   By: Dorise Bullion III M.D   On: 01/19/2021 09:35    Assessment & Plan:   Felicia Johnson was seen today for annual exam and anemia.  Diagnoses and all orders for this visit:  Routine general medical examination at a health care facility- Exam completed, labs reviewed, vaccines are up-to-date, cancer screenings are up-to-date, patient education was given. -     Lipid panel; Future -     Lipid panel  Allergic rhinitis due to pollen, unspecified seasonality -      fluticasone (FLONASE) 50 MCG/ACT nasal spray; Place 2 sprays into both nostrils daily. -     levocetirizine (XYZAL) 5 MG tablet; Take 1 tablet by mouth every evening. -     montelukast (SINGULAIR) 10 MG tablet; TAKE 1 TABLET BY MOUTH AT BEDTIME  Gastroesophageal reflux disease with esophagitis -     pantoprazole (PROTONIX) 40 MG tablet; TAKE 1 TABLET BY MOUTH ONCE DAILY -     CBC with Differential/Platelet; Future -     Hepatic function panel; Future -     Hepatic function panel -     CBC with Differential/Platelet  Need for hepatitis C screening test -     Hepatitis C antibody; Future -     Hepatitis C antibody  Tinnitus aurium, bilateral -     Basic metabolic panel; Future -     Hepatic function panel; Future -     Ambulatory referral to ENT -     Hepatic function panel -     Basic metabolic panel  Tachycardia- Her EKG is reassuring.  This is likely related to the anemia. -     EKG 12-Lead -     Basic metabolic panel; Future -     CBC with Differential/Platelet; Future -     TSH; Future -     Hepatic function panel; Future -     Hepatic function panel -     TSH -     CBC with Differential/Platelet -     Basic metabolic panel  Deficiency anemia- She is deficient in iron and B12. -     IBC + Ferritin; Future -     Vitamin B12; Future -     CBC with Differential/Platelet; Future -     Vitamin B1; Future -     Hepatic function panel; Future -     Folate; Future -     Folate -     Hepatic function panel -     Vitamin B1 -     CBC with Differential/Platelet -     Vitamin B12 -     IBC + Ferritin  Uterine leiomyoma, unspecified location -     Ambulatory referral to Gynecology  Vitamin B12 deficiency anemia due to intrinsic factor deficiency -     cyanocobalamin 2000 MCG tablet; Take 1 tablet (2,000 mcg total) by mouth daily.  Iron deficiency anemia due to chronic blood loss -     Ferric Maltol (ACCRUFER) 30 MG CAPS; Take 1 capsule by mouth in the morning and at  bedtime.   I am having Felicia Johnson start on cyanocobalamin and ACCRUFeR. I am also having her maintain her Cholecalciferol, Carestart COVID-19 Home Test, fluticasone, levocetirizine, montelukast, and pantoprazole.  Meds ordered this encounter  Medications   fluticasone (FLONASE) 50 MCG/ACT nasal spray    Sig: Place 2 sprays into both nostrils daily.    Dispense:  48 g    Refill:  1   levocetirizine (XYZAL) 5 MG tablet    Sig: Take 1 tablet by mouth every evening.    Dispense:  90 tablet    Refill:  1   montelukast (SINGULAIR) 10 MG tablet    Sig: TAKE 1 TABLET BY MOUTH AT BEDTIME    Dispense:  90 tablet    Refill:  1   pantoprazole (PROTONIX) 40 MG tablet    Sig: TAKE 1 TABLET BY MOUTH ONCE DAILY    Dispense:  90 tablet    Refill:  0   cyanocobalamin 2000 MCG tablet    Sig: Take 1 tablet (2,000 mcg total) by mouth daily.    Dispense:  90 tablet    Refill:  1   Ferric Maltol (ACCRUFER) 30 MG CAPS    Sig: Take 1 capsule by mouth in the morning and at bedtime.    Dispense:  180 capsule    Refill:  1     Follow-up: Return in about 3 months (around 05/19/2022).  Scarlette Calico, MD

## 2022-02-18 ENCOUNTER — Other Ambulatory Visit (HOSPITAL_COMMUNITY): Payer: Self-pay

## 2022-02-18 ENCOUNTER — Other Ambulatory Visit: Payer: Self-pay | Admitting: Internal Medicine

## 2022-02-18 ENCOUNTER — Telehealth: Payer: Self-pay | Admitting: *Deleted

## 2022-02-18 ENCOUNTER — Other Ambulatory Visit: Payer: Self-pay | Admitting: Obstetrics and Gynecology

## 2022-02-18 DIAGNOSIS — D500 Iron deficiency anemia secondary to blood loss (chronic): Secondary | ICD-10-CM

## 2022-02-18 DIAGNOSIS — Z1231 Encounter for screening mammogram for malignant neoplasm of breast: Secondary | ICD-10-CM

## 2022-02-18 NOTE — Telephone Encounter (Signed)
Noted.. MD states she is referred for iron treatment.Marland KitchenJohny Johnson

## 2022-02-18 NOTE — Telephone Encounter (Signed)
Rec'd fax pt is needing PA on Accufer 30 mg. Submitted PA via cover-my-meds w/ Key: [id number]. Rec'd msg " This drug/product is not covered under the pharmacy benefit. Prior Authorization is not available." Please call in alternative.Marland KitchenJohny Chess

## 2022-02-20 ENCOUNTER — Other Ambulatory Visit: Payer: Self-pay | Admitting: Internal Medicine

## 2022-02-20 DIAGNOSIS — E519 Thiamine deficiency, unspecified: Secondary | ICD-10-CM

## 2022-02-20 LAB — VITAMIN B1: Vitamin B1 (Thiamine): 9 nmol/L (ref 8–30)

## 2022-02-20 LAB — HEPATITIS C ANTIBODY: Hepatitis C Ab: NONREACTIVE

## 2022-02-20 MED ORDER — VITAMIN B-1 50 MG PO TABS
50.0000 mg | ORAL_TABLET | Freq: Every day | ORAL | 1 refills | Status: AC
  Filled 2022-02-20 – 2022-08-05 (×2): qty 90, 90d supply, fill #0

## 2022-02-21 ENCOUNTER — Other Ambulatory Visit (HOSPITAL_COMMUNITY): Payer: Self-pay

## 2022-02-22 ENCOUNTER — Encounter: Payer: Self-pay | Admitting: Internal Medicine

## 2022-03-03 ENCOUNTER — Other Ambulatory Visit (HOSPITAL_COMMUNITY): Payer: Self-pay

## 2022-03-03 ENCOUNTER — Other Ambulatory Visit: Payer: Self-pay | Admitting: Internal Medicine

## 2022-03-03 DIAGNOSIS — D500 Iron deficiency anemia secondary to blood loss (chronic): Secondary | ICD-10-CM

## 2022-03-03 MED ORDER — FERROUS SULFATE 325 (65 FE) MG PO TABS
325.0000 mg | ORAL_TABLET | Freq: Two times a day (BID) | ORAL | 0 refills | Status: DC
  Filled 2022-03-03: qty 180, 90d supply, fill #0

## 2022-03-26 ENCOUNTER — Other Ambulatory Visit (HOSPITAL_COMMUNITY): Payer: Self-pay

## 2022-03-30 LAB — HM PAP SMEAR

## 2022-04-04 ENCOUNTER — Ambulatory Visit: Payer: No Typology Code available for payment source

## 2022-04-04 ENCOUNTER — Ambulatory Visit
Admission: RE | Admit: 2022-04-04 | Discharge: 2022-04-04 | Disposition: A | Discharge status: Home / Self Care | Payer: No Typology Code available for payment source | Source: Ambulatory Visit | Attending: Obstetrics and Gynecology | Admitting: Obstetrics and Gynecology

## 2022-04-04 DIAGNOSIS — Z1231 Encounter for screening mammogram for malignant neoplasm of breast: Secondary | ICD-10-CM

## 2022-04-06 ENCOUNTER — Other Ambulatory Visit (HOSPITAL_COMMUNITY): Payer: Self-pay

## 2022-04-06 MED ORDER — TRANEXAMIC ACID 650 MG PO TABS
ORAL_TABLET | ORAL | 11 refills | Status: DC
  Filled 2022-04-06: qty 30, 5d supply, fill #0
  Filled 2022-08-05: qty 30, 5d supply, fill #1
  Filled 2023-04-02: qty 30, 5d supply, fill #2

## 2022-04-22 ENCOUNTER — Ambulatory Visit (HOSPITAL_BASED_OUTPATIENT_CLINIC_OR_DEPARTMENT_OTHER): Payer: No Typology Code available for payment source | Admitting: Obstetrics & Gynecology

## 2022-05-16 ENCOUNTER — Other Ambulatory Visit (HOSPITAL_COMMUNITY): Payer: Self-pay

## 2022-06-14 ENCOUNTER — Other Ambulatory Visit: Payer: Self-pay | Admitting: Internal Medicine

## 2022-06-14 DIAGNOSIS — K2100 Gastro-esophageal reflux disease with esophagitis, without bleeding: Secondary | ICD-10-CM

## 2022-06-14 MED ORDER — PANTOPRAZOLE SODIUM 40 MG PO TBEC
40.0000 mg | DELAYED_RELEASE_TABLET | Freq: Every day | ORAL | 0 refills | Status: DC
  Filled 2022-06-14: qty 90, 90d supply, fill #0

## 2022-06-15 ENCOUNTER — Other Ambulatory Visit (HOSPITAL_COMMUNITY): Payer: Self-pay

## 2022-06-17 ENCOUNTER — Other Ambulatory Visit (HOSPITAL_COMMUNITY): Payer: Self-pay

## 2022-08-04 ENCOUNTER — Encounter (HOSPITAL_BASED_OUTPATIENT_CLINIC_OR_DEPARTMENT_OTHER): Payer: Self-pay | Admitting: Obstetrics & Gynecology

## 2022-08-04 ENCOUNTER — Ambulatory Visit (INDEPENDENT_AMBULATORY_CARE_PROVIDER_SITE_OTHER): Payer: No Typology Code available for payment source | Admitting: Obstetrics & Gynecology

## 2022-08-04 VITALS — BP 131/89 | HR 93 | Ht 64.0 in | Wt 172.6 lb

## 2022-08-04 DIAGNOSIS — D251 Intramural leiomyoma of uterus: Secondary | ICD-10-CM | POA: Diagnosis not present

## 2022-08-04 DIAGNOSIS — D500 Iron deficiency anemia secondary to blood loss (chronic): Secondary | ICD-10-CM

## 2022-08-04 NOTE — Patient Instructions (Signed)
Call [phone] to schedule an appointment at [facility].

## 2022-08-05 ENCOUNTER — Encounter (HOSPITAL_BASED_OUTPATIENT_CLINIC_OR_DEPARTMENT_OTHER): Payer: Self-pay | Admitting: Obstetrics & Gynecology

## 2022-08-05 ENCOUNTER — Other Ambulatory Visit (HOSPITAL_COMMUNITY): Payer: Self-pay

## 2022-08-05 LAB — CBC
Hematocrit: 41.7 % (ref 34.0–46.6)
Hemoglobin: 13.7 g/dL (ref 11.1–15.9)
MCH: 28.8 pg (ref 26.6–33.0)
MCHC: 32.9 g/dL (ref 31.5–35.7)
MCV: 88 fL (ref 79–97)
Platelets: 295 10*3/uL (ref 150–450)
RBC: 4.75 x10E6/uL (ref 3.77–5.28)
RDW: 14.9 % (ref 11.7–15.4)
WBC: 5.6 10*3/uL (ref 3.4–10.8)

## 2022-08-05 LAB — IRON,TIBC AND FERRITIN PANEL
Ferritin: 42 ng/mL (ref 15–150)
Iron Saturation: 74 % — ABNORMAL HIGH (ref 15–55)
Iron: 242 ug/dL — ABNORMAL HIGH (ref 27–159)
Total Iron Binding Capacity: 328 ug/dL (ref 250–450)
UIBC: 86 ug/dL — ABNORMAL LOW (ref 131–425)

## 2022-08-07 NOTE — Progress Notes (Signed)
GYNECOLOGY  VISIT  CC:   history of fibroids, second opinion, referral from Dr. Scarlette Calico  HPI: 46 y.o.  Married Felicia Johnson female here for second opinion regarding fibroids.  She does have ob/gyn, Dr. Rogue Bussing, that she has seen in the past.  Knew she had fibroids.  Has really not wanted treatment as feels she is asymptomatic from them.  However, does have hx of anemia and iron deficiency and does have painful intercourse as well.  Reviewed previous findings with pt of fibroids with at least 10cm fibroid noted on CT done 12/02/2020.  Reviewed results independently.  Gave context for this size in relation to normal findings.  She didn't realize this fibroid was so large.  Bleeding is typically regular but it is heavy for a few days each month.  She does pass clots as well.  Has some cramping.  Dealing with these symptoms.  We did discuss treatment options, not just hysterectomy.  Feel pt should consider uterine artery embolization given size of uterus and no desires for future child bearing.  Information provided.  She wants to do some personal research.  Pt is sure she's had a pap smear in the last year or two.  Denies abnormal hx.   Past Medical History:  Diagnosis Date   Acid reflux    Anemia    Hyperlipidemia     MEDS:   Current Outpatient Medications on File Prior to Visit  Medication Sig Dispense Refill   Cholecalciferol 50 MCG (2000 UT) TABS Take 1 tablet (2,000 Units total) by mouth daily. 90 tablet 1   cyanocobalamin 2000 MCG tablet Take 1 tablet (2,000 mcg total) by mouth daily. 90 tablet 1   ferrous sulfate 325 (65 FE) MG tablet Take 1 tablet (325 mg total) by mouth 2 (two) times daily with a meal. 180 tablet 0   fluticasone (FLONASE) 50 MCG/ACT nasal spray Place 2 sprays into both nostrils daily. 48 g 1   levocetirizine (XYZAL) 5 MG tablet Take 1 tablet by mouth every evening. 90 tablet 1   montelukast (SINGULAIR) 10 MG tablet Take 1 tablet (10 mg total) by  mouth at bedtime. 90 tablet 1   pantoprazole (PROTONIX) 40 MG tablet Take 1 tablet (40 mg total) by mouth daily. 90 tablet 0   thiamine (VITAMIN B-1) 50 MG tablet Take 1 tablet by mouth daily. 90 tablet 1   tranexamic acid (LYSTEDA) 650 MG TABS tablet Take 2 tablets by mouth 3 times a day for 5 days. 30 tablet 11   No current facility-administered medications on file prior to visit.    ALLERGIES: Patient has no known allergies.  SH:  married, non smoker  Review of Systems  Genitourinary:        Menorrhagia    PHYSICAL EXAMINATION:    BP 131/89 (BP Location: Right Arm, Patient Position: Sitting, Cuff Size: Large)   Pulse 93   Ht '5\' 4"'$  (1.626 m) Comment: Reported  Wt 172 lb 9.6 oz (78.3 kg)   LMP 07/13/2022   BMI 29.63 kg/m     General appearance: alert, cooperative and appears stated age Abdomen: soft, non-tender; bowel sounds normal; no masses, grossly enlarge uterus noted Lymph:  no inguinal LAD noted  Pelvic: deferred  Assessment/Plan: 1. Intramural leiomyoma of uterus - will plan to repeat imaging.  Pt does not want another CT scan and I feel ultrasound should be adequate.  Suspect fibroid larger than on CT given size on exam today - treatment  options reviewed with pt and spouse.  They will consider.  She may ultimately want to follow up with Dr. Rogue Bussing for treatment. - US PELVIC COMPLETE WITH TRANSVAGINAL; Future  2. Iron deficiency anemia due to chronic blood loss - Iron, TIBC and Ferritin Panel - CBC

## 2022-08-09 ENCOUNTER — Other Ambulatory Visit (HOSPITAL_COMMUNITY): Payer: Self-pay

## 2022-08-10 ENCOUNTER — Other Ambulatory Visit: Payer: Self-pay | Admitting: Internal Medicine

## 2022-08-10 DIAGNOSIS — K2100 Gastro-esophageal reflux disease with esophagitis, without bleeding: Secondary | ICD-10-CM

## 2022-08-10 MED ORDER — PANTOPRAZOLE SODIUM 40 MG PO TBEC
40.0000 mg | DELAYED_RELEASE_TABLET | Freq: Every day | ORAL | 0 refills | Status: DC
  Filled 2022-08-10 – 2022-09-07 (×2): qty 90, 90d supply, fill #0

## 2022-08-11 ENCOUNTER — Other Ambulatory Visit (HOSPITAL_COMMUNITY): Payer: Self-pay

## 2022-08-11 ENCOUNTER — Other Ambulatory Visit: Payer: Self-pay | Admitting: Internal Medicine

## 2022-08-11 DIAGNOSIS — J301 Allergic rhinitis due to pollen: Secondary | ICD-10-CM

## 2022-08-12 ENCOUNTER — Other Ambulatory Visit: Payer: Self-pay | Admitting: Internal Medicine

## 2022-08-12 ENCOUNTER — Other Ambulatory Visit (HOSPITAL_COMMUNITY): Payer: Self-pay

## 2022-08-12 DIAGNOSIS — J301 Allergic rhinitis due to pollen: Secondary | ICD-10-CM

## 2022-08-12 MED ORDER — FLUTICASONE PROPIONATE 50 MCG/ACT NA SUSP
2.0000 | Freq: Every day | NASAL | 1 refills | Status: DC
  Filled 2022-08-12: qty 48, 90d supply, fill #0
  Filled 2022-11-08: qty 48, 90d supply, fill #1

## 2022-08-12 MED ORDER — LEVOCETIRIZINE DIHYDROCHLORIDE 5 MG PO TABS
ORAL_TABLET | Freq: Every evening | ORAL | 1 refills | Status: DC
  Filled 2022-08-12: qty 90, 90d supply, fill #0
  Filled 2022-11-08: qty 90, 90d supply, fill #1

## 2022-08-13 ENCOUNTER — Other Ambulatory Visit (HOSPITAL_COMMUNITY): Payer: Self-pay

## 2022-08-19 ENCOUNTER — Ambulatory Visit (HOSPITAL_BASED_OUTPATIENT_CLINIC_OR_DEPARTMENT_OTHER)
Admission: RE | Admit: 2022-08-19 | Discharge: 2022-08-19 | Disposition: A | Discharge status: Home / Self Care | Payer: 59 | Source: Ambulatory Visit | Attending: Obstetrics & Gynecology | Admitting: Obstetrics & Gynecology

## 2022-08-19 DIAGNOSIS — D251 Intramural leiomyoma of uterus: Secondary | ICD-10-CM | POA: Insufficient documentation

## 2022-08-19 DIAGNOSIS — D259 Leiomyoma of uterus, unspecified: Secondary | ICD-10-CM | POA: Diagnosis not present

## 2022-09-05 ENCOUNTER — Ambulatory Visit: Payer: No Typology Code available for payment source | Admitting: Internal Medicine

## 2022-09-08 ENCOUNTER — Other Ambulatory Visit (HOSPITAL_COMMUNITY): Payer: Self-pay

## 2022-09-08 ENCOUNTER — Other Ambulatory Visit (HOSPITAL_BASED_OUTPATIENT_CLINIC_OR_DEPARTMENT_OTHER): Payer: Self-pay | Admitting: Obstetrics & Gynecology

## 2022-09-08 DIAGNOSIS — D251 Intramural leiomyoma of uterus: Secondary | ICD-10-CM

## 2022-09-10 ENCOUNTER — Other Ambulatory Visit (HOSPITAL_COMMUNITY): Payer: Self-pay

## 2022-09-26 ENCOUNTER — Encounter: Payer: Self-pay | Admitting: *Deleted

## 2022-09-30 ENCOUNTER — Other Ambulatory Visit: Payer: Self-pay | Admitting: Obstetrics & Gynecology

## 2022-09-30 DIAGNOSIS — D259 Leiomyoma of uterus, unspecified: Secondary | ICD-10-CM

## 2022-10-21 ENCOUNTER — Ambulatory Visit
Admission: RE | Admit: 2022-10-21 | Discharge: 2022-10-21 | Disposition: A | Discharge status: Home / Self Care | Payer: 59 | Source: Ambulatory Visit | Attending: Obstetrics & Gynecology | Admitting: Obstetrics & Gynecology

## 2022-10-21 DIAGNOSIS — D259 Leiomyoma of uterus, unspecified: Secondary | ICD-10-CM | POA: Diagnosis not present

## 2022-10-21 NOTE — Consult Note (Signed)
Chief Complaint: Patient was seen in consultation today for uterine fibroids at the request of Felicia Johnson  Referring Physician(Johnson): Felicia Johnson  History of Present Illness: Felicia Johnson is a 47 y.o. G3, P3 African-American female with a history of uterine fibroids and associated prior iron deficiency anemia.  Significant fibroid disease was uncovered initially by CT on 12/02/2020 demonstrating significant uterine enlargement and a dominant intramural fundal fibroid measuring up to approximately 10.6 cm in maximum diameter and a second left-sided fibroid measuring up to 2.6 cm.  She was treated for iron deficiency anemia in 2022 and 2023 with hemoglobin as low as 10.4 and abnormal iron studies.  After iron replacement, hemoglobin has come up to a normal level of 13.7 and iron studies have improved.  Lesser cycles are monthly and regular and last approximately 7 days with 3 to 4 days of heavy bleeding.  Last menstrual cycle was 09/30/2022.  She does not have any bleeding or spotting between periods.  She can palpate an enlarged uterus and it causes abdominal protrusion but does not have significant bulk symptoms.  She awakens approximately once a night, but not every night, to urinate.  She has no future pregnancy plans and is married.  Aunt and grandmother went through early onset menopause.  She thinks that her mother went through menopause in her 2s.  Past Medical History:  Diagnosis Date   Acid reflux    Anemia    Hyperlipidemia     Past Surgical History:  Procedure Laterality Date   TUBAL LIGATION      Allergies: Patient has no known allergies.  Medications: Prior to Admission medications   Medication Sig Start Date End Date Taking? Authorizing Provider  Cholecalciferol 50 MCG (2000 UT) TABS Take 1 tablet (2,000 Units total) by mouth daily. 12/26/19   Felicia Lima, MD  cyanocobalamin 2000 MCG tablet Take 1 tablet (2,000 mcg total) by mouth daily. 02/16/22   Felicia Lima, MD  ferrous sulfate 325 (65 FE) MG tablet Take 1 tablet (325 mg total) by mouth 2 (two) times daily with a meal. 03/03/22   Felicia Lima, MD  fluticasone (FLONASE) 50 MCG/ACT nasal spray Place 2 sprays into both nostrils daily. 08/12/22 08/12/23  Felicia Lima, MD  levocetirizine (XYZAL) 5 MG tablet Take 1 tablet by mouth every evening. 08/12/22 08/12/23  Felicia Lima, MD  montelukast (SINGULAIR) 10 MG tablet Take 1 tablet (10 mg total) by mouth at bedtime. 02/16/22   Felicia Lima, MD  pantoprazole (PROTONIX) 40 MG tablet Take 1 tablet (40 mg total) by mouth daily. 08/10/22   Felicia Lima, MD  thiamine (VITAMIN B-1) 50 MG tablet Take 1 tablet by mouth daily. 02/20/22   Felicia Lima, MD  tranexamic acid (LYSTEDA) 650 MG TABS tablet Take 2 tablets by mouth 3 times a day for 5 days. 04/06/22        Family History  Problem Relation Age of Onset   Hypertension Mother    Stroke Other    Cancer Neg Hx    Heart disease Neg Hx    Hyperlipidemia Neg Hx    Diabetes Neg Hx    Drug abuse Neg Hx    Alcohol abuse Neg Hx    Kidney disease Neg Hx     Social History   Socioeconomic History   Marital status: Married    Spouse name: Not on file   Number of children: Not on file  Years of education: Not on file   Highest education level: Not on file  Occupational History   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[employer]  Tobacco Use   Smoking status: Never   Smokeless tobacco: Never  Vaping Use   Vaping Use: Never used  Substance and Sexual Activity   Alcohol use: No   Drug use: No   Sexual activity: Yes    Birth control/protection: Surgical  Other Topics Concern   Not on file  Social History Narrative   Regular Exercise -  NO         Social Determinants of Health   Financial Resource Strain: Not on file  Food Insecurity: Not on file  Transportation Needs: Not on file  Physical Activity: Not on file  Stress: Not on file  Social Connections: Not on file    Review of Systems: A 12  point ROS discussed and pertinent positives are indicated in the HPI above.  All other systems are negative.  Review of Systems  Constitutional: Negative.   Respiratory: Negative.    Cardiovascular: Negative.   Gastrointestinal:  Positive for abdominal distention. Negative for abdominal pain, constipation, diarrhea, nausea and vomiting.  Genitourinary: Negative.   Musculoskeletal: Negative.   Neurological: Negative.     Vital Signs: BP 126/84 (BP Location: Left Arm, Patient Position: Sitting, Cuff Size: Normal)   Pulse 95   Temp 98.4 F (36.9 C) (Oral)   Wt 77.1 kg   SpO2 97% Comment: room air  BMI 29.18 kg/m     Physical Exam Vitals reviewed.  Constitutional:      General: She is not in acute distress.    Appearance: Normal appearance. She is not ill-appearing, toxic-appearing or diaphoretic.  Cardiovascular:     Rate and Rhythm: Normal rate and regular rhythm.     Heart sounds: Normal heart sounds. No murmur heard.    No gallop.  Pulmonary:     Effort: No respiratory distress.     Breath sounds: Normal breath sounds. No stridor. No wheezing, rhonchi or rales.  Abdominal:     General: Bowel sounds are normal.     Tenderness: There is no abdominal tenderness. There is no guarding or rebound.     Comments: Palpable uterus extending to just below the umbilicus.  Protrusion is slightly greater just to the right of midline.  The uterus is nontender to palpation.  Musculoskeletal:        General: No swelling.  Skin:    General: Skin is warm and dry.  Neurological:     General: No focal deficit present.     Mental Status: She is alert and oriented to person, place, and time.      Imaging: No results found.  Labs:  CBC: Recent Labs    02/16/22 0913 08/04/22 1457  WBC 4.2 5.6  HGB 10.4* 13.7  HCT 32.6* 41.7  PLT 276.0 295    COAGS: No results for input(Johnson): "INR", "APTT" in the last 8760 hours.  BMP: Recent Labs    02/16/22 0913  NA 137  K 3.5  CL  106  CO2 24  GLUCOSE 90  BUN 10  CALCIUM 8.7  CREATININE 0.85    LIVER FUNCTION TESTS: Recent Labs    02/16/22 0913  BILITOT 0.3  AST 15  ALT 10  ALKPHOS 36*  PROT 7.4  ALBUMIN 4.1     Assessment and Plan:  I met with Ms. Felicia Johnson.  We reviewed prior imaging.  By my measurements, the uterus  by CT on 12/02/2020 measures approximately 16.5 x 9.8 x 13.3 cm for estimated volume of 1,118 mL. The dominant superior intramural fundal fibroid may have a submucosal component and measures approximately 10.5 x 8.0 x 10.3 cm by CT.  Smaller roughly 3 cm lower left-sided intramural fibroid present as well as potentially a smaller 2.4 cm posterior lower subserosal fibroid.  More recent ultrasound on 08/19/2022 demonstrates similar fibroid dimensions with the eminent fibroid measuring approximately 10.7 x 9.4 x 8.7 cm by transabdominal ultrasound.  Ms. Yancy certainly has significant uterine enlargement predominantly due to a single dominant fibroid.  Her menorrhagia is not severe and anemia now adequately treated with iron replacement.  Her bulk symptoms are also not currently severe.  We discussed future indications for uterine fibroid embolization including worsening menorrhagia or more significant bulk symptoms related to further uterine enlargement.  Her uterine dimensions and fibroid dimensions appear quite stable between the 2022 CT and ultrasound earlier this year in January.  She still has several years before menopause and fibroid tissue could certainly further enlarge before that time.  I did tell her that she would need a pelvic MRI to determine exact fibroid morphology and enhancement pattern prior to possible embolization.  She would like to wait to perform pelvic MRI until she is more symptomatic and considering having a procedure performed.  She will notify me if she is getting more symptomatic and would like to perform a pelvic MRI.  Thank you for this interesting consult.  I greatly enjoyed  meeting Felicia Johnson and look forward to participating in their care.  A copy of this report was sent to the requesting provider on this date.  Electronically Signed: Azzie Roup 10/21/2022, 11:06 AM    I spent a total of  40 Minutes  in face to face in clinical consultation, greater than 50% of which was counseling/coordinating care for uterine fibroids.

## 2022-10-30 IMAGING — MG MM DIGITAL SCREENING BILAT W/ TOMO AND CAD
8 series · 9 of 24 positions shown · non-contrast
Comparison: Previous exam(s).

CLINICAL DATA: Screening.

EXAM:
DIGITAL SCREENING BILATERAL MAMMOGRAM WITH TOMOSYNTHESIS AND CAD
TECHNIQUE: Bilateral screening digital craniocaudal and mediolateral oblique
mammograms were obtained. Bilateral screening digital breast
tomosynthesis was performed. The images were evaluated with
computer-aided detection.

[R CC synth-2D]
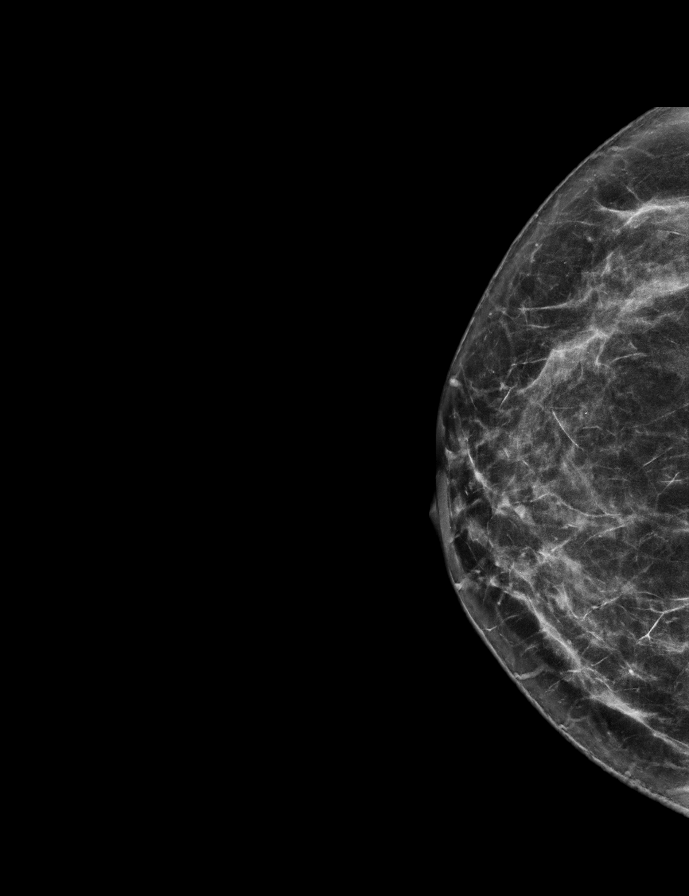

[L MLO synth-2D]
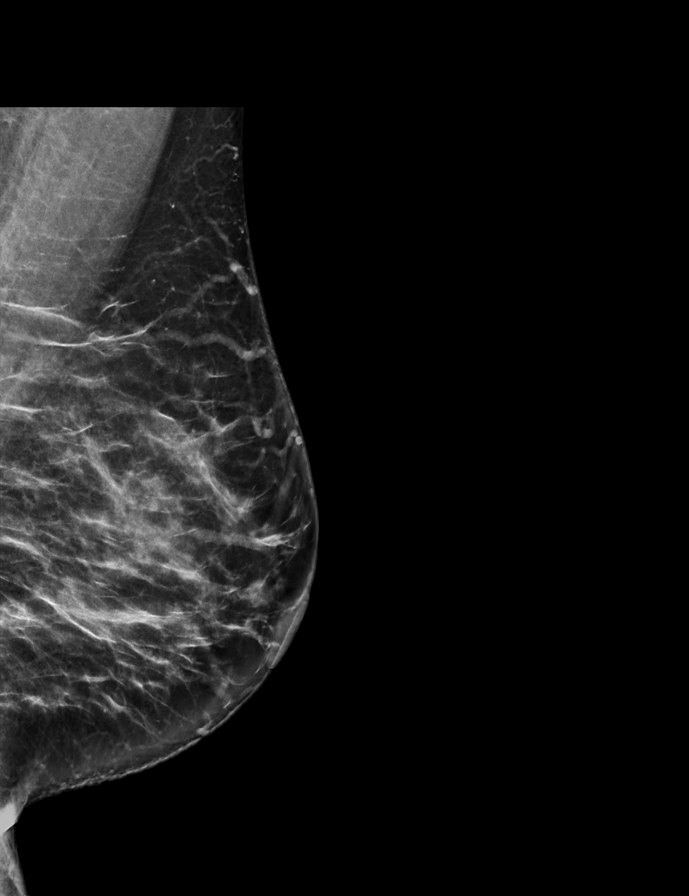

[L CC synth-2D]
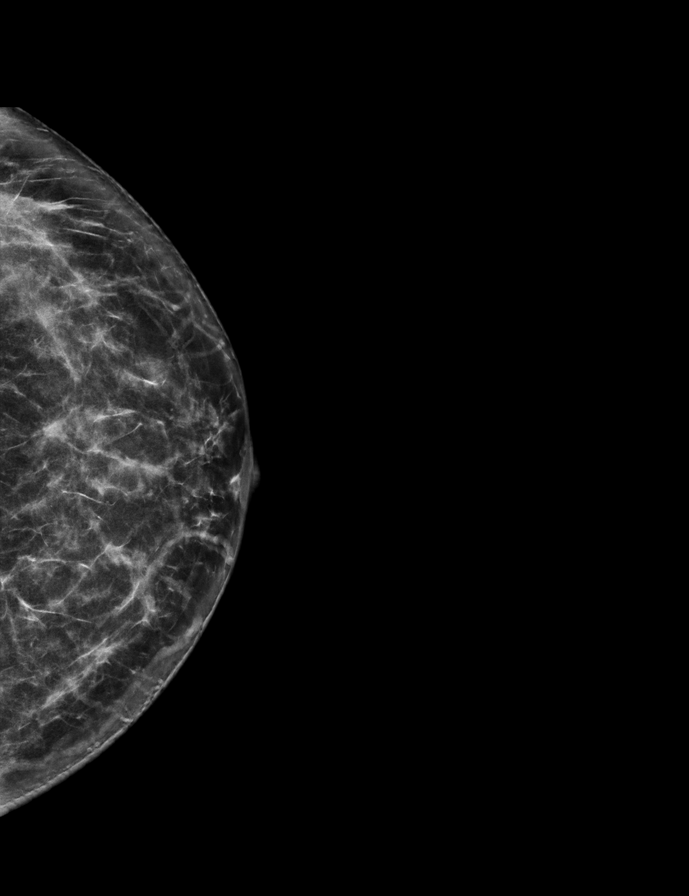

[R MLO synth-2D]
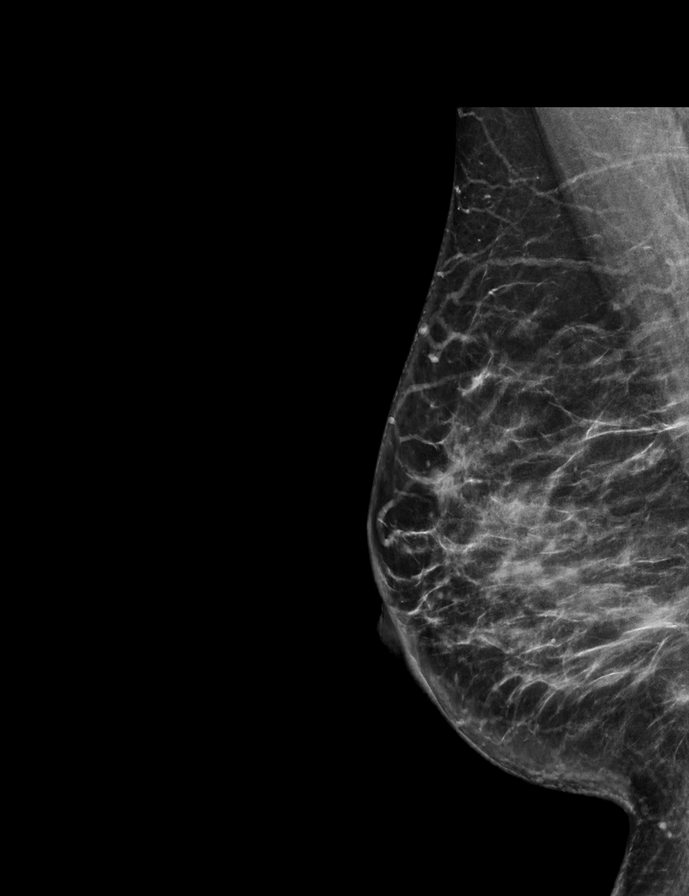

[L CC tomo · 2 of 62 frames shown]
[frame 21/62]
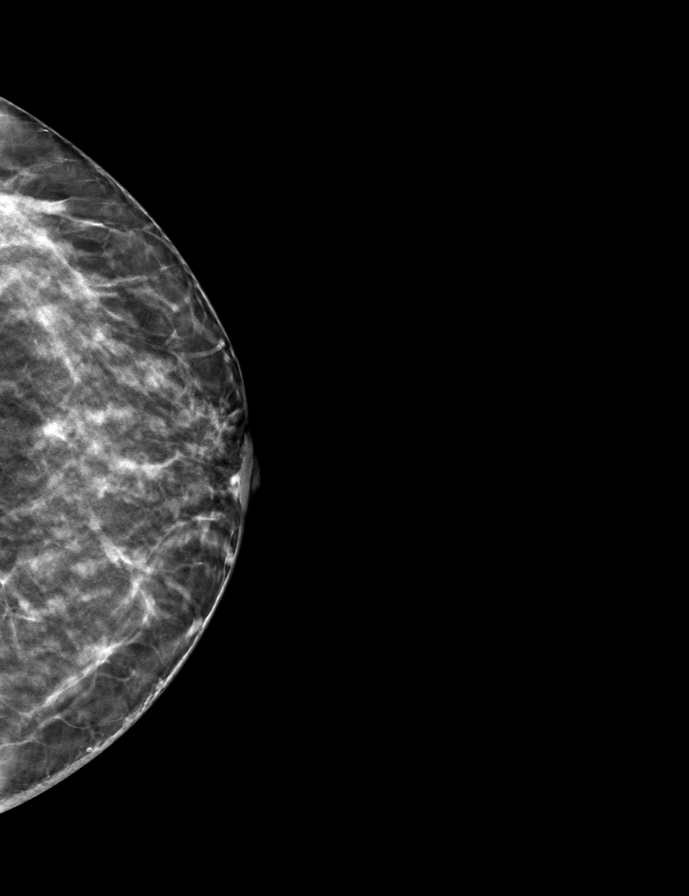
[frame 31/62]
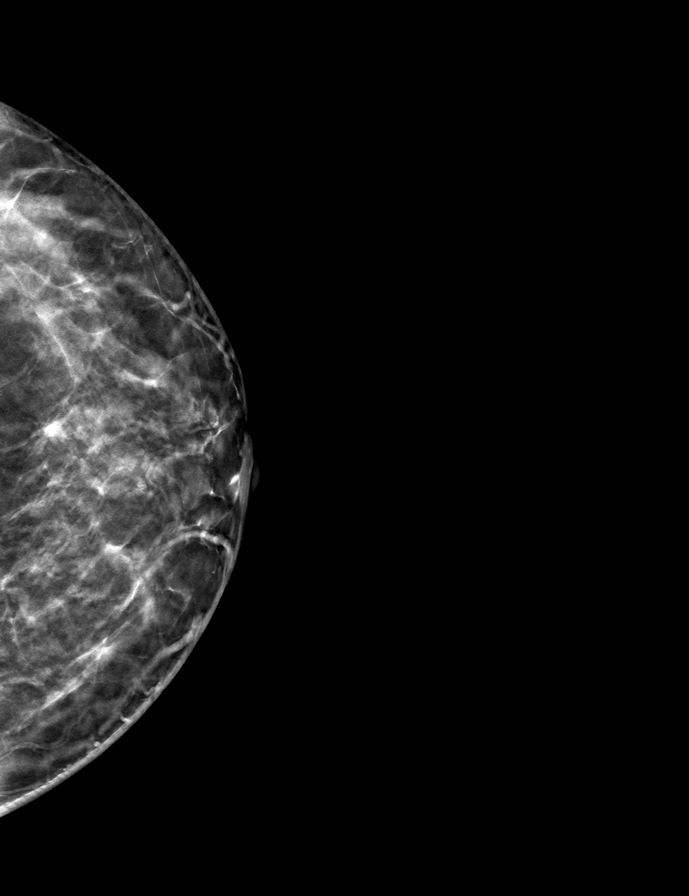

[R CC tomo · tomo slice 33/64.0]
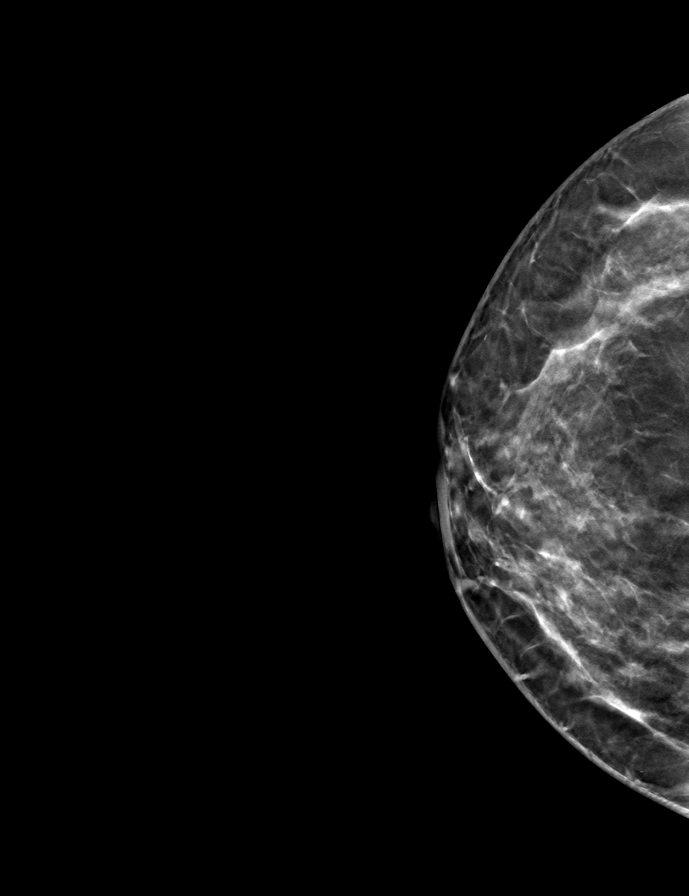

[R MLO tomo · tomo slice 37/73.0]
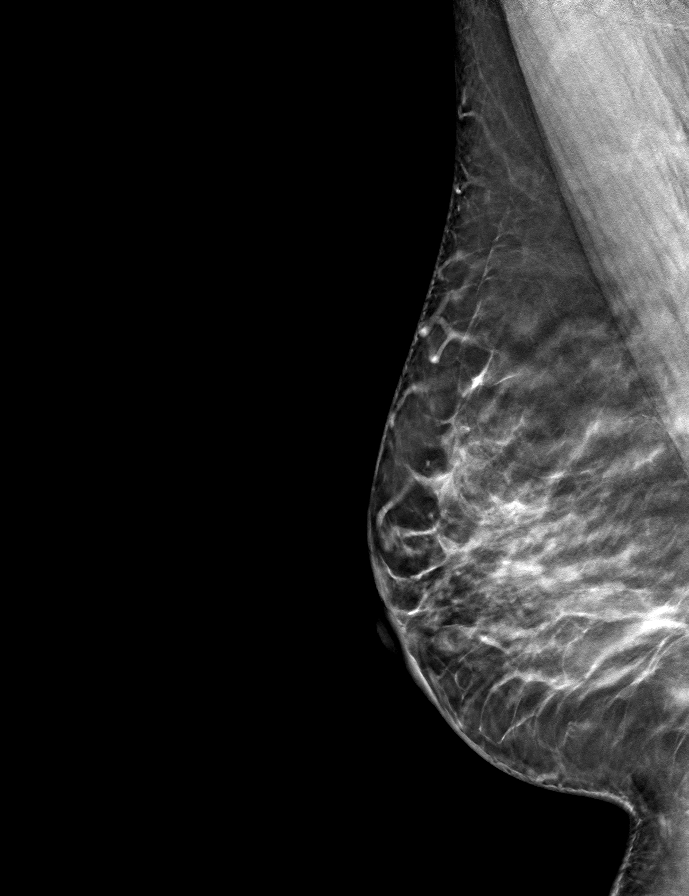

[L MLO tomo · tomo slice 37/74.0]
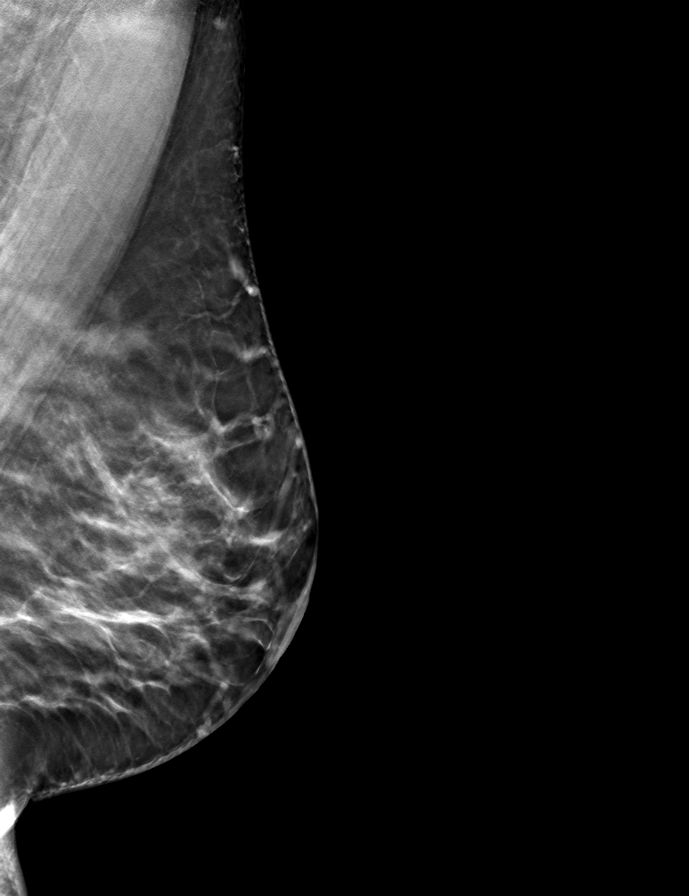

[9 of 24 positions shown; findings below may reference images not displayed]

ACR Breast Density Category c: The breast tissue is heterogeneously
dense, which may obscure small masses.
FINDINGS: There are no findings suspicious for malignancy. The images were
evaluated with computer-aided detection.
IMPRESSION: No mammographic evidence of malignancy. A result letter of this
screening mammogram will be mailed directly to the patient.

RECOMMENDATION:
Screening mammogram in one year. (Code:T4-5-GWO)

BI-RADS CATEGORY  1: Negative.

## 2022-11-08 ENCOUNTER — Other Ambulatory Visit: Payer: Self-pay | Admitting: Internal Medicine

## 2022-11-08 DIAGNOSIS — J301 Allergic rhinitis due to pollen: Secondary | ICD-10-CM

## 2022-11-09 ENCOUNTER — Other Ambulatory Visit: Payer: Self-pay

## 2022-11-09 ENCOUNTER — Other Ambulatory Visit (HOSPITAL_COMMUNITY): Payer: Self-pay

## 2022-11-09 MED ORDER — MONTELUKAST SODIUM 10 MG PO TABS
10.0000 mg | ORAL_TABLET | Freq: Every day | ORAL | 0 refills | Status: DC
  Filled 2022-11-09: qty 90, 90d supply, fill #0

## 2022-11-26 ENCOUNTER — Other Ambulatory Visit (HOSPITAL_COMMUNITY): Payer: Self-pay

## 2022-11-26 ENCOUNTER — Other Ambulatory Visit: Payer: Self-pay | Admitting: Internal Medicine

## 2022-11-26 DIAGNOSIS — K2100 Gastro-esophageal reflux disease with esophagitis, without bleeding: Secondary | ICD-10-CM

## 2022-11-26 MED ORDER — PANTOPRAZOLE SODIUM 40 MG PO TBEC
40.0000 mg | DELAYED_RELEASE_TABLET | Freq: Every day | ORAL | 0 refills | Status: DC
  Filled 2022-11-26: qty 90, 90d supply, fill #0

## 2022-12-09 ENCOUNTER — Other Ambulatory Visit: Payer: Self-pay | Admitting: Obstetrics and Gynecology

## 2022-12-09 DIAGNOSIS — Z0000 Encounter for general adult medical examination without abnormal findings: Secondary | ICD-10-CM

## 2023-01-03 ENCOUNTER — Telehealth: Payer: Self-pay

## 2023-01-03 ENCOUNTER — Encounter: Payer: Self-pay | Admitting: Internal Medicine

## 2023-01-03 ENCOUNTER — Other Ambulatory Visit: Payer: Self-pay | Admitting: Internal Medicine

## 2023-01-03 DIAGNOSIS — G4733 Obstructive sleep apnea (adult) (pediatric): Secondary | ICD-10-CM

## 2023-01-03 NOTE — Telephone Encounter (Signed)
Pt is asking that a referral be sent in for Neurology for Sleep Apnea to [facility] to Dr. Huston Foley

## 2023-01-04 ENCOUNTER — Encounter: Payer: Self-pay | Admitting: Neurology

## 2023-01-04 ENCOUNTER — Ambulatory Visit (INDEPENDENT_AMBULATORY_CARE_PROVIDER_SITE_OTHER): Payer: 59 | Admitting: Neurology

## 2023-01-04 VITALS — BP 114/75 | HR 94 | Ht 64.0 in | Wt 174.2 lb

## 2023-01-04 DIAGNOSIS — R351 Nocturia: Secondary | ICD-10-CM | POA: Diagnosis not present

## 2023-01-04 DIAGNOSIS — D500 Iron deficiency anemia secondary to blood loss (chronic): Secondary | ICD-10-CM

## 2023-01-04 DIAGNOSIS — E663 Overweight: Secondary | ICD-10-CM | POA: Diagnosis not present

## 2023-01-04 DIAGNOSIS — G4733 Obstructive sleep apnea (adult) (pediatric): Secondary | ICD-10-CM

## 2023-01-04 DIAGNOSIS — G4719 Other hypersomnia: Secondary | ICD-10-CM | POA: Diagnosis not present

## 2023-01-04 NOTE — Patient Instructions (Signed)
We will start you on home AutoPap therapy as a trial via nasal pillows or nasal cushion interface.

## 2023-01-04 NOTE — Progress Notes (Signed)
Subjective:    Patient ID: Felicia Johnson is a 47 y.o. female.  HPI    Huston Foley, MD, PhD [facility] [address] [address] [place], [place] [address]  I saw Felicia Johnson, for evaluation of her sleep disturbance.  The patient is referred by her primary care physician.  Felicia Johnson is a 47 year old female with an underlying medical history of reflux disease, anemia, hyperlipidemia and overweight state, who reports significant snoring.  Her snoring has become worse over time.  She does endorse some sleep disruption particularly in the past few months secondary to hot flashes.  She has had worsening snoring over time and some daytime somnolence.  Her Epworth sleepiness score is 11 out of 24, fatigue severity score is 9 out of 63.  She works full-time as a Clinical biochemist.  She goes to bed between 10 and 10:30 PM and rise time is around 6 AM.  She has nocturia usually once per average night, denies nocturnal or recurrent morning headaches.  She is not aware of any family history of sleep apnea but knows little about her father side of the family.  She is a non-smoker, she does not drink caffeine on a regular basis, rare alcohol.  She lives with her family including husband and 3 children.  Her weight has been fluctuating.  She usually tries to sleep on her sides.  Her snoring is louder when she sleeps on her back.  She is working on weight loss.  She did a home sleep test through our sleep lab which tested her positive for sleep apnea in the severe range with an AHI of 33.7/h, average oxygen saturation 94%, nadir 75% with time below or at 88% saturation of 1.5 minutes for the night.  Total testing time was 8 hours and 54 minutes, total sleep time estimated at 6 hours and 44 minutes.  She did not have any significant central respiratory events.  Intermittent mild to moderate snoring, rarely in the louder range, snoring was more pronounced in the supine position.    Her Past Medical  History Is Significant For: Past Medical History:  Diagnosis Date   Acid reflux    Anemia    Hyperlipidemia     Her Past Surgical History Is Significant For: Past Surgical History:  Procedure Laterality Date   TUBAL LIGATION      Her Family History Is Significant For: Family History  Problem Relation Age of Onset   Hypertension Mother    Hypertension Sister    Anemia Sister    Atrial fibrillation Maternal Grandfather    Alzheimer's disease Paternal Grandmother    Stroke Other    Cancer Neg Hx    Heart disease Neg Hx    Hyperlipidemia Neg Hx    Diabetes Neg Hx    Drug abuse Neg Hx    Alcohol abuse Neg Hx    Kidney disease Neg Hx     Her Social History Is Significant For: Social History   Socioeconomic History   Marital status: Married    Spouse name: Not on file   Number of children: Not on file   Years of education: Not on file   Highest education level: Not on file  Occupational History   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[employer]  Tobacco Use   Smoking status: Never   Smokeless tobacco: Never  Vaping Use   Vaping Use: Never used  Substance and Sexual Activity   Alcohol use: Yes  Comment: ocassional   Drug use: Never   Sexual activity: Yes    Birth control/protection: Surgical  Other Topics Concern   Not on file  Social History Narrative   Regular Exercise -  NO   Caffiene; gingerale   Lives home with children (3) and husband   Social Determinants of Health   Financial Resource Strain: Not on file  Food Insecurity: Not on file  Transportation Needs: Not on file  Physical Activity: Not on file  Stress: Not on file  Social Connections: Not on file    Her Allergies Are:  Allergies  Allergen Reactions   Latex Itching  :   Her Current Medications Are:  Outpatient Encounter Medications as of 01/04/2023  Medication Sig   Cholecalciferol 50 MCG (2000 UT) TABS Take 1 tablet (2,000 Units total) by mouth daily.   cyanocobalamin 2000 MCG tablet Take 1 tablet  (2,000 mcg total) by mouth daily.   ferrous sulfate 325 (65 FE) MG tablet Take 1 tablet (325 mg total) by mouth 2 (two) times daily with a meal.   fluticasone (FLONASE) 50 MCG/ACT nasal spray Place 2 sprays into both nostrils daily.   levocetirizine (XYZAL) 5 MG tablet Take 1 tablet by mouth every evening.   montelukast (SINGULAIR) 10 MG tablet Take 1 tablet (10 mg total) by mouth at bedtime.   pantoprazole (PROTONIX) 40 MG tablet Take 1 tablet (40 mg total) by mouth daily.   thiamine (VITAMIN B-1) 50 MG tablet Take 1 tablet by mouth daily.   tranexamic acid (LYSTEDA) 650 MG TABS tablet Take 2 tablets by mouth 3 times a day for 5 days.   No facility-administered encounter medications on file as of 01/04/2023.  :   Review of Systems:  Out of a complete 14 point review of systems, all are reviewed and negative with the exception of these symptoms as listed below:   Review of Systems  Neurological:        Snoring.  ESS 11, FSS 9.     Objective:  Neurological Exam  Physical Exam Physical Examination:   Vitals:   01/04/23 1242  BP: 114/75  Pulse: 94    General Examination: The patient is a very pleasant 47 y.o. female in no acute distress. She appears well-developed and well-nourished and well groomed.   HEENT: Normocephalic, atraumatic, pupils are equal, round and reactive to light, extraocular tracking is good without limitation to gaze excursion or nystagmus noted. Hearing is grossly intact. Face is symmetric with normal facial animation. Speech is clear with no dysarthria noted. There is no hypophonia. There is no lip, neck/head, jaw or voice tremor. Neck is supple with full range of passive and active motion. There are no carotid bruits on auscultation. Oropharynx exam reveals: No significant mouth dryness, good dental hygiene, moderate airway crowding secondary to small airway entry, tonsillar size of about 1-2+, Mallampati class III.  Neck circumference 14 and three-quarter  inches, moderate overbite noted.  Tongue protrudes centrally and palate elevates symmetrically.   Chest: Clear to auscultation without wheezing, rhonchi or crackles noted.  Heart: S1+S2+0, regular and normal without murmurs, rubs or gallops noted.   Abdomen: Soft, non-tender and non-distended.  Extremities: There is no pitting edema in the distal lower extremities bilaterally.   Skin: Warm and dry without trophic changes noted.   Musculoskeletal: exam reveals no obvious joint deformities.   Neurologically:  Mental status: The patient is awake, alert and oriented in all 4 spheres. Her immediate and remote memory, attention,  language skills and fund of knowledge are appropriate. There is no evidence of aphasia, agnosia, apraxia or anomia. Speech is clear with normal prosody and enunciation. Thought process is linear. Mood is normal and affect is normal.  Cranial nerves II - XII are as described above under HEENT exam.  Motor exam: Normal bulk, strength and tone is noted. There is no obvious action or resting tremor.  Fine motor skills and coordination: grossly intact.  Cerebellar testing: No dysmetria or intention tremor. There is no truncal or gait ataxia.  Sensory exam: intact to light touch in the upper and lower extremities.  Gait, station and balance: She stands easily. No veering to one side is noted. No leaning to one side is noted. Posture is age-appropriate and stance is narrow based. Gait shows normal stride length and normal pace. No problems turning are noted.   Assessment and Plan:  In summary, Felicia Johnson is a very pleasant 47 y.o.-year old female with an underlying medical history of reflux disease, anemia, hyperlipidemia and overweight state, who presents for evaluation of her sleep disturbance.  Recent home sleep testing indicates sleep disordered breathing in the severe range with an AHI of 33.7/h, O2 nadir 75% with intermittent mild to moderate snoring noted.  for most of  the night.  We talked about different treatment options for obstructive sleep apnea.  She was made aware of the risks and ramifications of untreated moderate to severe OSA, especially with respect to developing cardiovascular disease down the road, including congestive heart failure (CHF), difficult to treat hypertension, cardiac arrhythmias (particularly A-fib), neurovascular complications including TIA, stroke and dementia. Even type 2 diabetes has, in part, been linked to untreated OSA. Symptoms of untreated OSA may include (but may not be limited to) daytime sleepiness, nocturia (i.e. frequent nighttime urination), memory problems, mood irritability and suboptimally controlled or worsening mood disorder such as depression and/or anxiety, lack of energy, lack of motivation, physical discomfort, as well as recurrent headaches, especially morning or nocturnal headaches. We talked about the importance of maintaining a healthy lifestyle and striving for healthy weight.  She is working on weight loss.  She will try to sleep on her sides.  She is agreeable to starting a trial of AutoPap therapy.  We mutually agreed to try her on a low pressure range as she is apprehensive about starting AutoPap therapy.  She would like to avoid a face mask and I suggested either nasal pillows or nasal cushion interface for her.  She is advised to try to be consistent with AutoPap therapy and continue to work on weight loss.  We will plan a follow-up in sleep clinic in about 3 months.  I answered all her questions today and she was in agreement.  Huston Foley, MD, PhD

## 2023-01-28 ENCOUNTER — Other Ambulatory Visit (HOSPITAL_COMMUNITY): Payer: Self-pay

## 2023-01-28 ENCOUNTER — Other Ambulatory Visit: Payer: Self-pay | Admitting: Internal Medicine

## 2023-01-28 DIAGNOSIS — J301 Allergic rhinitis due to pollen: Secondary | ICD-10-CM

## 2023-01-28 MED ORDER — LEVOCETIRIZINE DIHYDROCHLORIDE 5 MG PO TABS
5.0000 mg | ORAL_TABLET | Freq: Every evening | ORAL | 0 refills | Status: DC
Start: 2023-01-28 — End: 2023-05-04
  Filled 2023-01-28: qty 90, 90d supply, fill #0

## 2023-01-28 MED ORDER — MONTELUKAST SODIUM 10 MG PO TABS
10.0000 mg | ORAL_TABLET | Freq: Every day | ORAL | 0 refills | Status: AC
Start: 2023-01-28 — End: ?
  Filled 2023-01-28: qty 90, 90d supply, fill #0

## 2023-01-31 ENCOUNTER — Other Ambulatory Visit (HOSPITAL_COMMUNITY): Payer: Self-pay

## 2023-02-14 ENCOUNTER — Other Ambulatory Visit: Payer: Self-pay | Admitting: Internal Medicine

## 2023-02-14 DIAGNOSIS — K2100 Gastro-esophageal reflux disease with esophagitis, without bleeding: Secondary | ICD-10-CM

## 2023-02-14 DIAGNOSIS — J301 Allergic rhinitis due to pollen: Secondary | ICD-10-CM

## 2023-02-16 ENCOUNTER — Other Ambulatory Visit: Payer: Self-pay | Admitting: Internal Medicine

## 2023-02-16 DIAGNOSIS — K2100 Gastro-esophageal reflux disease with esophagitis, without bleeding: Secondary | ICD-10-CM

## 2023-02-16 DIAGNOSIS — J301 Allergic rhinitis due to pollen: Secondary | ICD-10-CM

## 2023-02-17 ENCOUNTER — Other Ambulatory Visit (HOSPITAL_COMMUNITY): Payer: Self-pay

## 2023-02-17 ENCOUNTER — Telehealth: Payer: Self-pay | Admitting: Internal Medicine

## 2023-02-17 NOTE — Telephone Encounter (Signed)
Prescription Request  02/17/2023  LOV: Visit date not found  What is the name of the medication or equipment? fluticasone (FLONASE) 50 MCG/ACT nasal spray  pantoprazole (PROTONIX) 40 MG tablet   Have you contacted your pharmacy to request a refill? No   Which pharmacy would you like this sent to?  Patient notified that their request is being sent to the clinical staff for review and that they should receive a response within 2 business days.   Please advise at Mobile [phone] (mobile)

## 2023-02-18 ENCOUNTER — Other Ambulatory Visit (HOSPITAL_COMMUNITY): Payer: Self-pay

## 2023-02-20 ENCOUNTER — Encounter: Payer: Self-pay | Admitting: Internal Medicine

## 2023-02-20 NOTE — Telephone Encounter (Signed)
 Noted  

## 2023-02-20 NOTE — Telephone Encounter (Signed)
Patient called asking why the medication was denied. She was informed that she would need a sooner appointment than October. She got very upset and refused to schedule anything any sooner. She said she would be sending a MyChart message about how upset she is.

## 2023-02-20 NOTE — Telephone Encounter (Signed)
Patient called back and was very upset that she cannot get the medication refilled. She was aware that she needs a sooner appointment. She would not let me offer her a sooner appointment because she would not let me get a word in edgewise. She was told she would need a sooner appointment in her earlier phone call.

## 2023-02-24 ENCOUNTER — Encounter: Payer: Self-pay | Admitting: Internal Medicine

## 2023-02-24 ENCOUNTER — Other Ambulatory Visit (HOSPITAL_COMMUNITY): Payer: Self-pay

## 2023-02-24 ENCOUNTER — Ambulatory Visit (INDEPENDENT_AMBULATORY_CARE_PROVIDER_SITE_OTHER): Payer: 59 | Admitting: Internal Medicine

## 2023-02-24 VITALS — BP 138/86 | HR 116 | Temp 98.4°F | Resp 16 | Ht 64.0 in | Wt 174.0 lb

## 2023-02-24 DIAGNOSIS — I10 Essential (primary) hypertension: Secondary | ICD-10-CM

## 2023-02-24 DIAGNOSIS — E519 Thiamine deficiency, unspecified: Secondary | ICD-10-CM

## 2023-02-24 DIAGNOSIS — G4733 Obstructive sleep apnea (adult) (pediatric): Secondary | ICD-10-CM

## 2023-02-24 DIAGNOSIS — J301 Allergic rhinitis due to pollen: Secondary | ICD-10-CM

## 2023-02-24 DIAGNOSIS — D500 Iron deficiency anemia secondary to blood loss (chronic): Secondary | ICD-10-CM | POA: Diagnosis not present

## 2023-02-24 DIAGNOSIS — D538 Other specified nutritional anemias: Secondary | ICD-10-CM | POA: Diagnosis not present

## 2023-02-24 DIAGNOSIS — D510 Vitamin B12 deficiency anemia due to intrinsic factor deficiency: Secondary | ICD-10-CM | POA: Diagnosis not present

## 2023-02-24 DIAGNOSIS — R000 Tachycardia, unspecified: Secondary | ICD-10-CM

## 2023-02-24 DIAGNOSIS — Z0000 Encounter for general adult medical examination without abnormal findings: Secondary | ICD-10-CM | POA: Diagnosis not present

## 2023-02-24 DIAGNOSIS — K2100 Gastro-esophageal reflux disease with esophagitis, without bleeding: Secondary | ICD-10-CM

## 2023-02-24 DIAGNOSIS — Z1211 Encounter for screening for malignant neoplasm of colon: Secondary | ICD-10-CM

## 2023-02-24 MED ORDER — PANTOPRAZOLE SODIUM 40 MG PO TBEC
40.0000 mg | DELAYED_RELEASE_TABLET | Freq: Every day | ORAL | 1 refills | Status: DC
Start: 2023-02-24 — End: 2023-07-21
  Filled 2023-02-24: qty 90, 90d supply, fill #0
  Filled 2023-05-09 – 2023-05-15 (×2): qty 90, 90d supply, fill #1

## 2023-02-24 MED ORDER — NEBIVOLOL HCL 10 MG PO TABS
10.0000 mg | ORAL_TABLET | Freq: Every day | ORAL | 1 refills | Status: DC
Start: 2023-02-24 — End: 2023-07-28
  Filled 2023-02-24: qty 90, 90d supply, fill #0
  Filled 2023-05-23: qty 90, 90d supply, fill #1

## 2023-02-24 NOTE — Patient Instructions (Signed)

## 2023-02-24 NOTE — Progress Notes (Signed)
Subjective:  Patient ID: Felicia Johnson, female    DOB: Feb 09, 1976  Age: 47 y.o. MRN: [id number]  CC: Annual Exam, Gastroesophageal Reflux, Hypertension, and Allergic Rhinitis    HPI Felicia Johnson presents for a CPX and f/up ---   She is active and denies chest pain, shortness of breath, diaphoresis, edema, or palpitations.  Outpatient Medications Prior to Visit  Medication Sig Dispense Refill   Cholecalciferol 50 MCG (2000 UT) TABS Take 1 tablet (2,000 Units total) by mouth daily. 90 tablet 1   levocetirizine (XYZAL) 5 MG tablet Take 1 tablet (5 mg total) by mouth every evening. 90 tablet 0   montelukast (SINGULAIR) 10 MG tablet Take 1 tablet (10 mg total) by mouth at bedtime. 90 tablet 0   thiamine (VITAMIN B-1) 50 MG tablet Take 1 tablet by mouth daily. 90 tablet 1   tranexamic acid (LYSTEDA) 650 MG TABS tablet Take 2 tablets by mouth 3 times a day for 5 days. 30 tablet 11   cyanocobalamin 2000 MCG tablet Take 1 tablet (2,000 mcg total) by mouth daily. 90 tablet 1   ferrous sulfate 325 (65 FE) MG tablet Take 1 tablet (325 mg total) by mouth 2 (two) times daily with a meal. 180 tablet 0   fluticasone (FLONASE) 50 MCG/ACT nasal spray Place 2 sprays into both nostrils daily. 48 g 1   pantoprazole (PROTONIX) 40 MG tablet Take 1 tablet (40 mg total) by mouth daily. 90 tablet 0   No facility-administered medications prior to visit.    ROS Review of Systems  Constitutional: Negative.  Negative for chills, diaphoresis and fatigue.  HENT: Negative.    Eyes: Negative.  Negative for visual disturbance.  Respiratory:  Positive for apnea. Negative for cough, shortness of breath and wheezing.   Cardiovascular:  Negative for chest pain, palpitations and leg swelling.  Gastrointestinal:  Negative for abdominal pain, constipation, diarrhea, nausea and vomiting.  Genitourinary: Negative.  Negative for difficulty urinating.  Musculoskeletal: Negative.  Negative for arthralgias and myalgias.   Skin: Negative.  Negative for color change and pallor.  Neurological: Negative.  Negative for dizziness.  Hematological:  Negative for adenopathy. Does not bruise/bleed easily.  Psychiatric/Behavioral: Negative.      Objective:  BP 138/86 (BP Location: Right Arm, Patient Position: Sitting, Cuff Size: Normal)   Pulse (!) 116   Temp 98.4 F (36.9 C) (Oral)   Resp 16   Ht 5\' 4"  (1.626 m)   Wt 174 lb (78.9 kg)   LMP 02/08/2023 (Exact Date)   SpO2 96%   BMI 29.87 kg/m   BP Readings from Last 3 Encounters:  02/24/23 138/86  01/04/23 114/75  10/21/22 126/84    Wt Readings from Last 3 Encounters:  02/24/23 174 lb (78.9 kg)  01/04/23 174 lb 3.2 oz (79 kg)  10/21/22 170 lb (77.1 kg)    Physical Exam Vitals reviewed.  Constitutional:      Appearance: Normal appearance.  HENT:     Mouth/Throat:     Mouth: Mucous membranes are moist.  Eyes:     General: No scleral icterus.    Conjunctiva/sclera: Conjunctivae normal.  Cardiovascular:     Rate and Rhythm: Regular rhythm. Tachycardia present.     Heart sounds: No murmur heard.    No friction rub. No gallop.  Pulmonary:     Effort: Pulmonary effort is normal.     Breath sounds: No stridor. No wheezing, rhonchi or rales.  Abdominal:     General: Abdomen  is flat.     Palpations: There is no mass.     Tenderness: There is no abdominal tenderness. There is no guarding.     Hernia: No hernia is present.  Musculoskeletal:        General: Normal range of motion.     Cervical back: Normal range of motion and neck supple.     Right lower leg: No edema.     Left lower leg: No edema.  Lymphadenopathy:     Cervical: No cervical adenopathy.  Skin:    General: Skin is warm and dry.  Neurological:     General: No focal deficit present.     Mental Status: She is alert. Mental status is at baseline.  Psychiatric:        Mood and Affect: Mood normal.        Behavior: Behavior normal.        Thought Content: Thought content normal.      Lab Results  Component Value Date   WBC 5.1 03/03/2023   HGB 12.6 03/03/2023   HCT 38.9 03/03/2023   PLT 289.0 03/03/2023   GLUCOSE 95 03/03/2023   CHOL 197 03/03/2023   TRIG 63.0 03/03/2023   HDL 59.70 03/03/2023   LDLDIRECT 134.7 05/10/2013   LDLCALC 125 (H) 03/03/2023   ALT 10 03/03/2023   AST 16 03/03/2023   NA 137 03/03/2023   K 3.9 03/03/2023   CL 106 03/03/2023   CREATININE 0.95 03/03/2023   BUN 7 03/03/2023   CO2 24 03/03/2023   TSH 2.11 02/16/2022    No results found.  Assessment & Plan:   Vitamin B12 deficiency anemia due to intrinsic factor deficiency -     CBC with Differential/Platelet; Future -     Folate; Future -     Vitamin B12; Future  Iron deficiency anemia due to chronic blood loss -     IBC + Ferritin; Future -     CBC with Differential/Platelet; Future  Tachycardia- Will treat with nebivolol. -     Nebivolol HCl; Take 1 tablet (10 mg total) by mouth daily.  Dispense: 90 tablet; Refill: 1 -     Thyroid Panel With TSH; Future  Primary hypertension- She has not achieved her blood pressure goal.  Will treat with nebivolol. -     Nebivolol HCl; Take 1 tablet (10 mg total) by mouth daily.  Dispense: 90 tablet; Refill: 1 -     Thyroid Panel With TSH; Future -     Urinalysis, Routine w reflex microscopic; Future -     Hepatic function panel; Future -     Basic metabolic panel; Future  Gastroesophageal reflux disease with esophagitis -     Pantoprazole Sodium; Take 1 tablet (40 mg total) by mouth daily.  Dispense: 90 tablet; Refill: 1  Anemia due to acquired thiamine deficiency -     Vitamin B1; Future  OSA (obstructive sleep apnea) -     Thyroid Panel With TSH; Future  Routine general medical examination at a health care facility- Exam completed, labs reviewed, vaccines reviewed and updated, cancer screenings addressed, pt ed material was given.  -     Lipid panel; Future  Screening for colon cancer -     Ambulatory referral to  Gastroenterology  Allergic rhinitis due to pollen, unspecified seasonality -     Fluticasone Propionate; Place 2 sprays into both nostrils daily.  Dispense: 48 g; Refill: 1     Follow-up: Return in about 6  months (around 08/27/2023).  Sanda Linger, MD

## 2023-02-25 ENCOUNTER — Other Ambulatory Visit (HOSPITAL_COMMUNITY): Payer: Self-pay

## 2023-02-25 ENCOUNTER — Other Ambulatory Visit: Payer: Self-pay | Admitting: Internal Medicine

## 2023-02-25 DIAGNOSIS — J301 Allergic rhinitis due to pollen: Secondary | ICD-10-CM

## 2023-02-25 MED ORDER — FLUTICASONE PROPIONATE 50 MCG/ACT NA SUSP
2.0000 | Freq: Every day | NASAL | 1 refills | Status: DC
Start: 2023-02-25 — End: 2023-10-12
  Filled 2023-02-25: qty 48, 90d supply, fill #0
  Filled 2023-07-04 – 2023-07-21 (×2): qty 48, 90d supply, fill #1

## 2023-03-03 ENCOUNTER — Other Ambulatory Visit (INDEPENDENT_AMBULATORY_CARE_PROVIDER_SITE_OTHER): Payer: 59

## 2023-03-03 ENCOUNTER — Encounter: Payer: Self-pay | Admitting: Internal Medicine

## 2023-03-03 DIAGNOSIS — D500 Iron deficiency anemia secondary to blood loss (chronic): Secondary | ICD-10-CM

## 2023-03-03 DIAGNOSIS — R000 Tachycardia, unspecified: Secondary | ICD-10-CM

## 2023-03-03 DIAGNOSIS — I10 Essential (primary) hypertension: Secondary | ICD-10-CM

## 2023-03-03 DIAGNOSIS — Z0000 Encounter for general adult medical examination without abnormal findings: Secondary | ICD-10-CM

## 2023-03-03 DIAGNOSIS — E519 Thiamine deficiency, unspecified: Secondary | ICD-10-CM

## 2023-03-03 DIAGNOSIS — D538 Other specified nutritional anemias: Secondary | ICD-10-CM | POA: Diagnosis not present

## 2023-03-03 DIAGNOSIS — G4733 Obstructive sleep apnea (adult) (pediatric): Secondary | ICD-10-CM

## 2023-03-03 DIAGNOSIS — D510 Vitamin B12 deficiency anemia due to intrinsic factor deficiency: Secondary | ICD-10-CM | POA: Diagnosis not present

## 2023-03-03 LAB — HEPATIC FUNCTION PANEL
ALT: 10 U/L (ref 0–35)
AST: 16 U/L (ref 0–37)
Albumin: 3.9 g/dL (ref 3.5–5.2)
Alkaline Phosphatase: 37 U/L — ABNORMAL LOW (ref 39–117)
Bilirubin, Direct: 0.1 mg/dL (ref 0.0–0.3)
Total Bilirubin: 0.4 mg/dL (ref 0.2–1.2)
Total Protein: 7.1 g/dL (ref 6.0–8.3)

## 2023-03-03 LAB — LIPID PANEL
Cholesterol: 197 mg/dL (ref 0–200)
HDL: 59.7 mg/dL (ref >39.00)
LDL Cholesterol: 125 mg/dL — ABNORMAL HIGH (ref 0–99)
NonHDL: 137.78
Total CHOL/HDL Ratio: 3
Triglycerides: 63 mg/dL (ref 0.0–149.0)
VLDL: 12.6 mg/dL (ref 0.0–40.0)

## 2023-03-03 LAB — URINALYSIS, ROUTINE W REFLEX MICROSCOPIC
Bilirubin Urine: NEGATIVE
Hgb urine dipstick: NEGATIVE
Ketones, ur: NEGATIVE
Leukocytes,Ua: NEGATIVE
Nitrite: NEGATIVE
RBC / HPF: NONE SEEN (ref 0–?)
Specific Gravity, Urine: <=1.005 — AB (ref 1.000–1.030)
Total Protein, Urine: NEGATIVE
Urine Glucose: NEGATIVE
Urobilinogen, UA: 0.2 (ref 0.0–1.0)
pH: 7 (ref 5.0–8.0)

## 2023-03-03 LAB — CBC WITH DIFFERENTIAL/PLATELET
Basophils Absolute: 0 10*3/uL (ref 0.0–0.1)
Basophils Relative: 0.6 % (ref 0.0–3.0)
Eosinophils Absolute: 0.1 10*3/uL (ref 0.0–0.7)
Eosinophils Relative: 2 % (ref 0.0–5.0)
HCT: 38.9 % (ref 36.0–46.0)
Hemoglobin: 12.6 g/dL (ref 12.0–15.0)
Lymphocytes Relative: 36.7 % (ref 12.0–46.0)
Lymphs Abs: 1.9 10*3/uL (ref 0.7–4.0)
MCHC: 32.4 g/dL (ref 30.0–36.0)
MCV: 90.7 fl (ref 78.0–100.0)
Monocytes Absolute: 0.5 10*3/uL (ref 0.1–1.0)
Monocytes Relative: 9.5 % (ref 3.0–12.0)
Neutro Abs: 2.6 10*3/uL (ref 1.4–7.7)
Neutrophils Relative %: 51.2 % (ref 43.0–77.0)
Platelets: 289 10*3/uL (ref 150.0–400.0)
RBC: 4.29 Mil/uL (ref 3.87–5.11)
RDW: 15.8 % — ABNORMAL HIGH (ref 11.5–15.5)
WBC: 5.1 10*3/uL (ref 4.0–10.5)

## 2023-03-03 LAB — IBC + FERRITIN
Ferritin: 9.4 ng/mL — ABNORMAL LOW (ref 10.0–291.0)
Iron: 281 ug/dL — ABNORMAL HIGH (ref 42–145)
Saturation Ratios: 80 % — ABNORMAL HIGH (ref 20.0–50.0)
TIBC: 351.4 ug/dL (ref 250.0–450.0)
Transferrin: 251 mg/dL (ref 212.0–360.0)

## 2023-03-03 LAB — FOLATE: Folate: >23.8 ng/mL (ref >5.9)

## 2023-03-03 LAB — VITAMIN B12: Vitamin B-12: >1501 pg/mL — ABNORMAL HIGH (ref 211–911)

## 2023-03-03 LAB — BASIC METABOLIC PANEL
BUN: 7 mg/dL (ref 6–23)
CO2: 24 mEq/L (ref 19–32)
Calcium: 8.8 mg/dL (ref 8.4–10.5)
Chloride: 106 mEq/L (ref 96–112)
Creatinine, Ser: 0.95 mg/dL (ref 0.40–1.20)
GFR: 72.37 mL/min (ref >60.00)
Glucose, Bld: 95 mg/dL (ref 70–99)
Potassium: 3.9 mEq/L (ref 3.5–5.1)
Sodium: 137 mEq/L (ref 135–145)

## 2023-03-04 ENCOUNTER — Other Ambulatory Visit (HOSPITAL_COMMUNITY): Payer: Self-pay

## 2023-03-24 ENCOUNTER — Telehealth: Payer: 59 | Admitting: Adult Health

## 2023-04-03 ENCOUNTER — Encounter: Payer: Self-pay | Admitting: *Deleted

## 2023-04-03 ENCOUNTER — Encounter: Payer: 59 | Admitting: Internal Medicine

## 2023-04-03 NOTE — Progress Notes (Unsigned)
PATIENT: Felicia Johnson DOB: [birth year]  REASON FOR VISIT: follow up HISTORY FROM: patient PRIMARY NEUROLOGIST: Dr. Frances Furbish  Virtual Visit via Video Note  I connected with Felicia Johnson on 04/03/23 at 11:30 AM EDT by a video enabled telemedicine application located remotely at [facility] and verified that I am speaking with the correct person using two identifiers who was located at their own home.   I discussed the limitations of evaluation and management by telemedicine and the availability of in person appointments. The patient expressed understanding and agreed to proceed.   PATIENT: Felicia Johnson DOB: 23-Dec-1975  REASON FOR VISIT: follow up HISTORY FROM: patient  HISTORY OF PRESENT ILLNESS: Today 04/03/23:  Felicia Johnson is a 47 y.o. female with a history of OSA on CPAP. Returns today for follow-up.      HISTORY   REVIEW OF SYSTEMS: Out of a complete 14 system review of symptoms, the patient complains only of the following symptoms, and all other reviewed systems are negative.  ALLERGIES: Allergies  Allergen Reactions   Latex Itching    HOME MEDICATIONS: Outpatient Medications Prior to Visit  Medication Sig Dispense Refill   Cholecalciferol 50 MCG (2000 UT) TABS Take 1 tablet (2,000 Units total) by mouth daily. 90 tablet 1   fluticasone (FLONASE) 50 MCG/ACT nasal spray Place 2 sprays into both nostrils daily. 48 g 1   levocetirizine (XYZAL) 5 MG tablet Take 1 tablet (5 mg total) by mouth every evening. 90 tablet 0   montelukast (SINGULAIR) 10 MG tablet Take 1 tablet (10 mg total) by mouth at bedtime. 90 tablet 0   nebivolol (BYSTOLIC) 10 MG tablet Take 1 tablet (10 mg total) by mouth daily. 90 tablet 1   pantoprazole (PROTONIX) 40 MG tablet Take 1 tablet (40 mg total) by mouth daily. 90 tablet 1   thiamine (VITAMIN B-1) 50 MG tablet Take 1 tablet by mouth daily. 90 tablet 1   tranexamic acid (LYSTEDA) 650 MG TABS tablet Take 2 tablets by  mouth 3 times a day for 5 days. 30 tablet 11   No facility-administered medications prior to visit.    PAST MEDICAL HISTORY: Past Medical History:  Diagnosis Date   Acid reflux    Anemia    Hyperlipidemia     PAST SURGICAL HISTORY: Past Surgical History:  Procedure Laterality Date   TUBAL LIGATION      FAMILY HISTORY: Family History  Problem Relation Age of Onset   Hypertension Mother    Hypertension Sister    Anemia Sister    Atrial fibrillation Maternal Grandfather    Alzheimer's disease Paternal Grandmother    Stroke Other    Cancer Neg Hx    Heart disease Neg Hx    Hyperlipidemia Neg Hx    Diabetes Neg Hx    Drug abuse Neg Hx    Alcohol abuse Neg Hx    Kidney disease Neg Hx     SOCIAL HISTORY: Social History   Socioeconomic History   Marital status: Married    Spouse name: Not on file   Number of children: Not on file   Years of education: Not on file   Highest education level: Not on file  Occupational History   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[employer]  Tobacco Use   Smoking status: Never    Passive exposure: Never   Smokeless tobacco: Never  Vaping Use   Vaping status: Never Used  Substance and Sexual Activity  Alcohol use: Yes    Comment: ocassional   Drug use: Never   Sexual activity: Yes    Partners: Male    Birth control/protection: Surgical  Other Topics Concern   Not on file  Social History Narrative   Regular Exercise -  NO   Caffiene; gingerale   Lives home with children (3) and husband   Social Determinants of Health   Financial Resource Strain: Not on file  Food Insecurity: Not on file  Transportation Needs: Not on file  Physical Activity: Not on file  Stress: Not on file  Social Connections: Not on file  Intimate Partner Violence: Not on file      PHYSICAL EXAM Generalized: Well developed, in no acute distress   Neurological examination  Mentation: Alert oriented to time, place, history taking. Follows all commands speech  and language fluent Cranial nerve II-XII:Extraocular movements were full. Facial symmetry noted. uvula tongue midline. Head turning and shoulder shrug  were normal and symmetric. Motor: Good strength throughout subjectively per patient Sensory: Sensory testing is intact to soft touch on all 4 extremities subjectively per patient Coordination: Cerebellar testing reveals good finger-nose-finger  Gait and station: Patient is able to stand from a seated position. gait is normal.  Reflexes: UTA  DIAGNOSTIC DATA (LABS, IMAGING, TESTING) - I reviewed patient records, labs, notes, testing and imaging myself where available.  Lab Results  Component Value Date   WBC 5.1 03/03/2023   HGB 12.6 03/03/2023   HCT 38.9 03/03/2023   MCV 90.7 03/03/2023   PLT 289.0 03/03/2023      Component Value Date/Time   NA 137 03/03/2023 0855   K 3.9 03/03/2023 0855   CL 106 03/03/2023 0855   CO2 24 03/03/2023 0855   GLUCOSE 95 03/03/2023 0855   BUN 7 03/03/2023 0855   CREATININE 0.95 03/03/2023 0855   CALCIUM 8.8 03/03/2023 0855   PROT 7.1 03/03/2023 0855   ALBUMIN 3.9 03/03/2023 0855   AST 16 03/03/2023 0855   ALT 10 03/03/2023 0855   ALKPHOS 37 (L) 03/03/2023 0855   BILITOT 0.4 03/03/2023 0855   GFRNONAA >60 08/10/2017 0939   GFRAA >60 08/10/2017 0939   Lab Results  Component Value Date   CHOL 197 03/03/2023   HDL 59.70 03/03/2023   LDLCALC 125 (H) 03/03/2023   LDLDIRECT 134.7 05/10/2013   TRIG 63.0 03/03/2023   CHOLHDL 3 03/03/2023   No results found for: "HGBA1C" Lab Results  Component Value Date   VITAMINB12 >1501 (H) 03/03/2023   Lab Results  Component Value Date   TSH 1.70 03/03/2023      ASSESSMENT AND PLAN 47 y.o. year old female  has a past medical history of Acid reflux, Anemia, and Hyperlipidemia. here with:  OSA on CPAP  CPAP compliance excellent Residual AHI is good Encouraged patient to continue using CPAP nightly and > 4 hours each night F/U in 1 year or sooner  if needed  I spent 20 minutes of face-to-face and non-face-to-face time with patient.  This included previsit chart review, lab review, study review, order entry, electronic health record documentation, patient education.  Butch Penny, MSN, NP-C 04/03/2023, 3:37 PM [facility] [address], [address] [place], [place] [address] [phone]

## 2023-04-04 ENCOUNTER — Telehealth (INDEPENDENT_AMBULATORY_CARE_PROVIDER_SITE_OTHER): Payer: 59 | Admitting: Adult Health

## 2023-04-04 DIAGNOSIS — G4733 Obstructive sleep apnea (adult) (pediatric): Secondary | ICD-10-CM | POA: Diagnosis not present

## 2023-04-05 ENCOUNTER — Encounter: Payer: Self-pay | Admitting: *Deleted

## 2023-04-05 DIAGNOSIS — G4733 Obstructive sleep apnea (adult) (pediatric): Secondary | ICD-10-CM

## 2023-04-05 NOTE — Telephone Encounter (Signed)
Order sent to [facility]  

## 2023-04-06 ENCOUNTER — Other Ambulatory Visit (HOSPITAL_COMMUNITY): Payer: Self-pay

## 2023-04-06 ENCOUNTER — Ambulatory Visit
Admission: RE | Admit: 2023-04-06 | Discharge: 2023-04-06 | Disposition: A | Discharge status: Home / Self Care | Payer: 59 | Source: Ambulatory Visit | Attending: Obstetrics and Gynecology | Admitting: Obstetrics and Gynecology

## 2023-04-06 ENCOUNTER — Ambulatory Visit: Payer: 59

## 2023-04-06 DIAGNOSIS — Z1231 Encounter for screening mammogram for malignant neoplasm of breast: Secondary | ICD-10-CM | POA: Diagnosis not present

## 2023-04-06 DIAGNOSIS — Z0000 Encounter for general adult medical examination without abnormal findings: Secondary | ICD-10-CM

## 2023-04-06 NOTE — Telephone Encounter (Signed)
[facility] confirmed receipt of order.  

## 2023-04-14 ENCOUNTER — Other Ambulatory Visit (HOSPITAL_COMMUNITY): Payer: Self-pay

## 2023-04-14 DIAGNOSIS — Z124 Encounter for screening for malignant neoplasm of cervix: Secondary | ICD-10-CM | POA: Diagnosis not present

## 2023-04-14 DIAGNOSIS — Z01419 Encounter for gynecological examination (general) (routine) without abnormal findings: Secondary | ICD-10-CM | POA: Diagnosis not present

## 2023-04-14 MED ORDER — TRANEXAMIC ACID 650 MG PO TABS
1300.0000 mg | ORAL_TABLET | Freq: Three times a day (TID) | ORAL | 11 refills | Status: DC
  Filled 2023-04-14 – 2023-11-27 (×2): qty 30, 5d supply, fill #0

## 2023-04-18 NOTE — Addendum Note (Signed)
Addended by: Bertram Savin on: 04/18/2023 08:40 AM   Modules accepted: Orders

## 2023-04-18 NOTE — Telephone Encounter (Signed)
Pressure has been manually changed on [facility] portal to 5-10 cm H20 and ramp has been turned off per order. Patient advised to take machine and card to [facility] so the pt's name can be changed on machine to reflect current patient.

## 2023-04-19 NOTE — Telephone Encounter (Signed)
[facility] confirmed receipt of order.  

## 2023-04-25 ENCOUNTER — Other Ambulatory Visit (HOSPITAL_COMMUNITY): Payer: Self-pay

## 2023-04-28 ENCOUNTER — Encounter: Payer: Self-pay | Admitting: Physician Assistant

## 2023-04-28 ENCOUNTER — Encounter: Payer: Self-pay | Admitting: Emergency Medicine

## 2023-05-04 ENCOUNTER — Other Ambulatory Visit (HOSPITAL_COMMUNITY): Payer: Self-pay

## 2023-05-04 ENCOUNTER — Other Ambulatory Visit: Payer: Self-pay | Admitting: Internal Medicine

## 2023-05-04 DIAGNOSIS — J301 Allergic rhinitis due to pollen: Secondary | ICD-10-CM

## 2023-05-04 MED ORDER — LEVOCETIRIZINE DIHYDROCHLORIDE 5 MG PO TABS
5.0000 mg | ORAL_TABLET | Freq: Every evening | ORAL | 1 refills | Status: DC
Start: 2023-05-04 — End: 2023-11-09
  Filled 2023-05-04: qty 90, 90d supply, fill #0
  Filled 2023-07-27 – 2023-08-08 (×2): qty 90, 90d supply, fill #1

## 2023-05-06 ENCOUNTER — Other Ambulatory Visit (HOSPITAL_COMMUNITY): Payer: Self-pay

## 2023-05-06 ENCOUNTER — Other Ambulatory Visit: Payer: Self-pay | Admitting: Internal Medicine

## 2023-05-06 DIAGNOSIS — J301 Allergic rhinitis due to pollen: Secondary | ICD-10-CM

## 2023-05-06 MED ORDER — MONTELUKAST SODIUM 10 MG PO TABS
10.0000 mg | ORAL_TABLET | Freq: Every day | ORAL | 0 refills | Status: DC
Start: 2023-05-06 — End: 2023-08-08
  Filled 2023-05-06: qty 90, 90d supply, fill #0

## 2023-05-09 ENCOUNTER — Other Ambulatory Visit (HOSPITAL_COMMUNITY): Payer: Self-pay

## 2023-05-18 ENCOUNTER — Encounter: Payer: 59 | Admitting: Internal Medicine

## 2023-05-26 ENCOUNTER — Other Ambulatory Visit (HOSPITAL_COMMUNITY): Payer: Self-pay

## 2023-07-17 ENCOUNTER — Other Ambulatory Visit (HOSPITAL_COMMUNITY): Payer: Self-pay

## 2023-07-21 ENCOUNTER — Other Ambulatory Visit: Payer: Self-pay

## 2023-07-21 ENCOUNTER — Other Ambulatory Visit (HOSPITAL_COMMUNITY): Payer: Self-pay

## 2023-07-21 ENCOUNTER — Other Ambulatory Visit: Payer: Self-pay | Admitting: Internal Medicine

## 2023-07-21 DIAGNOSIS — K2100 Gastro-esophageal reflux disease with esophagitis, without bleeding: Secondary | ICD-10-CM

## 2023-07-21 MED ORDER — PANTOPRAZOLE SODIUM 40 MG PO TBEC
40.0000 mg | DELAYED_RELEASE_TABLET | Freq: Every day | ORAL | 0 refills | Status: DC
Start: 2023-07-21 — End: 2023-07-28
  Filled 2023-07-21 – 2023-07-27 (×2): qty 90, 90d supply, fill #0
  Filled ????-??-??: fill #0

## 2023-07-27 ENCOUNTER — Emergency Department (HOSPITAL_COMMUNITY): Payer: 59

## 2023-07-27 ENCOUNTER — Observation Stay (HOSPITAL_COMMUNITY)
Admission: EM | Admit: 2023-07-27 | Discharge: 2023-07-28 | Disposition: A | Discharge status: Home / Self Care | Payer: 59 | Attending: Internal Medicine | Admitting: Internal Medicine

## 2023-07-27 ENCOUNTER — Encounter (HOSPITAL_COMMUNITY): Payer: Self-pay | Admitting: Emergency Medicine

## 2023-07-27 ENCOUNTER — Other Ambulatory Visit (HOSPITAL_COMMUNITY): Payer: Self-pay

## 2023-07-27 ENCOUNTER — Other Ambulatory Visit: Payer: Self-pay

## 2023-07-27 DIAGNOSIS — R778 Other specified abnormalities of plasma proteins: Secondary | ICD-10-CM | POA: Diagnosis not present

## 2023-07-27 DIAGNOSIS — I700 Atherosclerosis of aorta: Secondary | ICD-10-CM | POA: Diagnosis not present

## 2023-07-27 DIAGNOSIS — I10 Essential (primary) hypertension: Secondary | ICD-10-CM | POA: Diagnosis not present

## 2023-07-27 DIAGNOSIS — D259 Leiomyoma of uterus, unspecified: Secondary | ICD-10-CM | POA: Insufficient documentation

## 2023-07-27 DIAGNOSIS — R000 Tachycardia, unspecified: Secondary | ICD-10-CM

## 2023-07-27 DIAGNOSIS — K2100 Gastro-esophageal reflux disease with esophagitis, without bleeding: Secondary | ICD-10-CM | POA: Insufficient documentation

## 2023-07-27 DIAGNOSIS — Z79899 Other long term (current) drug therapy: Secondary | ICD-10-CM | POA: Diagnosis not present

## 2023-07-27 DIAGNOSIS — R079 Chest pain, unspecified: Secondary | ICD-10-CM | POA: Diagnosis present

## 2023-07-27 DIAGNOSIS — R0789 Other chest pain: Secondary | ICD-10-CM | POA: Diagnosis not present

## 2023-07-27 DIAGNOSIS — R7989 Other specified abnormal findings of blood chemistry: Principal | ICD-10-CM

## 2023-07-27 DIAGNOSIS — Z91040 Latex allergy status: Secondary | ICD-10-CM | POA: Insufficient documentation

## 2023-07-27 DIAGNOSIS — K30 Functional dyspepsia: Secondary | ICD-10-CM | POA: Diagnosis not present

## 2023-07-27 LAB — CBC WITH DIFFERENTIAL/PLATELET
Abs Immature Granulocytes: 0.01 10*3/uL (ref 0.00–0.07)
Basophils Absolute: 0 10*3/uL (ref 0.0–0.1)
Basophils Relative: 0 %
Eosinophils Absolute: 0.1 10*3/uL (ref 0.0–0.5)
Eosinophils Relative: 2 %
HCT: 33.8 % — ABNORMAL LOW (ref 36.0–46.0)
Hemoglobin: 10.9 g/dL — ABNORMAL LOW (ref 12.0–15.0)
Immature Granulocytes: 0 %
Lymphocytes Relative: 38 %
Lymphs Abs: 2.4 10*3/uL (ref 0.7–4.0)
MCH: 28.1 pg (ref 26.0–34.0)
MCHC: 32.2 g/dL (ref 30.0–36.0)
MCV: 87.1 fL (ref 80.0–100.0)
Monocytes Absolute: 0.6 10*3/uL (ref 0.1–1.0)
Monocytes Relative: 9 %
Neutro Abs: 3.3 10*3/uL (ref 1.7–7.7)
Neutrophils Relative %: 51 %
Platelets: 280 10*3/uL (ref 150–400)
RBC: 3.88 MIL/uL (ref 3.87–5.11)
RDW: 15.7 % — ABNORMAL HIGH (ref 11.5–15.5)
WBC: 6.4 10*3/uL (ref 4.0–10.5)
nRBC: 0 % (ref 0.0–0.2)

## 2023-07-27 LAB — HCG, SERUM, QUALITATIVE: Preg, Serum: NEGATIVE

## 2023-07-27 LAB — COMPREHENSIVE METABOLIC PANEL
ALT: 13 U/L (ref 0–44)
AST: 19 U/L (ref 15–41)
Albumin: 3.4 g/dL — ABNORMAL LOW (ref 3.5–5.0)
Alkaline Phosphatase: 37 U/L — ABNORMAL LOW (ref 38–126)
Anion gap: 10 (ref 5–15)
BUN: 9 mg/dL (ref 6–20)
CO2: 23 mmol/L (ref 22–32)
Calcium: 8.9 mg/dL (ref 8.9–10.3)
Chloride: 103 mmol/L (ref 98–111)
Creatinine, Ser: 0.87 mg/dL (ref 0.44–1.00)
GFR, Estimated: >60 mL/min (ref >60)
Glucose, Bld: 124 mg/dL — ABNORMAL HIGH (ref 70–99)
Potassium: 3.5 mmol/L (ref 3.5–5.1)
Sodium: 136 mmol/L (ref 135–145)
Total Bilirubin: 0.4 mg/dL (ref <1.2)
Total Protein: 6.7 g/dL (ref 6.5–8.1)

## 2023-07-27 LAB — URINALYSIS, ROUTINE W REFLEX MICROSCOPIC
Bilirubin Urine: NEGATIVE
Glucose, UA: NEGATIVE mg/dL
Ketones, ur: 5 mg/dL — AB
Leukocytes,Ua: NEGATIVE
Nitrite: NEGATIVE
Protein, ur: NEGATIVE mg/dL
Specific Gravity, Urine: 1.008 (ref 1.005–1.030)
pH: 6 (ref 5.0–8.0)

## 2023-07-27 LAB — LIPASE, BLOOD: Lipase: 26 U/L (ref 11–51)

## 2023-07-27 LAB — TROPONIN I (HIGH SENSITIVITY)
Troponin I (High Sensitivity): 16 ng/L (ref <18)
Troponin I (High Sensitivity): 118 ng/L (ref <18)
Troponin I (High Sensitivity): 125 ng/L (ref <18)

## 2023-07-27 MED ORDER — ASPIRIN 81 MG PO CHEW
324.0000 mg | CHEWABLE_TABLET | Freq: Once | ORAL | Status: AC
  Administered 2023-07-27: 324 mg via ORAL
  Filled 2023-07-27: qty 4

## 2023-07-27 MED ORDER — IOHEXOL 350 MG/ML SOLN
80.0000 mL | Freq: Once | INTRAVENOUS | Status: AC | PRN
  Administered 2023-07-27: 80 mL via INTRAVENOUS

## 2023-07-27 NOTE — ED Provider Notes (Signed)
  Physical Exam  BP 115/73   Pulse 78   Temp 98.2 F (36.8 C) (Oral)   Resp 16   SpO2 100%   Physical Exam  Procedures  Procedures  ED Course / MDM   Clinical Course as of 07/28/23 0338  Fri Jul 28, 2023  0218 Consult to cardiologist Dr. Noemi Chapel, who states she feels the patient's troponin elevation is insignificant, and as it is down trending, she feels there is no indication for admission from a cardiologic stand point. She states she feels pain is likely related to large uterine mass, though it was made clear patient's pain was isolated to the chest.   [RS]  0335 See ED course above for documentation of consultation for on-call cardiologist.  I disagree with her the cardiologists recommended disposition plan.  This is a 47 year old female with strong family history and concerning story of chest pain today with elevated troponins with unclear cause.  Additionally patient's cardiac anatomy is unknown as she has never undergone a Cardiologic workup.  Feel the most appropriate and safest disposition for this patient is to be admitted to the hospital for further evaluation.  Consult to hospital medicine provider Dr. Maisie Fus who agrees with disposition for admission and will see the patient in the emergency department and proceed with Cardiologic workup in the inpatient setting.  I appreciate her collaboration in the care of this patient. [RS]    Clinical Course User Index [RS] Dax Murguia, Eugene Gavia, PA-C   Medical Decision Making Amount and/or Complexity of Data Reviewed Radiology: ordered.  Risk OTC drugs. Prescription drug management. Decision regarding hospitalization.    Care of this patient assumed from preceding ED provider Dr. Miguel Dibble; please see his associated note for further insight into the patient's ED course. In brief, 47 y/o female with transient episode of chest pain return to the back now resolved.  Uptrending troponins in the emerged from 16, 2018, 09/09/2023 with  dynamic EKG changes in the ED.  At time of shift change pending CT dissection study to rule out aortic dissection.  Differential also includes SCAD, NSTEMI.  Patient administered aspirin, will hold heparin until reconsultation of cardiology.  Dissection study reassuring.  Large uterine mass is known to the patient and she follows with gynecology for same.  Troponins were trending 16, 118, 125, 110.  Despite downtrending troponin, etiology remains unclear in a patient who is cardiac anatomy is unknown.  Clinical concern for underlying Cardiologic abnormality that warrants further evaluation in the inpatient setting.  Consult to hospitalist as above.  Patient admitted to her service.  Aaiza  voiced understanding of her medical evaluation and treatment plan. Each of their questions answered to their expressed satisfaction.  Return precautions were given.  Patient is well-appearing, stable, and was discharged in good condition.  This chart was dictated using voice recognition software, Dragon. Despite the best efforts of this provider to proofread and correct errors, errors may still occur which can change documentation meaning.      Paris Lore, PA-C 07/28/23 1610    Gilda Crease, MD 07/28/23 [phone]

## 2023-07-27 NOTE — ED Triage Notes (Signed)
Pt has hx of GERD.  Takes protonix.  Had an episode at work of upper back pressure and pressure in her chest with diaphoresis.  Pt reports after being here had belching and felt better but still is concerned about the sudden onset nature and diaphoresis.

## 2023-07-27 NOTE — ED Provider Triage Note (Signed)
Emergency Medicine Provider Triage Evaluation Note  Felicia Johnson , a 47 y.o. female  was evaluated in triage.  Pt complains of chest pain and diaphoresis.  History of acid reflux.  Takes Protonix.  Belch twice and feels improved.  Became diaphoretic earlier..  Review of Systems  Positive: Chest pain Negative: Adding  Physical Exam  BP 116/68 (BP Location: Right Arm)   Pulse 70   Temp 97.6 F (36.4 C)   Resp 17   SpO2 99%  Gen:   Awake, no distress   Resp:  Normal effort  MSK:   Moves extremities without difficulty  Other:    Medical Decision Making  Medically screening exam initiated at 5:02 PM.  Appropriate orders placed.  Felicia Johnson was informed that the remainder of the evaluation will be completed by another provider, this initial triage assessment does not replace that evaluation, and the importance of remaining in the ED until their evaluation is complete.     Arthor Captain, PA-C 07/27/23 [phone]

## 2023-07-27 NOTE — ED Provider Notes (Signed)
[place] EMERGENCY DEPARTMENT AT [facility] Provider Note  HPI   Felicia Johnson is a 47 y.o. female patient with a PMHx of hypertension GERD on medication here today with concern for chest pain.  At around 5 PM, the patient started having bilateral chest pain that wraps around bilaterally to the back.  This described as a pressure and did not radiate anywhere else, it lasted for about 20 to 30 minutes.  She had no fevers and chills, no recent symptoms otherwise, she was sitting down when this happened at work, she came in for further evaluation    ROS Negative except as per HPI   Medical Decision Making   Upon presentation, the patient is afebrile hemodynamically stable.  Prior to me seeing the patient, the patient already had a large workup done, including a first troponin that was negative and then a second 1 of 118, she had a negative lipase and normal metabolic panel, a urine that showed rare bacteria but no other signs of infection, negative pregnancy, and hemoglobin of 10.9 which is just slightly lower than prior.  She already had a chest x-ray which showed no acute cardiopulmonary process   Of note, I reviewed her ECG, and it shows that there is new T wave inversion in lead III only with no other focal findings, the patient was brought back from the waiting room to the emergency room bed after the second troponin was elevated, I immediately evaluated the patient, we obtained a second EKG, and it looks like the t wave inversions in 3 has resolved but there are new T wave inversions in 2 aVF and V2.  Given the history, and the dynamic EKG changes, I reached out to cardiology after I put in a dose of full aspirin.  Collectively, Cardiology and I discussed the differential of aortic dissection, which I had initially considered immediately after seeing the patient, although this is slightly less likely as she has good 4 pulses in all 4 extremities, sensation all 4 extremities, is  not hypertensive, is not tachycardic, and has not had continued pain.  However, given the history of the chest pain wrapping to the back, we do want to rule this out, and we will order an aortic dissection scan at this time.  Additionally, we considered diagnosis of pulmonary embolism however she is PERC negative, so I think we can safely rule that out as well, and the aortic dissection scan will catch a large pulmonary embolism as well.  Will obtain a third troponin now, and then also obtain repeat ECGs as needed if there is any change in clinical status.  Have also considered GERD as she does have a history of this, however it does not really sound like her typical GERD symptoms, we have also consider diagnosis of spontaneous coronary artery dissection, although this would also present more so as a STEMI, and continued chest pain and would not have resolved on its own.   I have transferred care of this patient to the incoming emergency medicine resident at the end of my shift. I have discussed the patient's HPI, physical exam, and workup and therapeutics with the incoming team. Please see their documentation for the remainder of care. Patient is pending CTA dissection.  Physicians assistant will reach out to cardiology again after repeat troponin and dissection studies are back, we are holding deliberately on aspirin given the fact that she could have a dissection  No diagnosis found.  @DISPOSITION @  Rx /  DC Orders ED Discharge Orders     None        Past Medical History:  Diagnosis Date   Acid reflux    Anemia    Hyperlipidemia    Past Surgical History:  Procedure Laterality Date   TUBAL LIGATION     Family History  Problem Relation Age of Onset   Hypertension Mother    Hypertension Sister    Anemia Sister    Atrial fibrillation Maternal Grandfather    Alzheimer's disease Paternal Grandmother    Stroke Other    Cancer Neg Hx    Heart disease Neg Hx    Hyperlipidemia Neg Hx     Diabetes Neg Hx    Drug abuse Neg Hx    Alcohol abuse Neg Hx    Kidney disease Neg Hx    Social History   Socioeconomic History   Marital status: Married    Spouse name: Not on file   Number of children: Not on file   Years of education: Not on file   Highest education level: Not on file  Occupational History   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[employer]  Tobacco Use   Smoking status: Never    Passive exposure: Never   Smokeless tobacco: Never  Vaping Use   Vaping status: Never Used  Substance and Sexual Activity   Alcohol use: Yes    Comment: ocassional   Drug use: Never   Sexual activity: Yes    Partners: Male    Birth control/protection: Surgical  Other Topics Concern   Not on file  Social History Narrative   Regular Exercise -  NO   Caffiene; gingerale   Lives home with children (3) and husband   Social Drivers of Corporate investment banker Strain: Not on file  Food Insecurity: Not on file  Transportation Needs: Not on file  Physical Activity: Not on file  Stress: Not on file  Social Connections: Not on file  Intimate Partner Violence: Not on file     Physical Exam   Vitals:   07/27/23 1619 07/27/23 2214  BP: 116/68 109/67  Pulse: 70 83  Resp: 17 (!) 21  Temp: 97.6 F (36.4 C) 98.2 F (36.8 C)  TempSrc:  Oral  SpO2: 99% 100%    Physical Exam Vitals and nursing note reviewed.  Constitutional:      General: She is not in acute distress.    Appearance: She is well-developed.  HENT:     Head: Normocephalic and atraumatic.  Eyes:     Conjunctiva/sclera: Conjunctivae normal.  Cardiovascular:     Rate and Rhythm: Normal rate and regular rhythm.     Heart sounds: No murmur heard. Pulmonary:     Effort: Pulmonary effort is normal. No respiratory distress.     Breath sounds: Normal breath sounds.  Abdominal:     Palpations: Abdomen is soft.     Tenderness: There is no abdominal tenderness.  Musculoskeletal:        General: No swelling.     Cervical  back: Neck supple.     Comments: No calf tenderness or pain  Skin:    General: Skin is warm and dry.     Capillary Refill: Capillary refill takes less than 2 seconds.  Neurological:     Mental Status: She is alert.  Psychiatric:        Mood and Affect: Mood normal.      Procedures   If procedures were preformed on this patient,  they are listed below:  Procedures  The patient was seen, evaluated, and treated in conjunction with the attending physician, who voiced agreement in the care provided.  Note generated using Dragon voice dictation software and may contain dictation errors. Please contact me for any clarification or with any questions.   Electronically signed by:  Osvaldo Shipper, M.D. (PGY-2)    Gunnar Bulla, MD 07/27/23 2325    Melene Plan, DO 07/28/23 [phone]

## 2023-07-28 ENCOUNTER — Other Ambulatory Visit (HOSPITAL_COMMUNITY): Payer: Self-pay

## 2023-07-28 ENCOUNTER — Observation Stay (HOSPITAL_COMMUNITY): Payer: 59

## 2023-07-28 DIAGNOSIS — K2100 Gastro-esophageal reflux disease with esophagitis, without bleeding: Secondary | ICD-10-CM | POA: Diagnosis not present

## 2023-07-28 DIAGNOSIS — R000 Tachycardia, unspecified: Secondary | ICD-10-CM | POA: Diagnosis not present

## 2023-07-28 DIAGNOSIS — R079 Chest pain, unspecified: Secondary | ICD-10-CM | POA: Diagnosis not present

## 2023-07-28 DIAGNOSIS — R7989 Other specified abnormal findings of blood chemistry: Secondary | ICD-10-CM | POA: Diagnosis not present

## 2023-07-28 DIAGNOSIS — I10 Essential (primary) hypertension: Secondary | ICD-10-CM

## 2023-07-28 LAB — ECHOCARDIOGRAM COMPLETE
AR max vel: 2.38 cm2
AV Peak grad: 5.6 mm[Hg]
Ao pk vel: 1.19 m/s
Area-P 1/2: 4.89 cm2
S' Lateral: 2.65 cm

## 2023-07-28 LAB — CREATININE, SERUM
Creatinine, Ser: 0.94 mg/dL (ref 0.44–1.00)
GFR, Estimated: >60 mL/min (ref >60)

## 2023-07-28 LAB — CBC
HCT: 33.6 % — ABNORMAL LOW (ref 36.0–46.0)
Hemoglobin: 10.9 g/dL — ABNORMAL LOW (ref 12.0–15.0)
MCH: 27.8 pg (ref 26.0–34.0)
MCHC: 32.4 g/dL (ref 30.0–36.0)
MCV: 85.7 fL (ref 80.0–100.0)
Platelets: 284 10*3/uL (ref 150–400)
RBC: 3.92 MIL/uL (ref 3.87–5.11)
RDW: 15.7 % — ABNORMAL HIGH (ref 11.5–15.5)
WBC: 6.7 10*3/uL (ref 4.0–10.5)
nRBC: 0 % (ref 0.0–0.2)

## 2023-07-28 LAB — TROPONIN I (HIGH SENSITIVITY): Troponin I (High Sensitivity): 110 ng/L (ref <18)

## 2023-07-28 LAB — HIV ANTIBODY (ROUTINE TESTING W REFLEX): HIV Screen 4th Generation wRfx: NONREACTIVE

## 2023-07-28 MED ORDER — PANTOPRAZOLE SODIUM 40 MG PO TBEC
40.0000 mg | DELAYED_RELEASE_TABLET | Freq: Two times a day (BID) | ORAL | 3 refills | Status: DC
  Filled 2023-07-28: qty 90, 45d supply, fill #0

## 2023-07-28 MED ORDER — FLUTICASONE PROPIONATE 50 MCG/ACT NA SUSP
2.0000 | Freq: Every day | NASAL | Status: DC
  Administered 2023-07-28: 2 via NASAL
  Filled 2023-07-28: qty 16

## 2023-07-28 MED ORDER — PANTOPRAZOLE SODIUM 40 MG PO TBEC
40.0000 mg | DELAYED_RELEASE_TABLET | Freq: Two times a day (BID) | ORAL | Status: DC
  Administered 2023-07-28: 40 mg via ORAL
  Filled 2023-07-28: qty 1

## 2023-07-28 MED ORDER — VITAMIN D 25 MCG (1000 UNIT) PO TABS
2000.0000 [IU] | ORAL_TABLET | Freq: Every day | ORAL | Status: DC
  Administered 2023-07-28: 2000 [IU] via ORAL
  Filled 2023-07-28: qty 2

## 2023-07-28 MED ORDER — THIAMINE HCL 100 MG PO TABS
50.0000 mg | ORAL_TABLET | Freq: Every day | ORAL | Status: DC
Start: 2023-07-28 — End: 2023-07-28
  Administered 2023-07-28: 50 mg via ORAL
  Filled 2023-07-28: qty 1

## 2023-07-28 MED ORDER — MONTELUKAST SODIUM 10 MG PO TABS: 10.0000 mg | ORAL_TABLET | Freq: Every day | ORAL | Status: DC

## 2023-07-28 MED ORDER — ATORVASTATIN CALCIUM 40 MG PO TABS
40.0000 mg | ORAL_TABLET | Freq: Every day | ORAL | 3 refills | Status: DC
  Filled 2023-07-28: qty 30, 30d supply, fill #0
  Filled 2023-08-22: qty 30, 30d supply, fill #1
  Filled 2023-09-22: qty 30, 30d supply, fill #2
  Filled 2023-10-23: qty 30, 30d supply, fill #3

## 2023-07-28 MED ORDER — CETIRIZINE HCL 10 MG PO TABS
10.0000 mg | ORAL_TABLET | Freq: Every evening | ORAL | Status: DC
  Filled 2023-07-28: qty 1

## 2023-07-28 MED ORDER — ALUM & MAG HYDROXIDE-SIMETH 200-200-20 MG/5ML PO SUSP
30.0000 mL | Freq: Once | ORAL | Status: DC
  Filled 2023-07-28: qty 30

## 2023-07-28 MED ORDER — HEPARIN SODIUM (PORCINE) 5000 UNIT/ML IJ SOLN
5000.0000 [IU] | Freq: Three times a day (TID) | INTRAMUSCULAR | Status: DC
  Administered 2023-07-28: 5000 [IU] via SUBCUTANEOUS
  Filled 2023-07-28: qty 1

## 2023-07-28 MED ORDER — ASPIRIN 81 MG PO TBEC
81.0000 mg | DELAYED_RELEASE_TABLET | Freq: Every day | ORAL | 12 refills | Status: DC
  Filled 2023-07-28: qty 30, 30d supply, fill #0
  Filled 2023-08-22: qty 30, 30d supply, fill #1
  Filled 2023-09-22: qty 30, 30d supply, fill #2
  Filled 2023-10-23: qty 30, 30d supply, fill #3
  Filled 2023-11-24: qty 30, 30d supply, fill #4
  Filled 2024-01-09: qty 30, 30d supply, fill #5

## 2023-07-28 MED ORDER — ASPIRIN 81 MG PO TBEC
81.0000 mg | DELAYED_RELEASE_TABLET | Freq: Every day | ORAL | Status: DC
  Administered 2023-07-28: 81 mg via ORAL
  Filled 2023-07-28: qty 1

## 2023-07-28 MED ORDER — NEBIVOLOL HCL 10 MG PO TABS
10.0000 mg | ORAL_TABLET | Freq: Every day | ORAL | Status: DC
  Administered 2023-07-28: 10 mg via ORAL
  Filled 2023-07-28: qty 1

## 2023-07-28 MED ORDER — ACETAMINOPHEN 325 MG PO TABS: 650.0000 mg | ORAL_TABLET | ORAL | Status: DC | PRN

## 2023-07-28 MED ORDER — ONDANSETRON HCL 4 MG/2ML IJ SOLN
4.0000 mg | Freq: Four times a day (QID) | INTRAMUSCULAR | Status: DC | PRN
Start: 2023-07-28 — End: 2023-07-28

## 2023-07-28 MED ORDER — ATORVASTATIN CALCIUM 40 MG PO TABS: 40.0000 mg | ORAL_TABLET | Freq: Every day | ORAL | Status: DC

## 2023-07-28 MED ORDER — NEBIVOLOL HCL 10 MG PO TABS
10.0000 mg | ORAL_TABLET | Freq: Every day | ORAL | 1 refills | Status: DC
  Filled 2023-07-28 – 2023-08-22 (×2): qty 90, 90d supply, fill #0
  Filled 2023-11-24: qty 90, 90d supply, fill #1

## 2023-07-28 NOTE — Discharge Summary (Signed)
Physician Discharge Summary   Patient: Felicia Johnson MRN: [id number] DOB: [birth year]  Admit date:     07/27/2023  Discharge date: 07/28/23  Discharge Physician: Thad Ranger, MD    PCP: Etta Grandchild, MD   Recommendations at discharge:    Started on ASA 81mg  daily and Lipitor 80mg  PO at bedtime  Outpatient follow-up scheduled with cardiology on 08/10/23 at 8:50 AM.  Recommended GI follow-up for severe GERD/esophagitis   Discharge Diagnoses:    Gastroesophageal reflux disease with esophagitis   Primary hypertension   Atypical Chest pain   Uterine fibroid    Hospital Course:  47 year old female with HTN, HLP, GERD, uterine fibroid presented with severe chest pain, bandlike while she was sitting at her desk at work with diaphoresis, nausea.  No shortness of breath.  Prior to this was in normal state of health.  It lasted around 30 minutes, off and on twinges like chest pain in last 3 months.  Has history of gastritis.  Also noted her symptoms improved after burping twice.   Assessment and Plan:  Atypical chest pain -Has story of HTN, hyperlipidemia, no prior cardiac workup -Also has history of gastritis, possibility of chest pain with GI origin -Takes Protonix 40 mg daily. -CTA negative for PE and aortic dissection  -2D echo showed EF 60-65%, no regional wall motion abnormalities - cardiology was consulted, see by Dr Lynnette Caffey, recommended ASA, statin and possible out patient coronary CTA.     Gastritis/GERD -Takes Protonix 40 mg daily, will increase to twice daily for a month -She has appointment with [facility] gastroenterology next month for colonoscopy.  Recommended to discuss with GI for possible EGD     HTN -Resumed Bystolic     Hyperlipidemia -started lipitor    Uterine fibroid -CTA chest abdomen pelvis showed large 15.1 cm mass originating from the uterine wall with central necrosis favored to present to degenerating fibroid -Discussed in detail with the patient, she  is aware of the uterine fibroid and has been following closely with GYN outpatient.         Pain control - [employer] Controlled Substance Reporting System database was reviewed. and patient was instructed, not to drive, operate heavy machinery, perform activities at heights, swimming or participation in water activities or provide baby-sitting services while on Pain, Sleep and Anxiety Medications; until their outpatient Physician has advised to do so again. Also recommended to not to take more than prescribed Pain, Sleep and Anxiety Medications.  Consultants: cardiology, Dr Lynnette Caffey  Procedures performed: echo  Disposition: Home Diet recommendation:  Discharge Diet Orders (From admission, onward)     Start     Ordered   07/28/23 0000  Diet - low sodium heart healthy        07/28/23 1743           Cardiac diet DISCHARGE MEDICATION: Allergies as of 07/28/2023       Reactions   Latex Itching        Medication List     STOP taking these medications    tranexamic acid 650 MG Tabs tablet Commonly known as: LYSTEDA       TAKE these medications    aspirin EC 81 MG tablet Take 1 tablet (81 mg total) by mouth daily. Swallow whole. Start taking on: July 29, 2023   atorvastatin 40 MG tablet Commonly known as: LIPITOR Take 1 tablet (40 mg total) by mouth at bedtime.   Beet Root 500 MG Caps Take 500 mg by  mouth daily.   Cholecalciferol 50 MCG (2000 UT) Tabs Take 1 tablet (2,000 Units total) by mouth daily.   fluticasone 50 MCG/ACT nasal spray Commonly known as: FLONASE Place 2 sprays into both nostrils daily.   levocetirizine 5 MG tablet Commonly known as: XYZAL Take 1 tablet (5 mg total) by mouth every evening.   montelukast 10 MG tablet Commonly known as: SINGULAIR Take 1 tablet (10 mg total) by mouth at bedtime.   nebivolol 10 MG tablet Commonly known as: Bystolic Take 1 tablet (10 mg total) by mouth daily.   pantoprazole 40 MG  tablet Commonly known as: PROTONIX Take 1 tablet (40 mg total) by mouth 2 (two) times daily before a meal. Take protonix 1 tab (40mg ) twice a day for 1 month, then continue 1 tab (40mg ) once daily What changed:  when to take this additional instructions   thiamine 50 MG tablet Commonly known as: VITAMIN B-1 Take 1 tablet by mouth daily.        Follow-up Information     Etta Grandchild, MD. Schedule an appointment as soon as possible for a visit in 2 week(s).   Specialty: Internal Medicine Why: for hospital follow-up Contact information: [address] [place] [address] [phone]         Reather Littler D, NP Follow up on 08/10/2023.   Specialty: Cardiology Why: 8:50 AM, for hospital follow-up Contact information: [address] [place] [address] [place] [address] [phone]                Discharge Exam: S: Cleared by cardiology to discharge home.   BP (!) 111/53 (BP Location: Right Arm)   Pulse 70   Temp 98.5 F (36.9 C) (Oral)   Resp 15   SpO2 100%   Physical Exam General: Alert and oriented x 3, NAD Cardiovascular: S1 S2 clear, RRR.  Respiratory: CTAB, no wheezing, rales or rhonchi Gastrointestinal: Soft, nontender, nondistended, NBS Ext: no pedal edema bilaterally Neuro: no new deficits Skin: No rashes Psych: Normal affect and demeanor, alert and oriented x3    Condition at discharge: fair  The results of significant diagnostics from this hospitalization (including imaging, microbiology, ancillary and laboratory) are listed below for reference.   Imaging Studies: ECHOCARDIOGRAM COMPLETE Result Date: 07/28/2023    ECHOCARDIOGRAM REPORT   [name] Date of Exam: 07/28/2023 Medical Rec #:  [id number]     Height:       64.0 in Accession #:    [id number]    Weight:       174.0 lb Date of Birth:  [birth year]     BSA:          1.844 m Patient Age:    47 years      BP:           111/53 mmHg Patient Gender: F             HR:            73 bpm. Exam Location:  Inpatient Procedure: Cardiac Doppler, Color Doppler, 3D Echo and 2D Echo Indications:    Chest Pain R07.9  History:        Patient has no prior history of Echocardiogram examinations.                 Arrythmias:Tachycardia, Signs/Symptoms:Chest Pain; Risk                 Factors:Hypertension, Sleep Apnea and Dyslipidemia.  Sonographer:    Lucendia Herrlich RCS Referring Phys: [id number] SARA-MAIZ A THOMAS IMPRESSIONS  1. Left ventricular ejection fraction, by estimation, is 60 to 65%. Left ventricular ejection fraction by 3D volume is 67 %. The left ventricle has normal function. The left ventricle has no regional wall motion abnormalities. Left ventricular diastolic  parameters were normal.  2. Right ventricular systolic function is normal. The right ventricular size is normal. There is normal pulmonary artery systolic pressure. The estimated right ventricular systolic pressure is 24.0 mmHg.  3. The mitral valve is normal in structure. Trivial mitral valve regurgitation. No evidence of mitral stenosis.  4. The aortic valve is tricuspid. Aortic valve regurgitation is trivial. No aortic stenosis is present.  5. The inferior vena cava is normal in size with greater than 50% respiratory variability, suggesting right atrial pressure of 3 mmHg. FINDINGS  Left Ventricle: Left ventricular ejection fraction, by estimation, is 60 to 65%. Left ventricular ejection fraction by 3D volume is 67 %. The left ventricle has normal function. The left ventricle has no regional wall motion abnormalities. The left ventricular internal cavity size was normal in size. There is no left ventricular hypertrophy. Left ventricular diastolic parameters were normal. Right Ventricle: The right ventricular size is normal. No increase in right ventricular wall thickness. Right ventricular systolic function is normal. There is normal pulmonary artery systolic pressure. The tricuspid regurgitant velocity is 2.29 m/s, and   with an assumed right atrial pressure of 3 mmHg, the estimated right ventricular systolic pressure is 24.0 mmHg. Left Atrium: Left atrial size was normal in size. Right Atrium: Right atrial size was normal in size. Pericardium: There is no evidence of pericardial effusion. Mitral Valve: The mitral valve is normal in structure. Trivial mitral valve regurgitation. No evidence of mitral valve stenosis. Tricuspid Valve: The tricuspid valve is normal in structure. Tricuspid valve regurgitation is trivial. No evidence of tricuspid stenosis. Aortic Valve: The aortic valve is tricuspid. Aortic valve regurgitation is trivial. No aortic stenosis is present. Aortic valve peak gradient measures 5.6 mmHg. Pulmonic Valve: The pulmonic valve was normal in structure. Pulmonic valve regurgitation is trivial. No evidence of pulmonic stenosis. Aorta: The aortic root is normal in size and structure. Venous: The inferior vena cava is normal in size with greater than 50% respiratory variability, suggesting right atrial pressure of 3 mmHg. IAS/Shunts: The interatrial septum was not well visualized.  LEFT VENTRICLE PLAX 2D LVIDd:         4.20 cm         Diastology LVIDs:         2.65 cm         LV e' medial:    10.80 cm/s LV PW:         0.60 cm         LV E/e' medial:  8.6 LV IVS:        0.80 cm         LV e' lateral:   15.90 cm/s LVOT diam:     2.00 cm         LV E/e' lateral: 5.9 LV SV:         52 LV SV Index:   28 LVOT Area:     3.14 cm        3D Volume EF                                LV  3D EF:    Left                                             ventricul                                             ar                                             ejection                                             fraction                                             by 3D                                             volume is                                             67 %.                                 3D Volume EF:                                 3D EF:        67 %                                LV EDV:       127 ml                                LV ESV:       42 ml                                LV SV:        85 ml RIGHT VENTRICLE             IVC RV S prime:     10.40 cm/s  IVC diam: 1.50 cm TAPSE (M-mode): 1.8 cm LEFT ATRIUM             Index        RIGHT ATRIUM          Index LA diam:  3.40 cm 1.84 cm/m   RA Area:     9.08 cm LA Vol (A2C):   43.0 ml 23.32 ml/m  RA Volume:   15.00 ml 8.13 ml/m LA Vol (A4C):   32.1 ml 17.41 ml/m LA Biplane Vol: 37.1 ml 20.12 ml/m  AORTIC VALVE AV Area (Vmax): 2.38 cm AV Vmax:        118.50 cm/s AV Peak Grad:   5.6 mmHg LVOT Vmax:      89.60 cm/s LVOT Vmean:     55.500 cm/s LVOT VTI:       0.165 m  AORTA Ao Root diam: 3.10 cm Ao Asc diam:  3.40 cm MITRAL VALVE               TRICUSPID VALVE MV Area (PHT): 4.89 cm    TR Peak grad:   21.0 mmHg MV Decel Time: 155 msec    TR Vmax:        229.00 cm/s MV E velocity: 93.30 cm/s MV A velocity: 82.30 cm/s  SHUNTS MV E/A ratio:  1.13        Systemic VTI:  0.16 m                            Systemic Diam: 2.00 cm Weston Brass MD Electronically signed by Weston Brass MD Signature Date/Time: 07/28/2023/5:22:02 PM    Final    CT Angio Chest/Abd/Pel for Dissection W and/or Wo Contrast Result Date: 07/28/2023 CLINICAL DATA:  Chest pain. Concern for acute aortic syndrome and pulmonary embolism. EXAM: CT ANGIOGRAPHY CHEST, ABDOMEN AND PELVIS TECHNIQUE: Non-contrast CT of the chest was initially obtained. Multidetector CT imaging through the chest, abdomen and pelvis was performed using the standard protocol during bolus administration of intravenous contrast. Multiplanar reconstructed images and MIPs were obtained and reviewed to evaluate the vascular anatomy. RADIATION DOSE REDUCTION: This exam was performed according to the departmental dose-optimization program which includes automated exposure control, adjustment of the mA and/or kV according to patient size and/or  use of iterative reconstruction technique. CONTRAST:  80mL OMNIPAQUE IOHEXOL 350 MG/ML SOLN COMPARISON:  Same day chest radiograph and CT 12/02/2020 FINDINGS: CTA CHEST FINDINGS Cardiovascular: Normal heart size. No pericardial effusion. Negative for acute pulmonary embolism. Normal caliber thoracic aorta without intramural hematoma, penetrating atherosclerotic ulcer, or dissection. Mediastinum/Nodes: Trachea and esophagus are unremarkable. No lymphadenopathy. Lungs/Pleura: Lungs are clear. No pleural effusion or pneumothorax. Musculoskeletal: No acute fracture Review of the MIP images confirms the above findings. CTA ABDOMEN AND PELVIS FINDINGS VASCULAR The aorta and its mesenteric, renal, and iliac artery branches are widely patent without aneurysm or dissection. Review of the MIP images confirms the above findings. NON-VASCULAR Hepatobiliary: No acute abnormality. Pancreas: Unremarkable. Spleen: Unremarkable. Adrenals/Urinary Tract: Normal adrenal glands and kidneys. No urinary calculi or hydronephrosis. Stomach/Bowel: Normal caliber large and small bowel. No bowel wall thickening. Appendix and stomach are within normal limits. Lymphatic: No lymphadenopathy. Reproductive: Heterogenous 15.1 x 13.8 x 14.0 cm mass likely originating from the uterine wall with central necrosis. The ovaries are not well visualized. Other: No free intraperitoneal fluid or air. Musculoskeletal: No acute fracture. Review of the MIP images confirms the above findings. IMPRESSION: 1. No acute aortic syndrome. Negative for acute pulmonary embolism. 2. Large 15.1 cm mass likely originating from the uterine wall with central necrosis. This is favored to represent a degenerating fibroid. Recommend gynecologic consultation. Aortic Atherosclerosis (ICD10-I70.0). Electronically Signed   By: Angelique Holm.D.  On: 07/28/2023 01:48   DG Chest 2 View Result Date: 07/27/2023 CLINICAL DATA:  Chest pain and indigestion EXAM: CHEST - 2 VIEW  COMPARISON:  11/18/2020 FINDINGS: The heart size and mediastinal contours are within normal limits. Both lungs are clear. The visualized skeletal structures are unremarkable. IMPRESSION: No active cardiopulmonary disease. Electronically Signed   By: Minerva Fester M.D.   On: 07/27/2023 20:32    Microbiology: Results for orders placed or performed during the hospital encounter of 05/21/07  Urine culture     Status: None   Collection Time: 05/21/07  9:44 PM   Specimen: Urine, Clean Catch  Result Value Ref Range Status   Specimen Description URINE, CLEAN CATCH  Final   Special Requests  clean catch  Final   Colony Count NO GROWTH  Final   Culture NO GROWTH  Final   Report Status 05/23/2007 FINAL  Final    Labs: CBC: Recent Labs  Lab 07/27/23 1710 07/28/23 0508  WBC 6.4 6.7  NEUTROABS 3.3  --   HGB 10.9* 10.9*  HCT 33.8* 33.6*  MCV 87.1 85.7  PLT 280 284   Basic Metabolic Panel: Recent Labs  Lab 07/27/23 1710 07/28/23 0508  NA 136  --   K 3.5  --   CL 103  --   CO2 23  --   GLUCOSE 124*  --   BUN 9  --   CREATININE 0.87 0.94  CALCIUM 8.9  --    Liver Function Tests: Recent Labs  Lab 07/27/23 1710  AST 19  ALT 13  ALKPHOS 37*  BILITOT 0.4  PROT 6.7  ALBUMIN 3.4*   CBG: No results for input(s): "GLUCAP" in the last 168 hours.  Discharge time spent: less than 30 minutes.  Signed: Thad Ranger, MD [facility] 07/28/2023

## 2023-07-28 NOTE — Discharge Planning (Signed)
TOC following for disposition needs.

## 2023-07-28 NOTE — Progress Notes (Signed)
Cardiology  Called to evaluate abnormal EKG. Pt had initial normal tracing; repeat had some changes apparent in several leads; these changes are due to improper lead placement, not due to any acute ischemic change. Recommended repeat EKG with proper lead placement to document no change from initial tracing.  Precious Reel, MD , [facility] 07/28/23 2:38 AM

## 2023-07-28 NOTE — Progress Notes (Addendum)
Patient seen and examined, admitted by Dr. Maisie Fus this morning.  Briefly 47 year old female with HTN, HLP, GERD, uterine fibroid presented with severe chest pain, bandlike while she was sitting at her desk at work with diaphoresis, nausea.  No shortness of breath.  Prior to this was in normal state of health.  It lasted around 30 minutes, off and on twinges like chest pain in last 3 months.  Has history of gastritis.  Also noted her symptoms improved after burping twice.  BP 97/61   Pulse 71   Temp 98.1 F (36.7 C) (Oral)   Resp 16   SpO2 100%   Physical Exam General: Alert and oriented x 3, NAD Cardiovascular: S1 S2 clear, RRR.  Respiratory: CTAB, no wheezing, rales or rhonchi Gastrointestinal: Soft, nontender, nondistended, NBS Ext: no pedal edema bilaterally Neuro: no new deficits Psych: Normal affect  Labs reviewed Troponin 16-> 118-> 125-> 110 EKG no acute ST-T wave changes suggestive of ischemia CTA negative for acute PE or dissection  Atypical chest pain -Has story of HTN, hyperlipidemia, no prior cardiac workup -Also has history of gastritis, possibility of chest pain with GI origin -Takes Protonix 40 mg daily. -Follow 2D echo, cardiology consulted  Gastritis/GERD -Takes Protonix 40 mg daily, will increase to twice daily for a month -She has appointment with [facility] gastroenterology next month for colonoscopy.  Recommended to discuss with GI for possible EGD   HTN -Resume Bystolic   Hyperlipidemia -Lipid panel pending  Uterine fibroid -CTA chest abdomen pelvis showed large 15.1 cm mass originating from the uterine wall with central necrosis favored to present to degenerating fibroid -Discussed in detail with the patient, she is aware of the uterine fibroid and has been following closely with GYN outpatient.  Plan discussed with patient and son at the bedside.  Will follow cardiology recommendations.   Thad Ranger M.D.  [facility] 07/28/2023, 9:09  AM

## 2023-07-28 NOTE — H&P (Signed)
History and Physical    Felicia Johnson:[id number] DOB: 1976-06-06 DOA: 07/27/2023  PCP: Etta Grandchild, MD  Patient coming from: work  I have personally briefly reviewed patient's old medical records in [facility]  Chief Complaint: band like chest pain   HPI: Felicia Johnson is a 47 y.o. female with medical history significant of  Hypertension, HLD, GERD on protonix , degenerating fibroid, who presents to ED from work s/p on set of band like chest pain while she was sitting at her desk. She states prior to this she was in her normal state of health. She notes she did eat prior to onset of symptoms.  She states due to persistent of he symptoms she has her blood pressure take at work.  She states her blood pressure was within normal limits. 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 She states during the evaluation she become very diaphoretic and with that she was referred to ED. Per patient she states chest pain was not associated with sob/ n/v and lasted around 30 minutes prior to resolution after burping twice. She states since then symptoms have not recurred. She notes no prior episode like this in the past.  ED Course:  Afeb, bp 116/68, hr 70, rr 17, sat 99% on ra  EKG: NSR Wbc 6.4, hgb 10.9, plt 280 Na 136, K3.5, CL 103, Glu124, cr 0.87 Lipase CE 16,118, 125,110 CTA abd/chest 1. No acute aortic syndrome. Negative for acute pulmonary embolism. 2. Large 15.1 cm mass likely originating from the uterine wall with central necrosis. This is favored to represent a degenerating fibroid. Recommend gynecologic consultation. Tx [id number]  Review of Systems: As per HPI otherwise 10 point review of systems negative.   Past Medical History:  Diagnosis Date   Acid reflux    Anemia    Hyperlipidemia     Past Surgical History:  Procedure Laterality Date   TUBAL LIGATION       reports that she has never smoked. She has never been  exposed to tobacco smoke. She has never used smokeless tobacco. She reports current alcohol use. She reports that she does not use drugs.  Allergies  Allergen Reactions   Latex Itching    Family History  Problem Relation Age of Onset   Hypertension Mother    Hypertension Sister    Anemia Sister    Atrial fibrillation Maternal Grandfather    Alzheimer's disease Paternal Grandmother    Stroke Other    Cancer Neg Hx    Heart disease Neg Hx    Hyperlipidemia Neg Hx    Diabetes Neg Hx    Drug abuse Neg Hx    Alcohol abuse Neg Hx    Kidney disease Neg Hx     Prior to Admission medications   Medication Sig Start Date End Date Taking? Authorizing Provider  Cholecalciferol 50 MCG (2000 UT) TABS Take 1 tablet (2,000 Units total) by mouth daily. 12/26/19  Yes Etta Grandchild, MD  fluticasone (FLONASE) 50 MCG/ACT nasal spray Place 2 sprays into both nostrils daily. 02/25/23 02/25/24 Yes Etta Grandchild, MD  levocetirizine (XYZAL) 5 MG tablet Take 1 tablet (5 mg total) by mouth every evening. 05/04/23  Yes Etta Grandchild, MD  Misc Natural Products (BEET ROOT) 500 MG CAPS Take 500 mg by mouth daily.   Yes [provider]  montelukast (SINGULAIR) 10 MG tablet Take 1 tablet (10 mg  total) by mouth at bedtime. 05/06/23  Yes Etta Grandchild, MD  nebivolol (BYSTOLIC) 10 MG tablet Take 1 tablet (10 mg total) by mouth daily. 02/24/23  Yes Etta Grandchild, MD  pantoprazole (PROTONIX) 40 MG tablet Take 1 tablet (40 mg total) by mouth daily. 07/21/23  Yes Etta Grandchild, MD  thiamine (VITAMIN B-1) 50 MG tablet Take 1 tablet by mouth daily. 02/20/22  Yes Etta Grandchild, MD  tranexamic acid (LYSTEDA) 650 MG TABS tablet Take 2 tablets by mouth 3 times a day for 5 days. Patient not taking: Reported on 07/28/2023 04/14/23       Physical Exam: Vitals:   07/28/23 0230 07/28/23 0300 07/28/23 0400 07/28/23 0404  BP: 111/70 111/68 111/67   Pulse: 80 75 65   Resp: (!) 30 14 15    Temp:    98.1 F (36.7 C)   TempSrc:    Oral  SpO2: 99% 100% 100%     Constitutional: NAD, calm, comfortable Vitals:   07/28/23 0230 07/28/23 0300 07/28/23 0400 07/28/23 0404  BP: 111/70 111/68 111/67   Pulse: 80 75 65   Resp: (!) 30 14 15    Temp:    98.1 F (36.7 C)  TempSrc:    Oral  SpO2: 99% 100% 100%    Eyes: PERRL, lids and conjunctivae normal ENMT: Mucous membranes are moist. Posterior pharynx clear of any exudate or lesions.Normal dentition.  Neck: normal, supple, no masses, no thyromegaly Respiratory: clear to auscultation bilaterally, no wheezing, no crackles. Normal respiratory effort. No accessory muscle use.  Cardiovascular: Regular rate and rhythm, no murmurs / rubs / gallops. No extremity edema. 2+ pedal pulses.  Abdomen: no tenderness, no masses palpated. No hepatosplenomegaly. Bowel sounds positive.  Musculoskeletal: no clubbing / cyanosis. No joint deformity upper and lower extremities. Good ROM, no contractures. Normal muscle tone.  Skin: no rashes, lesions, ulcers. No induration Neurologic: CN 2-12 grossly intact. Sensation intact, Strength 5/5 in all 4.  Psychiatric: Normal judgment and insight. Alert and oriented x 3. Normal mood.    Labs on Admission: I have personally reviewed following labs and imaging studies  CBC: Recent Labs  Lab 07/27/23 1710  WBC 6.4  NEUTROABS 3.3  HGB 10.9*  HCT 33.8*  MCV 87.1  PLT 280   Basic Metabolic Panel: Recent Labs  Lab 07/27/23 1710  NA 136  K 3.5  CL 103  CO2 23  GLUCOSE 124*  BUN 9  CREATININE 0.87  CALCIUM 8.9   GFR: CrCl cannot be calculated (Unknown ideal weight.). Liver Function Tests: Recent Labs  Lab 07/27/23 1710  AST 19  ALT 13  ALKPHOS 37*  BILITOT 0.4  PROT 6.7  ALBUMIN 3.4*   Recent Labs  Lab 07/27/23 1710  LIPASE 26   No results for input(s): "AMMONIA" in the last 168 hours. Coagulation Profile: No results for input(s): "INR", "PROTIME" in the last 168 hours. Cardiac Enzymes: No results for  input(s): "CKTOTAL", "CKMB", "CKMBINDEX", "TROPONINI" in the last 168 hours. BNP (last 3 results) No results for input(s): "PROBNP" in the last 8760 hours. HbA1C: No results for input(s): "HGBA1C" in the last 72 hours. CBG: No results for input(s): "GLUCAP" in the last 168 hours. Lipid Profile: No results for input(s): "CHOL", "HDL", "LDLCALC", "TRIG", "CHOLHDL", "LDLDIRECT" in the last 72 hours. Thyroid Function Tests: No results for input(s): "TSH", "T4TOTAL", "FREET4", "T3FREE", "THYROIDAB" in the last 72 hours. Anemia Panel: No results for input(s): "VITAMINB12", "FOLATE", "FERRITIN", "TIBC", "IRON", "RETICCTPCT" in the last  72 hours. Urine analysis:    Component Value Date/Time   COLORURINE YELLOW 07/27/2023 2018   APPEARANCEUR HAZY (A) 07/27/2023 2018   LABSPEC 1.008 07/27/2023 2018   PHURINE 6.0 07/27/2023 2018   GLUCOSEU NEGATIVE 07/27/2023 2018   GLUCOSEU NEGATIVE 03/03/2023 0855   HGBUR SMALL (A) 07/27/2023 2018   BILIRUBINUR NEGATIVE 07/27/2023 2018   KETONESUR 5 (A) 07/27/2023 2018   PROTEINUR NEGATIVE 07/27/2023 2018   UROBILINOGEN 0.2 03/03/2023 0855   NITRITE NEGATIVE 07/27/2023 2018   LEUKOCYTESUR NEGATIVE 07/27/2023 2018    Radiological Exams on Admission: CT Angio Chest/Abd/Pel for Dissection W and/or Wo Contrast Result Date: 07/28/2023 CLINICAL DATA:  Chest pain. Concern for acute aortic syndrome and pulmonary embolism. EXAM: CT ANGIOGRAPHY CHEST, ABDOMEN AND PELVIS TECHNIQUE: Non-contrast CT of the chest was initially obtained. Multidetector CT imaging through the chest, abdomen and pelvis was performed using the standard protocol during bolus administration of intravenous contrast. Multiplanar reconstructed images and MIPs were obtained and reviewed to evaluate the vascular anatomy. RADIATION DOSE REDUCTION: This exam was performed according to the departmental dose-optimization program which includes automated exposure control, adjustment of the mA and/or kV  according to patient size and/or use of iterative reconstruction technique. CONTRAST:  80mL OMNIPAQUE IOHEXOL 350 MG/ML SOLN COMPARISON:  Same day chest radiograph and CT 12/02/2020 FINDINGS: CTA CHEST FINDINGS Cardiovascular: Normal heart size. No pericardial effusion. Negative for acute pulmonary embolism. Normal caliber thoracic aorta without intramural hematoma, penetrating atherosclerotic ulcer, or dissection. Mediastinum/Nodes: Trachea and esophagus are unremarkable. No lymphadenopathy. Lungs/Pleura: Lungs are clear. No pleural effusion or pneumothorax. Musculoskeletal: No acute fracture Review of the MIP images confirms the above findings. CTA ABDOMEN AND PELVIS FINDINGS VASCULAR The aorta and its mesenteric, renal, and iliac artery branches are widely patent without aneurysm or dissection. Review of the MIP images confirms the above findings. NON-VASCULAR Hepatobiliary: No acute abnormality. Pancreas: Unremarkable. Spleen: Unremarkable. Adrenals/Urinary Tract: Normal adrenal glands and kidneys. No urinary calculi or hydronephrosis. Stomach/Bowel: Normal caliber large and small bowel. No bowel wall thickening. Appendix and stomach are within normal limits. Lymphatic: No lymphadenopathy. Reproductive: Heterogenous 15.1 x 13.8 x 14.0 cm mass likely originating from the uterine wall with central necrosis. The ovaries are not well visualized. Other: No free intraperitoneal fluid or air. Musculoskeletal: No acute fracture. Review of the MIP images confirms the above findings. IMPRESSION: 1. No acute aortic syndrome. Negative for acute pulmonary embolism. 2. Large 15.1 cm mass likely originating from the uterine wall with central necrosis. This is favored to represent a degenerating fibroid. Recommend gynecologic consultation. Aortic Atherosclerosis (ICD10-I70.0). Electronically Signed   By: Minerva Fester M.D.   On: 07/28/2023 01:48   DG Chest 2 View Result Date: 07/27/2023 CLINICAL DATA:  Chest pain and  indigestion EXAM: CHEST - 2 VIEW COMPARISON:  11/18/2020 FINDINGS: The heart size and mediastinal contours are within normal limits. Both lungs are clear. The visualized skeletal structures are unremarkable. IMPRESSION: No active cardiopulmonary disease. Electronically Signed   By: Minerva Fester M.D.   On: 07/27/2023 20:32    EKG: Independently reviewed. See above  Assessment/Plan   Chest pain r/o ACS  -noted abn CE with delta , however currently flat  -per cardiology consult not thought to be acute ACS -patient admitted for risk stratification -chest pain presumed to be probable GI in origin  - asa daily for now  -trend CE, Echo, stress test in am  - resume protonix, prn gi cocktail   Hypertension  -resume Bystolic  HLD -lipid  panel pending    GERD -ppi  DVT prophylaxis: heparin Code Status: full/ as discussed per patient wishes in event of cardiac arrest  Family Communication: son at bedside Disposition Plan: patient  expected to be admitted greater than 2 midnights  Consults called:called by EDP DR Alberteen Spindle  Admission status: cardiac tele   Lurline Del MD [facility]   If 7PM-7AM, please contact night-coverage [email or web address] Password [facility]  07/28/2023, 5:32 AM

## 2023-07-28 NOTE — Consult Note (Addendum)
Cardiology Consultation   Patient ID: WILHELMENA MARZULLO MRN: [id number]; DOB: 10-Apr-1976  Admit date: 07/27/2023 Date of Consult: 07/28/2023  PCP:  Etta Grandchild, MD   [facility] Providers Cardiologist:  None - New this admission    Patient Profile:   Felicia Johnson is a 47 y.o. female with a hx of HTN, HLD, GERD, degenerating uterine fibroid who is being seen 07/28/2023 for the evaluation of chest pain at the request of Dr. Isidoro Donning.    History of Present Illness:   Felicia Johnson is a 47 year old female with above medical history. Per chart review, does not appear that patient has any past cardiac history and does not follow with a cardiologist. Patient previously saw her PCP in 02/2021 for evaluation of back pain. Underwent CT scan that showed 3 fibroids. She is followed by ob-gyn for this.   Patient presented to the ED on 12/19 after she had an episode of upper back pressure and chest pressure. Reported that she belched and felt better. Initial vital signs in the ED showed BP 116/68, oxygen 99% on room air, HR 70 BPM. Initial EKG showed NSR with HR 72 BPM. Labs in the ED showed K 3.5, creatinine 0.87, WBC 6.4, hemoglobin 10.9, platelets 280. hsTn 16>118>125>110. CXR showed no active cardiopulmonary disease. CTA chest/abd/pelvis for dissection showed no acute aortic syndrome, no evidence of PE, large 15.1 cm mass originating from the  uterine wall with central necrosis, likely a degenerating fibroid.   On interview, patient denies any past cardiac history.  She does have a history of high blood pressure, but her blood pressure is well-controlled on her current medications.  Denies history of high cholesterol, diabetes.  Denies significant family history of CAD.  Denies personal tobacco use.  She works as a Lawyer for [employer].  Spends a lot of time on her feet, denies recent issues with chest pain, shortness of breath.  She does have a history of GERD. She has been in her usual  state of health recently without any illness. Yesterday, she was at work participating in a Journalist, newspaper.  Had a sudden onset of a bandlike tightness across her chest, upper back.  Tightness did not radiate.  Denied associated shortness of breath, nausea. She had her coworker check her BP, which was reportedly normal. Reports that she had a brief episode of diaphoresis, so one of the doctors she works with encouraged her to go to the ER.  On her way to the ER, patient burped twice and her symptoms completely resolved.  She has not had recurrence of chest pain/tightness since.   Past Medical History:  Diagnosis Date   Acid reflux    Anemia    Hyperlipidemia     Past Surgical History:  Procedure Laterality Date   TUBAL LIGATION       Home Medications:  Prior to Admission medications   Medication Sig Start Date End Date Taking? Authorizing Provider  Cholecalciferol 50 MCG (2000 UT) TABS Take 1 tablet (2,000 Units total) by mouth daily. 12/26/19  Yes Etta Grandchild, MD  fluticasone (FLONASE) 50 MCG/ACT nasal spray Place 2 sprays into both nostrils daily. 02/25/23 02/25/24 Yes Etta Grandchild, MD  levocetirizine (XYZAL) 5 MG tablet Take 1 tablet (5 mg total) by mouth every evening. 05/04/23  Yes Etta Grandchild, MD  Misc Natural Products (BEET ROOT) 500 MG CAPS Take 500 mg by mouth daily.   Yes [provider]  montelukast (  SINGULAIR) 10 MG tablet Take 1 tablet (10 mg total) by mouth at bedtime. 05/06/23  Yes Etta Grandchild, MD  nebivolol (BYSTOLIC) 10 MG tablet Take 1 tablet (10 mg total) by mouth daily. 02/24/23  Yes Etta Grandchild, MD  pantoprazole (PROTONIX) 40 MG tablet Take 1 tablet (40 mg total) by mouth daily. 07/21/23  Yes Etta Grandchild, MD  thiamine (VITAMIN B-1) 50 MG tablet Take 1 tablet by mouth daily. 02/20/22  Yes Etta Grandchild, MD  tranexamic acid (LYSTEDA) 650 MG TABS tablet Take 2 tablets by mouth 3 times a day for 5 days. 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[employer], [place]  Tobacco Use   Smoking status: Never    Passive exposure: Never   Smokeless tobacco: Never  Vaping Use   Vaping status: Never Used  Substance and Sexual Activity   Alcohol use: Yes    Comment: ocassional   Drug use: Never   Sexual activity: Yes    Partners: Male    Birth control/protection: Surgical  Other Topics Concern   Not on file  Social History Narrative   Regular Exercise -  NO   Caffiene; gingerale   Lives home with children (3) and husband   Social Drivers of Corporate investment banker Strain: Not on file  Food Insecurity: Not on file  Transportation Needs: Not on file  Physical Activity: Not on file  Stress: Not on file  Social Connections: Not on file  Intimate Partner Violence: Not on file    Family History:    Family History  Problem Relation Age of Onset   Hypertension Mother    Hypertension Sister    Anemia Sister    Atrial fibrillation Maternal Grandfather    Alzheimer's disease Paternal Grandmother    Stroke Other    Cancer Neg Hx    Heart disease Neg Hx    Hyperlipidemia Neg Hx    Diabetes Neg Hx    Drug abuse Neg Hx    Alcohol abuse Neg Hx    Kidney disease Neg Hx      ROS:  Please see the history of present illness.   All other ROS reviewed and negative.     Physical Exam/Data:   Vitals:   07/28/23 0400 07/28/23 0404 07/28/23 0600 07/28/23  0700  BP: 111/67  (!) 96/59 97/61  Pulse: 65  70 71  Resp: 15  14 16   Temp:  98.1 F (36.7 C)    TempSrc:  Oral    SpO2: 100%  100% 100%   No intake or output data in the 24 hours ending 07/28/23 0753    02/24/2023    9:14 AM 01/04/2023   12:42 PM 10/21/2022    9:30 AM  Last 3 Weights  Weight (lbs) 174  lb 174 lb 3.2 oz 170 lb  Weight (kg) 78.926 kg 79.017 kg 77.111 kg     There is no height or weight on file to calculate BMI.  General:  Well nourished, well developed, in no acute distress. Sitting comfortably in the bed  HEENT: normal Neck: no JVD Vascular: Radial pulses 2+ bilaterally Cardiac:  normal S1, S2; RRR; no murmur   Lungs:  clear to auscultation bilaterally, no wheezing, rhonchi or rales. Normal work of breathing on room air  Abd: soft, nontender  Ext: no edema in BLE Musculoskeletal:  No deformities, BUE and BLE strength normal and equal Skin: warm and dry  Neuro:  no focal abnormalities noted Psych:  Normal affect   EKG:  The EKG was personally reviewed and demonstrates:  Initial EKG showed NSR with HR 72 BPM Telemetry:  Telemetry was personally reviewed and demonstrates:  NSR  Relevant CV Studies:   Laboratory Data:  High Sensitivity Troponin:   Recent Labs  Lab 07/27/23 1710 07/27/23 1951 07/27/23 2225 07/28/23 0037  TROPONINIHS 16 118* 125* 110*     Chemistry Recent Labs  Lab 07/27/23 1710 07/28/23 0508  NA 136  --   K 3.5  --   CL 103  --   CO2 23  --   GLUCOSE 124*  --   BUN 9  --   CREATININE 0.87 0.94  CALCIUM 8.9  --   GFRNONAA >60 >60  ANIONGAP 10  --     Recent Labs  Lab 07/27/23 1710  PROT 6.7  ALBUMIN 3.4*  AST 19  ALT 13  ALKPHOS 37*  BILITOT 0.4   Lipids No results for input(s): "CHOL", "TRIG", "HDL", "LABVLDL", "LDLCALC", "CHOLHDL" in the last 168 hours.  Hematology Recent Labs  Lab 07/27/23 1710 07/28/23 0508  WBC 6.4 6.7  RBC 3.88 3.92  HGB 10.9* 10.9*  HCT 33.8* 33.6*  MCV 87.1 85.7  MCH 28.1 27.8   MCHC 32.2 32.4  RDW 15.7* 15.7*  PLT 280 284   Thyroid No results for input(s): "TSH", "FREET4" in the last 168 hours.  BNPNo results for input(s): "BNP", "PROBNP" in the last 168 hours.  DDimer No results for input(s): "DDIMER" in the last 168 hours.   Radiology/Studies:  CT Angio Chest/Abd/Pel for Dissection W and/or Wo Contrast Result Date: 07/28/2023 CLINICAL DATA:  Chest pain. Concern for acute aortic syndrome and pulmonary embolism. EXAM: CT ANGIOGRAPHY CHEST, ABDOMEN AND PELVIS TECHNIQUE: Non-contrast CT of the chest was initially obtained. Multidetector CT imaging through the chest, abdomen and pelvis was performed using the standard protocol during bolus administration of intravenous contrast. Multiplanar reconstructed images and MIPs were obtained and reviewed to evaluate the vascular anatomy. RADIATION DOSE REDUCTION: This exam was performed according to the departmental dose-optimization program which includes automated exposure control, adjustment of the mA and/or kV according to patient size and/or use of iterative reconstruction technique. CONTRAST:  80mL OMNIPAQUE IOHEXOL 350 MG/ML SOLN COMPARISON:  Same day chest radiograph and CT 12/02/2020 FINDINGS: CTA CHEST FINDINGS Cardiovascular: Normal heart size. No pericardial effusion. Negative for acute pulmonary embolism. Normal caliber thoracic aorta without intramural hematoma, penetrating atherosclerotic ulcer, or dissection. Mediastinum/Nodes: Trachea and esophagus are unremarkable. No lymphadenopathy. Lungs/Pleura: Lungs are clear. No pleural effusion or pneumothorax. Musculoskeletal: No acute fracture Review of the MIP images confirms the above findings. CTA ABDOMEN AND PELVIS FINDINGS VASCULAR The aorta and its mesenteric, renal, and iliac artery branches are widely patent without aneurysm or dissection. Review of the MIP images  confirms the above findings. NON-VASCULAR Hepatobiliary: No acute abnormality. Pancreas: Unremarkable.  Spleen: Unremarkable. Adrenals/Urinary Tract: Normal adrenal glands and kidneys. No urinary calculi or hydronephrosis. Stomach/Bowel: Normal caliber large and small bowel. No bowel wall thickening. Appendix and stomach are within normal limits. Lymphatic: No lymphadenopathy. Reproductive: Heterogenous 15.1 x 13.8 x 14.0 cm mass likely originating from the uterine wall with central necrosis. The ovaries are not well visualized. Other: No free intraperitoneal fluid or air. Musculoskeletal: No acute fracture. Review of the MIP images confirms the above findings. IMPRESSION: 1. No acute aortic syndrome. Negative for acute pulmonary embolism. 2. Large 15.1 cm mass likely originating from the uterine wall with central necrosis. This is favored to represent a degenerating fibroid. Recommend gynecologic consultation. Aortic Atherosclerosis (ICD10-I70.0). Electronically Signed   By: Minerva Fester M.D.   On: 07/28/2023 01:48   DG Chest 2 View Result Date: 07/27/2023 CLINICAL DATA:  Chest pain and indigestion EXAM: CHEST - 2 VIEW COMPARISON:  11/18/2020 FINDINGS: The heart size and mediastinal contours are within normal limits. Both lungs are clear. The visualized skeletal structures are unremarkable. IMPRESSION: No active cardiopulmonary disease. Electronically Signed   By: Minerva Fester M.D.   On: 07/27/2023 20:32     Assessment and Plan:   Chest pain  Elevated Troponins  -Patient is a 47 year old female without past cardiac history.  Denies recent chest pain, shortness of breath on exertion.  Has been in her usual state of health.  Has history of hypertension that is well-controlled on medications.  Denies personal tobacco use, family history of CAD, hyperlipidemia, diabetes -She presented to the ED yesterday after having an episode of "bandlike tightness" around her chest, upper back.  Associated with a brief episode of diaphoresis.  Occurred while she was at rest.  Micah Flesher away after she burped twice -EKG  nonischemic -High-sensitivity troponin 16, 118, 125, 110 -Overall, chest pain is atypical.  Troponin mildly elevated and flat, not consistent with ACS.  CTA chest showed no acute aortic syndrome, no acute PE. - As symptoms resolved with burping, possible GI etiology. Primary team increasing her protonix. Could also be related to her large uterine fibroid (measuring 15 cm)  -Echocardiogram ordered and pending. If EF normal and there are no RWMA, would be OK to discharge today - Plan for outpatient coronary CTA. Patient is in agreement with outpatient workup  - Patient has aortic atherosclerosis on CT scan- start asa 81 mg daily and statin therapy   Otherwise per primary -GERD - Uterine fibroid - CTA showed a large, 15.1 cm mass originating from the uterine wall with central necrosis, suspected degenerating fibroid.  Patient reports she is followed by gyn as an outpatient for this.   Risk Assessment/Risk Scores:        For questions or updates, please contact [facility] Please consult [email or web address] for contact info under    Signed, Jonita Albee, PA-C  07/28/2023 7:53 AM   ATTENDING ATTESTATION:  After conducting a review of all available clinical information with the care team, interviewing the patient, and performing a physical exam, I agree with the findings and plan described in this note.   GEN: No acute distress.   HEENT:  MMM, no JVD, no scleral icterus Cardiac: RRR, no murmurs, rubs, or gallops.  Respiratory: Clear to auscultation bilaterally. GI: Soft, low central abdominal firmness MS: No edema; No deformity. Neuro:  Nonfocal  Vasc:  +2 radial pulses  Patient is a 47 year old African-American female with a history of  hypertension, hyperlipidemia GERD, and a large degenerating uterine fibroid who presents with atypical chest pain.  The patient was in her normal state of health up until yesterday.  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  She was at  a gift exchange when she developed sudden onset bandlike chest discomfort.  This was then followed by diaphoresis.  Because of the symptoms she came to the emergency department.  Her evaluation here has been reassuring with mildly elevated high-sensitivity troponins and a nonischemic EKG.  She has been completely symptom-free's after burping.  She does have a history of GERD.  She thinks she might have derived symptoms after eating some salty chips for lunch.  Will obtain an echocardiogram.  If this is unremarkable then I think the patient can be discharged on aspirin and atorvastatin due to the presence of aortic atherosclerosis on a prior CT scan.  We will then arrange for cardiology follow-up and a coronary CTA.  If her echocardiogram is abnormal then we will recommend coronary angiography to evaluate further.  I discussed with the patient and her family and they are in agreement with the plan.  Alverda Skeans, MD Pager [phone]

## 2023-07-28 NOTE — Progress Notes (Signed)
Echocardiogram 2D Echocardiogram has been performed.  Felicia Johnson 07/28/2023, 5:03 PM

## 2023-07-29 ENCOUNTER — Other Ambulatory Visit (HOSPITAL_COMMUNITY): Payer: Self-pay

## 2023-07-31 ENCOUNTER — Other Ambulatory Visit (HOSPITAL_COMMUNITY): Payer: Self-pay

## 2023-07-31 ENCOUNTER — Telehealth: Payer: Self-pay | Admitting: Internal Medicine

## 2023-07-31 DIAGNOSIS — K2100 Gastro-esophageal reflux disease with esophagitis, without bleeding: Secondary | ICD-10-CM

## 2023-07-31 MED ORDER — PANTOPRAZOLE SODIUM 40 MG PO TBEC
40.0000 mg | DELAYED_RELEASE_TABLET | Freq: Two times a day (BID) | ORAL | 3 refills | Status: DC
  Filled 2023-07-31: qty 90, 45d supply, fill #0
  Filled 2023-08-08: qty 90, 60d supply, fill #0
  Filled 2023-10-06: qty 90, 90d supply, fill #1
  Filled 2023-10-06: qty 90, 60d supply, fill #1
  Filled 2023-12-14: qty 90, 60d supply, fill #2
  Filled 2024-03-19: qty 90, 60d supply, fill #3

## 2023-07-31 NOTE — Telephone Encounter (Signed)
Sent prescription for protonix    Noreen Mackintosh M.D.  [facility] 07/31/2023, 10:41 AM

## 2023-08-08 ENCOUNTER — Other Ambulatory Visit: Payer: Self-pay

## 2023-08-08 ENCOUNTER — Other Ambulatory Visit: Payer: Self-pay | Admitting: Internal Medicine

## 2023-08-08 ENCOUNTER — Other Ambulatory Visit (HOSPITAL_COMMUNITY): Payer: Self-pay

## 2023-08-08 DIAGNOSIS — J301 Allergic rhinitis due to pollen: Secondary | ICD-10-CM

## 2023-08-09 NOTE — Progress Notes (Signed)
 Cardiology Office Note    Date:  08/10/2023  ID:  Felicia Johnson, DOB Jun 07, 1976, MRN [id number] PCP:  Felicia Debby CROME, MD  Cardiologist:  Felicia MARLA Red, MD  Electrophysiologist:  None   Chief Complaint: ED follow-up for chest pain  History of Present Illness: .    Felicia Johnson is a 48 y.o. female with visit-pertinent history of hypertension, hyperlipidemia, GERD, degenerating uterine fibroid.  On 07/27/2023 she presented to the ED with an episode of upper back pressure and chest pressure.  Patient reported that she belched and felt better.  Her initial evaluation in the ED indicated blood pressure 116/68, oxygen 99% on room air, heart rate 70 bpm.  Her EKG showed normal sinus rhythm with a heart rate of 72 bpm.  High sensitive troponin 16>118>125>110.  Her chest x-ray showed no active cardiopulmonary disease, CTA of the chest/abdomen/pelvis showed no acute aortic syndrome, no evidence of PE, large 15.1 cm mass originating from the uterine wall with central necrosis, likely a degenerating fibroid.  On 12/19 while at work she had a sudden onset of a bandlike tightness across her chest and upper back, tightness did not radiate, she denied any associated shortness of breath or nausea.  A coworker checked her blood pressure which was reportedly normal.  She noted that she had a brief episode of diaphoresis.  On the way to the ER patient burped twice and her symptoms completely resolved.  She denied any recurrence of chest pain or tightness while at the hospital.  She denied any chest pain or shortness of breath prior to episode.  Patient was seen by Dr. Red, recommended outpatient coronary CTA.  Echocardiogram on 07/28/2023 indicated LVEF of 60 to 65%, no RWMA, diastolic parameters were normal, there were no significant valvular abnormalities.  Today she presents for follow-up.  She reports that she is doing well. She had one episode of upper/mid back pain after she was discharged from the  hospital with some associated diaphoresis, this did not radiate around to her chest and was not associated with exertion. She took an additional Protonix  with improvement in her symptoms, she feels it was gas pain. Notes she had eaten some BBQ chips prior to discomfort. She has not had any further chest or back pain.  She denies any shortness of breath or chest pain with exertion.  She denies any lower extremity edema.  ROS: .   Today she denies shortness of breath, lower extremity edema, fatigue, palpitations, melena, hematuria, hemoptysis, diaphoresis, weakness, presyncope, syncope, orthopnea, and PND.  All other systems are reviewed and otherwise negative. Studies Reviewed: Felicia Johnson    EKG:  EKG is not ordered today.   CV Studies:  Cardiac Studies & Procedures      ECHOCARDIOGRAM  ECHOCARDIOGRAM COMPLETE 07/28/2023  Narrative ECHOCARDIOGRAM REPORT    [name] Date of Exam: 07/28/2023 Medical Rec #:  [id number]     Height:       64.0 in Accession #:    [id number]    Weight:       174.0 lb Date of Birth:  August 18, 1975     BSA:          1.844 m Patient Age:    47 years      BP:           111/53 mmHg Patient Gender: F             HR:  73 bpm. Exam Location:  Inpatient  Procedure: Cardiac Doppler, Color Doppler, 3D Echo and 2D Echo  Indications:    Chest Pain R07.9  History:        Patient has no prior history of Echocardiogram examinations. Arrythmias:Tachycardia, Signs/Symptoms:Chest Pain; Risk Factors:Hypertension, Sleep Apnea and Dyslipidemia.  Sonographer:    Felicia Johnson RCS Referring Phys: [id number] Felicia Johnson  IMPRESSIONS   1. Left ventricular ejection fraction, by estimation, is 60 to 65%. Left ventricular ejection fraction by 3D volume is 67 %. The left ventricle has normal function. The left ventricle has no regional wall motion abnormalities. Left ventricular diastolic parameters were normal. 2. Right ventricular systolic function  is normal. The right ventricular size is normal. There is normal pulmonary artery systolic pressure. The estimated right ventricular systolic pressure is 24.0 mmHg. 3. The mitral valve is normal in structure. Trivial mitral valve regurgitation. No evidence of mitral stenosis. 4. The aortic valve is tricuspid. Aortic valve regurgitation is trivial. No aortic stenosis is present. 5. The inferior vena cava is normal in size with greater than 50% respiratory variability, suggesting right atrial pressure of 3 mmHg.  FINDINGS Left Ventricle: Left ventricular ejection fraction, by estimation, is 60 to 65%. Left ventricular ejection fraction by 3D volume is 67 %. The left ventricle has normal function. The left ventricle has no regional wall motion abnormalities. The left ventricular internal cavity size was normal in size. There is no left ventricular hypertrophy. Left ventricular diastolic parameters were normal.  Right Ventricle: The right ventricular size is normal. No increase in right ventricular wall thickness. Right ventricular systolic function is normal. There is normal pulmonary artery systolic pressure. The tricuspid regurgitant velocity is 2.29 m/s, and with an assumed right atrial pressure of 3 mmHg, the estimated right ventricular systolic pressure is 24.0 mmHg.  Left Atrium: Left atrial size was normal in size.  Right Atrium: Right atrial size was normal in size.  Pericardium: There is no evidence of pericardial effusion.  Mitral Valve: The mitral valve is normal in structure. Trivial mitral valve regurgitation. No evidence of mitral valve stenosis.  Tricuspid Valve: The tricuspid valve is normal in structure. Tricuspid valve regurgitation is trivial. No evidence of tricuspid stenosis.  Aortic Valve: The aortic valve is tricuspid. Aortic valve regurgitation is trivial. No aortic stenosis is present. Aortic valve peak gradient measures 5.6 mmHg.  Pulmonic Valve: The pulmonic valve was  normal in structure. Pulmonic valve regurgitation is trivial. No evidence of pulmonic stenosis.  Aorta: The aortic root is normal in size and structure.  Venous: The inferior vena cava is normal in size with greater than 50% respiratory variability, suggesting right atrial pressure of 3 mmHg.  IAS/Shunts: The interatrial septum was not well visualized.   LEFT VENTRICLE PLAX 2D LVIDd:         4.20 cm         Diastology LVIDs:         2.65 cm         LV e' medial:    10.80 cm/s LV PW:         0.60 cm         LV E/e' medial:  8.6 LV IVS:        0.80 cm         LV e' lateral:   15.90 cm/s LVOT diam:     2.00 cm         LV E/e' lateral: 5.9 LV SV:  52 LV SV Index:   28 LVOT Area:     3.14 cm        3D Volume EF LV 3D EF:    Left ventricul ar ejection fraction by 3D volume is 67 %.  3D Volume EF: 3D EF:        67 % LV EDV:       127 ml LV ESV:       42 ml LV SV:        85 ml  RIGHT VENTRICLE             IVC RV S prime:     10.40 cm/s  IVC diam: 1.50 cm TAPSE (M-mode): 1.8 cm  LEFT ATRIUM             Index        RIGHT ATRIUM          Index LA diam:        3.40 cm 1.84 cm/m   RA Area:     9.08 cm LA Vol (A2C):   43.0 ml 23.32 ml/m  RA Volume:   15.00 ml 8.13 ml/m LA Vol (A4C):   32.1 ml 17.41 ml/m LA Biplane Vol: 37.1 ml 20.12 ml/m AORTIC VALVE AV Area (Vmax): 2.38 cm AV Vmax:        118.50 cm/s AV Peak Grad:   5.6 mmHg LVOT Vmax:      89.60 cm/s LVOT Vmean:     55.500 cm/s LVOT VTI:       0.165 m  AORTA Ao Root diam: 3.10 cm Ao Asc diam:  3.40 cm  MITRAL VALVE               TRICUSPID VALVE MV Area (PHT): 4.89 cm    TR Peak grad:   21.0 mmHg MV Decel Time: 155 msec    TR Vmax:        229.00 cm/s MV E velocity: 93.30 cm/s MV A velocity: 82.30 cm/s  SHUNTS MV E/A ratio:  1.13        Systemic VTI:  0.16 m Systemic Diam: 2.00 cm  Soyla Merck MD Electronically signed by Soyla Merck MD Signature Date/Time: 07/28/2023/5:22:02  PM    Final   MONITORS  CARDIAC EVENT MONITOR 02/02/2021  Narrative Sinus rhythm with sinus tachycardia No atrial fibrillation Rare ventricular ectopy  Will Camnitz, MD            Current Reported Medications:.    Current Meds  Medication Sig   aspirin  EC 81 MG tablet Take 1 tablet (81 mg total) by mouth daily. Swallow whole.   atorvastatin  (LIPITOR) 40 MG tablet Take 1 tablet (40 mg total) by mouth at bedtime.   Cholecalciferol  50 MCG (2000 UT) TABS Take 1 tablet (2,000 Units total) by mouth daily.   fluticasone  (FLONASE ) 50 MCG/ACT nasal spray Place 2 sprays into both nostrils daily.   levocetirizine (XYZAL ) 5 MG tablet Take 1 tablet (5 mg total) by mouth every evening.   metoprolol  tartrate (LOPRESSOR ) 100 MG tablet Take 1 tablet 2 hours prior to Coronary CTA   Misc Natural Products (BEET ROOT) 500 MG CAPS Take 500 mg by mouth daily.   montelukast  (SINGULAIR ) 10 MG tablet Take 1 tablet (10 mg total) by mouth at bedtime.   nebivolol  (BYSTOLIC ) 10 MG tablet Take 1 tablet (10 mg total) by mouth daily.   pantoprazole  (PROTONIX ) 40 MG tablet Take 1 tablet (40 mg total) by mouth 2 (two) times daily  before a meal for 1 month.  Then reduce to 1 tablet (40mg ) by mouth once daily   thiamine  (VITAMIN B-1) 50 MG tablet Take 1 tablet by mouth daily.   Physical Exam:    VS:  BP 122/84   Pulse 81   Ht 5' 4 (1.626 m)   Wt 174 lb (78.9 kg)   SpO2 97%   BMI 29.87 kg/m    Wt Readings from Last 3 Encounters:  08/10/23 174 lb (78.9 kg)  02/24/23 174 lb (78.9 kg)  01/04/23 174 lb 3.2 oz (79 kg)    GEN: Well nourished, well developed in no acute distress NECK: No JVD; No carotid bruits CARDIAC: RRR, no murmurs, rubs, gallops RESPIRATORY:  Clear to auscultation without rales, wheezing or rhonchi  ABDOMEN: Soft, non-tender, non-distended EXTREMITIES:  No edema; No acute deformity   Asessement and Plan:.   Chest pain/elevated troponin: Patient presented to the ED on 07/27/2023 with  an episode of upper back and chest pressure.  She reported an initial feeling of a bandlike tightness around her chest and back, denied associated shortness of breath or nausea.  She did note a short episode of diaphoresis following.  On her way to the emergency room she burped twice with resolution in symptoms.  Her EKG was nonischemic, high sensitive troponin 16>118>125>110.  CT showed aortic atherosclerosis, she was started on aspirin  81 mg daily and statin therapy.  Echocardiogram on 07/28/2023 indicated LVEF of 60 to 65%, no RWMA, diastolic parameters were normal, there were no significant valvular abnormalities. Today she reports that she is doing well, she had 1 episode of upper back pain that felt similar to her prior pain however it did not radiate around to her chest.  She did note some associated diaphoresis but denies shortness of breath.  She reports that she took an additional Protonix  with improvement in her symptoms.  She feels that this was more so gas pain as she had eaten barbecue chips prior to event.  She denies any further episodes of chest pain associated with exertion.  Reviewed ED precautions. Check Coronary CTA, patient agreeable with this.  She will take 100 mg of metoprolol  tartrate prior to CT. Check BMET and HCG. Continue aspirin , Lipitor, nebivolol .  Hypertension: Blood pressure today 122/84. Continue nebivolol  10 mg daily. Monitored and managed per PCP.   Uterine fibroid: CTA during admission showed a large, 15.1 cm uterine mass originating from the uterine wall with central necrosis, suspected degenerating fibroid.  Patient is being followed by GYN.   Hyperlipidemia: Last lipid profile on 03/03/2023 indicated total cholesterol of 197, triglyceride 63, HDL 59.7 and LDL 125. Started on atorvastatin  40 mg daily during admission. Check fasting lipid profile and LFTs in 2 months.     Disposition: F/u with Jaevon Paras, NP in 6 weeks.   Signed, Mikenzie Mccannon D Atticus Wedin, NP

## 2023-08-10 ENCOUNTER — Other Ambulatory Visit (HOSPITAL_COMMUNITY): Payer: Self-pay

## 2023-08-10 ENCOUNTER — Encounter: Payer: Self-pay | Admitting: Cardiology

## 2023-08-10 ENCOUNTER — Ambulatory Visit: Payer: 59 | Attending: Cardiology | Admitting: Cardiology

## 2023-08-10 VITALS — BP 122/84 | HR 81 | Ht 64.0 in | Wt 174.0 lb

## 2023-08-10 DIAGNOSIS — I10 Essential (primary) hypertension: Secondary | ICD-10-CM

## 2023-08-10 DIAGNOSIS — R072 Precordial pain: Secondary | ICD-10-CM

## 2023-08-10 DIAGNOSIS — R079 Chest pain, unspecified: Secondary | ICD-10-CM

## 2023-08-10 DIAGNOSIS — I700 Atherosclerosis of aorta: Secondary | ICD-10-CM | POA: Diagnosis not present

## 2023-08-10 DIAGNOSIS — E782 Mixed hyperlipidemia: Secondary | ICD-10-CM | POA: Diagnosis not present

## 2023-08-10 LAB — BASIC METABOLIC PANEL
BUN/Creatinine Ratio: 11 (ref 9–23)
BUN: 10 mg/dL (ref 6–24)
CO2: 18 mmol/L — ABNORMAL LOW (ref 20–29)
Calcium: 8.8 mg/dL (ref 8.7–10.2)
Chloride: 106 mmol/L (ref 96–106)
Creatinine, Ser: 0.9 mg/dL (ref 0.57–1.00)
Glucose: 81 mg/dL (ref 70–99)
Potassium: 4.7 mmol/L (ref 3.5–5.2)
Sodium: 141 mmol/L (ref 134–144)
eGFR: 80 mL/min/{1.73_m2} (ref >59)

## 2023-08-10 LAB — HCG, SERUM, QUALITATIVE: hCG,Beta Subunit,Qual,Serum: NEGATIVE m[IU]/mL (ref <6)

## 2023-08-10 MED ORDER — MONTELUKAST SODIUM 10 MG PO TABS
10.0000 mg | ORAL_TABLET | Freq: Every day | ORAL | 0 refills | Status: DC
  Filled 2023-08-10: qty 90, 90d supply, fill #0

## 2023-08-10 MED ORDER — METOPROLOL TARTRATE 100 MG PO TABS
ORAL_TABLET | ORAL | 0 refills | Status: DC
  Filled 2023-08-10: qty 1, 1d supply, fill #0

## 2023-08-10 NOTE — Patient Instructions (Addendum)
 Medication Instructions:  Take Metoprolol  tartrate 100 mg once 2 hours prior to scan  *If you need a refill on your cardiac medications before your next appointment, please call your pharmacy*  Lab Work: Today we will draw Bmet and HCG In 2 months we will need fasting lipid panel and LFT If you have labs (blood work) drawn today and your tests are completely normal, you will receive your results only by: MyChart Message (if you have MyChart) OR A paper copy in the mail If you have any lab test that is abnormal or we need to change your treatment, we will call you to review the results.  Testing/Procedures:   Your cardiac CT will be scheduled at one of the below locations:   [facility] [address] [place], [place] [address] [phone]  If scheduled at [facility], please arrive at the [facility] and Children's Entrance (Entrance C2) of [facility] 30 minutes prior to test start time. You can use the FREE valet parking offered at entrance C (encouraged to control the heart rate for the test)  Proceed to the [facility] Radiology Department (first floor) to check-in and test prep.  All radiology patients and guests should use entrance C2 at [facility], accessed from [facility], even though the hospital's physical address listed is [address].    Please follow these instructions carefully (unless otherwise directed):  An IV will be required for this test and Nitroglycerin  will be given.   On the Night Before the Test: Be sure to Drink plenty of water. Do not consume any caffeinated/decaffeinated beverages or chocolate 12 hours prior to your test. Do not take any antihistamines 12 hours prior to your test.  On the Day of the Test: Drink plenty of water until 1 hour prior to the test. Do not eat any food 1 hour prior to test. You may take your regular medications prior to the test.  Take metoprolol  (Lopressor ) two  hours prior to test. Patients who wear a continuous glucose monitor MUST remove the device prior to scanning. FEMALES- please wear underwire-free bra if available, avoid dresses & tight clothing      After the Test: Drink plenty of water. After receiving IV contrast, you may experience a mild flushed feeling. This is normal. On occasion, you may experience a mild rash up to 24 hours after the test. This is not dangerous. If this occurs, you can take Benadryl 25 mg and increase your fluid intake. If you experience trouble breathing, this can be serious. If it is severe call 911 IMMEDIATELY. If it is mild, please call our office.  We will call to schedule your test 2-4 weeks out understanding that some insurance companies will need an authorization prior to the service being performed.   For more information and frequently asked questions, please visit our website : [email or web address]  For non-scheduling related questions, please contact the cardiac imaging nurse navigator should you have any questions/concerns: Cardiac Imaging Nurse Navigators Direct Office Dial: [phone]   For scheduling needs, including cancellations and rescheduling, please call Brittany, [phone].   Follow-Up: At [facility], you and your health needs are our priority.  As part of our continuing mission to provide you with exceptional heart care, we have created designated Provider Care Teams.  These Care Teams include your primary Cardiologist (physician) and Advanced Practice Providers (APPs -  Physician Assistants and Nurse Practitioners) who all work together to provide you  with the care you need, when you need it.  We recommend signing up for the patient portal called MyChart.  Sign up information is provided on this After Visit Summary.  MyChart is used to connect with patients for Virtual Visits (Telemedicine).  Patients are able to view lab/test results, encounter notes, upcoming  appointments, etc.  Non-urgent messages can be sent to your provider as well.   To learn more about what you can do with MyChart, go to [email or web address].    Your next appointment:   6 week(s)  Provider:   Katlyn West, NP

## 2023-08-11 ENCOUNTER — Other Ambulatory Visit (HOSPITAL_COMMUNITY): Payer: Self-pay

## 2023-08-12 ENCOUNTER — Other Ambulatory Visit (HOSPITAL_COMMUNITY): Payer: Self-pay

## 2023-08-14 ENCOUNTER — Telehealth: Payer: Self-pay

## 2023-08-14 NOTE — Telephone Encounter (Signed)
 Called patient advised of below they verbalized understanding.

## 2023-08-14 NOTE — Telephone Encounter (Signed)
-----   Message from Rip Harbour sent at 08/11/2023  5:06 PM EST ----- Please let Ms. Erisman know that her kidney function is normal and her electrolytes are normal.  hCG is negative.  She can proceed with testing as discussed at appointment.

## 2023-08-23 ENCOUNTER — Other Ambulatory Visit (HOSPITAL_COMMUNITY): Payer: Self-pay

## 2023-08-23 ENCOUNTER — Encounter (HOSPITAL_COMMUNITY): Payer: Self-pay

## 2023-08-25 ENCOUNTER — Ambulatory Visit (HOSPITAL_COMMUNITY)
Admission: RE | Admit: 2023-08-25 | Discharge: 2023-08-25 | Disposition: A | Discharge status: Home / Self Care | Payer: Commercial Managed Care - PPO | Source: Ambulatory Visit | Attending: Cardiology | Admitting: Cardiology

## 2023-08-25 DIAGNOSIS — R072 Precordial pain: Secondary | ICD-10-CM | POA: Insufficient documentation

## 2023-08-25 MED ORDER — NITROGLYCERIN 0.4 MG SL SUBL
0.8000 mg | SUBLINGUAL_TABLET | Freq: Once | SUBLINGUAL | Status: AC
  Administered 2023-08-25: 0.8 mg via SUBLINGUAL

## 2023-08-25 MED ORDER — IOHEXOL 350 MG/ML SOLN
95.0000 mL | Freq: Once | INTRAVENOUS | Status: AC | PRN
  Administered 2023-08-25: 95 mL via INTRAVENOUS

## 2023-08-25 MED ORDER — NITROGLYCERIN 0.4 MG SL SUBL
SUBLINGUAL_TABLET | SUBLINGUAL | Status: AC
  Filled 2023-08-25: qty 2

## 2023-08-26 ENCOUNTER — Other Ambulatory Visit (HOSPITAL_COMMUNITY): Payer: Self-pay

## 2023-08-27 ENCOUNTER — Other Ambulatory Visit: Payer: Self-pay

## 2023-08-28 ENCOUNTER — Ambulatory Visit: Payer: Commercial Managed Care - PPO | Admitting: Internal Medicine

## 2023-08-28 ENCOUNTER — Encounter: Payer: Self-pay | Admitting: Internal Medicine

## 2023-08-28 ENCOUNTER — Other Ambulatory Visit (HOSPITAL_COMMUNITY): Payer: Self-pay

## 2023-08-28 VITALS — BP 132/88 | HR 89 | Temp 98.0°F | Resp 16 | Ht 64.0 in | Wt 175.8 lb

## 2023-08-28 DIAGNOSIS — Z1211 Encounter for screening for malignant neoplasm of colon: Secondary | ICD-10-CM

## 2023-08-28 DIAGNOSIS — D510 Vitamin B12 deficiency anemia due to intrinsic factor deficiency: Secondary | ICD-10-CM | POA: Diagnosis not present

## 2023-08-28 DIAGNOSIS — I10 Essential (primary) hypertension: Secondary | ICD-10-CM

## 2023-08-28 DIAGNOSIS — D500 Iron deficiency anemia secondary to blood loss (chronic): Secondary | ICD-10-CM | POA: Diagnosis not present

## 2023-08-28 LAB — IBC + FERRITIN
Ferritin: 5.2 ng/mL — ABNORMAL LOW (ref 10.0–291.0)
Iron: 71 ug/dL (ref 42–145)
Saturation Ratios: 16.4 % — ABNORMAL LOW (ref 20.0–50.0)
TIBC: 434 ug/dL (ref 250.0–450.0)
Transferrin: 310 mg/dL (ref 212.0–360.0)

## 2023-08-28 LAB — RETICULOCYTES
ABS Retic: 56160 {cells}/uL (ref 20000–80000)
Retic Ct Pct: 1.3 %

## 2023-08-28 LAB — CBC WITH DIFFERENTIAL/PLATELET
Basophils Absolute: 0 10*3/uL (ref 0.0–0.1)
Basophils Relative: 0.3 % (ref 0.0–3.0)
Eosinophils Absolute: 0.1 10*3/uL (ref 0.0–0.7)
Eosinophils Relative: 1.7 % (ref 0.0–5.0)
HCT: 36.8 % (ref 36.0–46.0)
Hemoglobin: 11.9 g/dL — ABNORMAL LOW (ref 12.0–15.0)
Lymphocytes Relative: 34.1 % (ref 12.0–46.0)
Lymphs Abs: 1.9 10*3/uL (ref 0.7–4.0)
MCHC: 32.4 g/dL (ref 30.0–36.0)
MCV: 84.7 fL (ref 78.0–100.0)
Monocytes Absolute: 0.5 10*3/uL (ref 0.1–1.0)
Monocytes Relative: 8.8 % (ref 3.0–12.0)
Neutro Abs: 3.1 10*3/uL (ref 1.4–7.7)
Neutrophils Relative %: 55.1 % (ref 43.0–77.0)
Platelets: 301 10*3/uL (ref 150.0–400.0)
RBC: 4.34 Mil/uL (ref 3.87–5.11)
RDW: 16.5 % — ABNORMAL HIGH (ref 11.5–15.5)
WBC: 5.6 10*3/uL (ref 4.0–10.5)

## 2023-08-28 LAB — BASIC METABOLIC PANEL
BUN: 8 mg/dL (ref 6–23)
CO2: 24 meq/L (ref 19–32)
Calcium: 9 mg/dL (ref 8.4–10.5)
Chloride: 107 meq/L (ref 96–112)
Creatinine, Ser: 0.87 mg/dL (ref 0.40–1.20)
GFR: 80.15 mL/min (ref >60.00)
Glucose, Bld: 119 mg/dL — ABNORMAL HIGH (ref 70–99)
Potassium: 3.5 meq/L (ref 3.5–5.1)
Sodium: 139 meq/L (ref 135–145)

## 2023-08-28 LAB — VITAMIN B12: Vitamin B-12: 687 pg/mL (ref 211–911)

## 2023-08-28 LAB — FOLATE: Folate: >25.2 ng/mL (ref >5.9)

## 2023-08-28 NOTE — Patient Instructions (Signed)
Hypertension, Adult High blood pressure (hypertension) is when the force of blood pumping through the arteries is too strong. The arteries are the blood vessels that carry blood from the heart throughout the body. Hypertension forces the heart to work harder to pump blood and may cause arteries to become narrow or stiff. Untreated or uncontrolled hypertension can lead to a heart attack, heart failure, a stroke, kidney disease, and other problems. A blood pressure reading consists of a higher number over a lower number. Ideally, your blood pressure should be below 120/80. The first ("top") number is called the systolic pressure. It is a measure of the pressure in your arteries as your heart beats. The second ("bottom") number is called the diastolic pressure. It is a measure of the pressure in your arteries as the heart relaxes. What are the causes? The exact cause of this condition is not known. There are some conditions that result in high blood pressure. What increases the risk? Certain factors may make you more likely to develop high blood pressure. Some of these risk factors are under your control, including: Smoking. Not getting enough exercise or physical activity. Being overweight. Having too much fat, sugar, calories, or salt (sodium) in your diet. Drinking too much alcohol. Other risk factors include: Having a personal history of heart disease, diabetes, high cholesterol, or kidney disease. Stress. Having a family history of high blood pressure and high cholesterol. Having obstructive sleep apnea. Age. The risk increases with age. What are the signs or symptoms? High blood pressure may not cause symptoms. Very high blood pressure (hypertensive crisis) may cause: Headache. Fast or irregular heartbeats (palpitations). Shortness of breath. Nosebleed. Nausea and vomiting. Vision changes. Severe chest pain, dizziness, and seizures. How is this diagnosed? This condition is diagnosed by  measuring your blood pressure while you are seated, with your arm resting on a flat surface, your legs uncrossed, and your feet flat on the floor. The cuff of the blood pressure monitor will be placed directly against the skin of your upper arm at the level of your heart. Blood pressure should be measured at least twice using the same arm. Certain conditions can cause a difference in blood pressure between your right and left arms. If you have a high blood pressure reading during one visit or you have normal blood pressure with other risk factors, you may be asked to: Return on a different day to have your blood pressure checked again. Monitor your blood pressure at home for 1 week or longer. If you are diagnosed with hypertension, you may have other blood or imaging tests to help your health care provider understand your overall risk for other conditions. How is this treated? This condition is treated by making healthy lifestyle changes, such as eating healthy foods, exercising more, and reducing your alcohol intake. You may be referred for counseling on a healthy diet and physical activity. Your health care provider may prescribe medicine if lifestyle changes are not enough to get your blood pressure under control and if: Your systolic blood pressure is above 130. Your diastolic blood pressure is above 80. Your personal target blood pressure may vary depending on your medical conditions, your age, and other factors. Follow these instructions at home: Eating and drinking  Eat a diet that is high in fiber and potassium, and low in sodium, added sugar, and fat. An example of this eating plan is called the DASH diet. DASH stands for Dietary Approaches to Stop Hypertension. To eat this way: Eat   plenty of fresh fruits and vegetables. Try to fill one half of your plate at each meal with fruits and vegetables. Eat whole grains, such as whole-wheat pasta, brown rice, or whole-grain bread. Fill about one  fourth of your plate with whole grains. Eat or drink low-fat dairy products, such as skim milk or low-fat yogurt. Avoid fatty cuts of meat, processed or cured meats, and poultry with skin. Fill about one fourth of your plate with lean proteins, such as fish, chicken without skin, beans, eggs, or tofu. Avoid pre-made and processed foods. These tend to be higher in sodium, added sugar, and fat. Reduce your daily sodium intake. Many people with hypertension should eat less than 1,500 mg of sodium a day. Do not drink alcohol if: Your health care provider tells you not to drink. You are pregnant, may be pregnant, or are planning to become pregnant. If you drink alcohol: Limit how much you have to: 0-1 drink a day for women. 0-2 drinks a day for men. Know how much alcohol is in your drink. In the [place] one drink equals one 12 oz bottle of beer (355 mL), one 5 oz glass of wine (148 mL), or one 1 oz glass of hard liquor (44 mL). Lifestyle  Work with your health care provider to maintain a healthy body weight or to lose weight. Ask what an ideal weight is for you. Get at least 30 minutes of exercise that causes your heart to beat faster (aerobic exercise) most days of the week. Activities may include walking, swimming, or biking. Include exercise to strengthen your muscles (resistance exercise), such as Pilates or lifting weights, as part of your weekly exercise routine. Try to do these types of exercises for 30 minutes at least 3 days a week. Do not use any products that contain nicotine or tobacco. These products include cigarettes, chewing tobacco, and vaping devices, such as e-cigarettes. If you need help quitting, ask your health care provider. Monitor your blood pressure at home as told by your health care provider. Keep all follow-up visits. This is important. Medicines Take over-the-counter and prescription medicines only as told by your health care provider. Follow directions carefully. Blood  pressure medicines must be taken as prescribed. Do not skip doses of blood pressure medicine. Doing this puts you at risk for problems and can make the medicine less effective. Ask your health care provider about side effects or reactions to medicines that you should watch for. Contact a health care provider if you: Think you are having a reaction to a medicine you are taking. Have headaches that keep coming back (recurring). Feel dizzy. Have swelling in your ankles. Have trouble with your vision. Get help right away if you: Develop a severe headache or confusion. Have unusual weakness or numbness. Feel faint. Have severe pain in your chest or abdomen. Vomit repeatedly. Have trouble breathing. These symptoms may be an emergency. Get help right away. Call 911. Do not wait to see if the symptoms will go away. Do not drive yourself to the hospital. Summary Hypertension is when the force of blood pumping through your arteries is too strong. If this condition is not controlled, it may put you at risk for serious complications. Your personal target blood pressure may vary depending on your medical conditions, your age, and other factors. For most people, a normal blood pressure is less than 120/80. Hypertension is treated with lifestyle changes, medicines, or a combination of both. Lifestyle changes include losing weight, eating a healthy,   low-sodium diet, exercising more, and limiting alcohol. This information is not intended to replace advice given to you by your health care provider. Make sure you discuss any questions you have with your health care provider. Document Revised: 06/01/2021 Document Reviewed: 06/01/2021 Elsevier Patient Education  2024 [employer]  

## 2023-08-28 NOTE — Progress Notes (Unsigned)
Subjective:  Patient ID: Felicia Johnson, female    DOB: 1975/08/31  Age: 48 y.o. MRN: [id number]  CC: Hypertension and Anemia   HPI KHLOEY PELLETT presents for f/up ----  Discussed the use of AI scribe software for clinical note transcription with the patient, who gave verbal consent to proceed.  History of Present Illness   The patient, with a history of hypertension, presented with an episode of discomfort in the chest area, initially perceived as gas pains. The discomfort was described as a band-like sensation around the chest, which prompted a blood pressure check at work. During this episode, the patient became diaphoretic and was advised to go to the hospital. The patient underwent a 24-hour hospital stay and a heart CT scan, which reportedly showed no abnormalities. The patient's blood pressure was noted to be high during this episode.  The patient is currently on nebivolol 10mg  for hypertension. She reported no chest pain but did experience a viral infection around Christmas time, which caused a significant cough, now resolved. The patient also reported anemia in December, with a hemoglobin level of 10.9. The patient has a history of heavy menstrual cycles, lasting five to seven days, every 28 days, which may contribute to the anemia. The patient is under the care of a gynecologist for a fibroid, which is suspected to be the cause of the heavy menstrual bleeding.  The patient is taking several supplements, including vitamin D3, B1, beetroot, and vitamin C. The patient was previously on iron supplements, but these were stopped due to high iron levels. The patient expressed a desire to manage her hypertension through lifestyle modifications, including weight loss, rather than additional medication.       Outpatient Medications Prior to Visit  Medication Sig Dispense Refill   aspirin EC 81 MG tablet Take 1 tablet (81 mg total) by mouth daily. Swallow whole. 30 tablet 12   atorvastatin  (LIPITOR) 40 MG tablet Take 1 tablet (40 mg total) by mouth at bedtime. 30 tablet 3   Cholecalciferol 50 MCG (2000 UT) TABS Take 1 tablet (2,000 Units total) by mouth daily. 90 tablet 1   fluticasone (FLONASE) 50 MCG/ACT nasal spray Place 2 sprays into both nostrils daily. 48 g 1   levocetirizine (XYZAL) 5 MG tablet Take 1 tablet (5 mg total) by mouth every evening. 90 tablet 1   Misc Natural Products (BEET ROOT) 500 MG CAPS Take 500 mg by mouth daily.     montelukast (SINGULAIR) 10 MG tablet Take 1 tablet (10 mg total) by mouth at bedtime. 90 tablet 0   nebivolol (BYSTOLIC) 10 MG tablet Take 1 tablet (10 mg total) by mouth daily. 90 tablet 1   pantoprazole (PROTONIX) 40 MG tablet Take 1 tablet (40 mg total) by mouth 2 (two) times daily before a meal for 1 month.  Then reduce to 1 tablet (40mg ) by mouth once daily 90 tablet 3   thiamine (VITAMIN B-1) 50 MG tablet Take 1 tablet by mouth daily. 90 tablet 1   metoprolol tartrate (LOPRESSOR) 100 MG tablet Take 1 tablet 2 hours prior to Coronary CTA 1 tablet 0   No facility-administered medications prior to visit.    ROS Review of Systems  Objective:  BP 132/88 (BP Location: Left Arm, Patient Position: Sitting, Cuff Size: Normal)   Pulse 89   Temp 98 F (36.7 C) (Oral)   Resp 16   Ht 5\' 4"  (1.626 m)   Wt 175 lb 12.8 oz (79.7 kg)  LMP 08/08/2023 (Approximate)   SpO2 97%   BMI 30.18 kg/m   BP Readings from Last 3 Encounters:  08/28/23 132/88  08/25/23 114/81  08/10/23 122/84    Wt Readings from Last 3 Encounters:  08/28/23 175 lb 12.8 oz (79.7 kg)  08/10/23 174 lb (78.9 kg)  02/24/23 174 lb (78.9 kg)    Physical Exam  Lab Results  Component Value Date   WBC 5.6 08/28/2023   HGB 11.9 (L) 08/28/2023   HCT 36.8 08/28/2023   PLT 301.0 08/28/2023   GLUCOSE 119 (H) 08/28/2023   CHOL 197 03/03/2023   TRIG 63.0 03/03/2023   HDL 59.70 03/03/2023   LDLDIRECT 134.7 05/10/2013   LDLCALC 125 (H) 03/03/2023   ALT 13 07/27/2023    AST 19 07/27/2023   NA 139 08/28/2023   K 3.5 08/28/2023   CL 107 08/28/2023   CREATININE 0.87 08/28/2023   BUN 8 08/28/2023   CO2 24 08/28/2023   TSH 1.70 03/03/2023    CT CORONARY MORPH W/CTA COR W/SCORE W/CA W/CM &/OR WO/CM Result Date: 08/28/2023 HISTORY: 48 yo female with precordial pain EXAM: Cardiac/Coronary CTA TECHNIQUE: The patient was scanned on a Office manager. PROTOCOL: A 100 kV prospective scan was triggered in the descending thoracic aorta at 111 HU's. Axial non-contrast 3 mm slices were carried out through the heart. The data set was analyzed on a dedicated work station and scored using the Agatson method. Gantry rotation speed was 250 msecs and collimation was .6 mm. Beta blockade and 0.8 mg of sl NTG was given. The 3D data set was reconstructed in 5% intervals of the 35-75 % of the R-R cycle. Diastolic phases were analyzed on a dedicated work station using MPR, MIP and VRT modes. The patient received 95mL OMNIPAQUE IOHEXOL 350 MG/ML SOLN contrast. FINDINGS: Quality: Good, HR 69 Coronary calcium score: The patient's coronary artery calcium score is 0. Coronary arteries: Normal coronary origins.  Right dominance. Right Coronary Artery: Dominant. No disease. Normal R-PLB and R-PDA branches. Left Main Coronary Artery: Normal. Bifurcates into the LAD and LCx arteries. Left Anterior Descending Coronary Artery: Large anterior artery that wraps around the apex. No disease. Larger D1 and smaller D2 branches, no disease. Left Circumflex Artery: AV groove vessel, no disease. Aorta: Normal size, 35 mm at the mid ascending aorta (level of the PA bifurcation) measured double oblique. No calcifications. No dissection. Aortic Valve: Trileaflet. No calcifications. Other findings: Normal pulmonary vein drainage into the left atrium. Normal left atrial appendage without a thrombus. Dilated main pulmonary artery to 33 mm, suggestive of pulmonary hypertension. IMPRESSION: 1. No evidence of CAD,  CADRADS = 0. 2. Coronary artery calcium score is 0. 3. Normal coronary origin with right dominance. 4. Dilated main pulmonary artery to 33 mm, suggestive of pulmonary hypertension. 5. Consider non-coronary causes of chest pain. Electronically Signed   By: Chrystie Nose M.D.   On: 08/28/2023 09:35    Assessment & Plan:  Vitamin B12 deficiency anemia due to intrinsic factor deficiency -     CBC with Differential/Platelet; Future -     Folate; Future -     Vitamin B12; Future  Iron deficiency anemia due to chronic blood loss -     CBC with Differential/Platelet; Future -     Reticulocytes; Future -     IBC + Ferritin; Future -     Ferrous Sulfate; Take 1 tablet (325 mg total) by mouth daily with breakfast.  Dispense: 90 tablet; Refill: 1  Primary hypertension -     Basic metabolic panel; Future  Screening for colon cancer -     Ambulatory referral to Gastroenterology     Follow-up: Return in about 6 months (around 02/25/2024).  Sanda Linger, MD

## 2023-08-29 ENCOUNTER — Other Ambulatory Visit (HOSPITAL_COMMUNITY): Payer: Self-pay

## 2023-08-29 ENCOUNTER — Encounter: Payer: Self-pay | Admitting: Internal Medicine

## 2023-08-29 ENCOUNTER — Telehealth: Payer: Self-pay

## 2023-08-29 MED ORDER — FERROUS SULFATE 325 (65 FE) MG PO TABS
325.0000 mg | ORAL_TABLET | Freq: Every day | ORAL | 1 refills | Status: DC
  Filled 2023-08-29: qty 90, 90d supply, fill #0

## 2023-08-29 NOTE — Telephone Encounter (Signed)
-----   Message from Rip Harbour sent at 08/28/2023 10:27 AM EST ----- Please let Felicia Johnson know that her coronary CTA showed no evidence of coronary artery disease. Good result, we will discuss further on follow up.

## 2023-08-29 NOTE — Telephone Encounter (Signed)
Left message to call back  

## 2023-08-30 MED ORDER — ACCRUFER 30 MG PO CAPS: 1.0000 | ORAL_CAPSULE | Freq: Two times a day (BID) | ORAL | 1 refills | Status: DC

## 2023-08-31 ENCOUNTER — Other Ambulatory Visit (HOSPITAL_COMMUNITY): Payer: Self-pay

## 2023-08-31 NOTE — Telephone Encounter (Signed)
Called patient advised of below they verbalized understanding.

## 2023-09-06 ENCOUNTER — Ambulatory Visit: Payer: 59 | Admitting: Internal Medicine

## 2023-09-12 ENCOUNTER — Other Ambulatory Visit (HOSPITAL_COMMUNITY): Payer: Self-pay

## 2023-09-13 ENCOUNTER — Other Ambulatory Visit: Payer: Self-pay

## 2023-09-13 DIAGNOSIS — D500 Iron deficiency anemia secondary to blood loss (chronic): Secondary | ICD-10-CM

## 2023-09-13 MED ORDER — ACCRUFER 30 MG PO CAPS: 1.0000 | ORAL_CAPSULE | Freq: Two times a day (BID) | ORAL | 1 refills | Status: AC

## 2023-09-21 ENCOUNTER — Other Ambulatory Visit (HOSPITAL_COMMUNITY): Payer: Self-pay

## 2023-09-21 MED ORDER — BISACODYL 5 MG PO TBEC
DELAYED_RELEASE_TABLET | ORAL | 0 refills | Status: DC
  Filled 2023-09-21: qty 4, 1d supply, fill #0

## 2023-09-21 MED ORDER — PEG 3350-KCL-NA BICARB-NACL 420 G PO SOLR
ORAL | 0 refills | Status: DC
  Filled 2023-09-21: qty 4000, 1d supply, fill #0

## 2023-09-22 ENCOUNTER — Other Ambulatory Visit (HOSPITAL_COMMUNITY): Payer: Self-pay

## 2023-09-22 ENCOUNTER — Other Ambulatory Visit: Payer: Self-pay

## 2023-09-23 ENCOUNTER — Other Ambulatory Visit (HOSPITAL_COMMUNITY): Payer: Self-pay

## 2023-09-28 NOTE — Progress Notes (Signed)
 Cardiology Office Note    Date:  09/29/2023  ID:  Felicia Johnson, DOB 04-16-1976, MRN [id number] PCP:  Etta Grandchild, MD  Cardiologist:  Orbie Pyo, MD  Electrophysiologist:  None   Chief Complaint: Follow up for chest pain   History of Present Illness: .    Felicia Johnson is a 48 y.o. female with visit-pertinent history of hypertension, hyperlipidemia, GERD, degenerating uterine fibroid.  On 07/27/2023 she presented to the ED with an episode of upper back pressure and chest pressure.  Patient reported that she belched and felt better.  Her initial evaluation in the ED indicated blood pressure 116/68, oxygen 99% on room air, heart rate 70 bpm.  Her EKG showed normal sinus rhythm with a heart rate of 72 bpm.  High sensitive troponin 16>118>125>110.  Her chest x-ray showed no active cardiopulmonary disease, CTA of the chest/abdomen/pelvis showed no acute aortic syndrome, no evidence of PE, large 15.1 cm mass originating from the uterine wall with central necrosis, likely a degenerating fibroid.  On 12/19 while at work she had a sudden onset of a bandlike tightness across her chest and upper back, tightness did not radiate, she denied any associated shortness of breath or nausea.  A coworker checked her blood pressure which was reportedly normal.  She noted that she had a brief episode of diaphoresis.  On the way to the ER patient burped twice and her symptoms completely resolved.  She denied any recurrence of chest pain or tightness while at the hospital.  She denied any chest pain or shortness of breath prior to episode.  Patient was seen by Dr. Lynnette Caffey, recommended outpatient coronary CTA.  Echocardiogram on 07/28/2023 indicated LVEF of 60 to 65%, no RWMA, diastolic parameters were normal, there were no significant valvular abnormalities.  Patient was last seen in clinic on 08/10/2023 for follow-up.  She reported that she is doing well.  She had had 1 episode of upper/mid back pain after she was  discharged from the hospital associated diaphoresis, she took an additional Protonix with improvement in her systems.  She felt this was gas pain.  Coronary CTA on 08/25/2023 indicated a coronary calcium score of 0, with no evidence of CAD.  Her pulmonary artery was dilated to 33 mm, suggestive of pulmonary hypertension.  Today she presents for follow-up.  She reports that she is doing very well.  She denies any further episodes of chest pain, she denies shortness of breath, lower extremity edema, palpitations, orthopnea or PND.  She reports that she wears her CPAP nightly notes that this has significant improved her prior palpitations.  She has started exercising multiple times a week using a kettle bell, doing planks and mountain climber's.  She plans to start walking daily once the weather improves.  She notes that she is planning to undergo a colonoscopy in April, patient is doing well and would be at appropriate risk for procedure.  Labwork independently reviewed: 08/28/2023: Sodium 139, potassium 3.5, creatinine 0.87, hemoglobin 11.9, hematocrit 36.8 ROS: .   Today she denies chest pain, shortness of breath, lower extremity edema, fatigue, palpitations, melena, hematuria, hemoptysis, diaphoresis, weakness, presyncope, syncope, orthopnea, and PND.  All other systems are reviewed and otherwise negative. Studies Reviewed: [name]    EKG:  EKG is not ordered today.   CV Studies:  Cardiac Studies & Procedures   ______________________________________________________________________________________________     ECHOCARDIOGRAM  ECHOCARDIOGRAM COMPLETE 07/28/2023  Narrative ECHOCARDIOGRAM REPORT    [name]  A Hughlett Date of Exam: 07/28/2023 Medical Rec #:  [id number]     Height:       64.0 in Accession #:    [id number]    Weight:       174.0 lb Date of Birth:  07-12-1976     BSA:          1.844 m Patient Age:    47 years      BP:           111/53 mmHg Patient Gender: F             HR:            73 bpm. Exam Location:  Inpatient  Procedure: Cardiac Doppler, Color Doppler, 3D Echo and 2D Echo  Indications:    Chest Pain R07.9  History:        Patient has no prior history of Echocardiogram examinations. Arrythmias:Tachycardia, Signs/Symptoms:Chest Pain; Risk Factors:Hypertension, Sleep Apnea and Dyslipidemia.  Sonographer:    Lucendia Herrlich RCS Referring Phys: [id number] SARA-MAIZ A THOMAS  IMPRESSIONS   1. Left ventricular ejection fraction, by estimation, is 60 to 65%. Left ventricular ejection fraction by 3D volume is 67 %. The left ventricle has normal function. The left ventricle has no regional wall motion abnormalities. Left ventricular diastolic parameters were normal. 2. Right ventricular systolic function is normal. The right ventricular size is normal. There is normal pulmonary artery systolic pressure. The estimated right ventricular systolic pressure is 24.0 mmHg. 3. The mitral valve is normal in structure. Trivial mitral valve regurgitation. No evidence of mitral stenosis. 4. The aortic valve is tricuspid. Aortic valve regurgitation is trivial. No aortic stenosis is present. 5. The inferior vena cava is normal in size with greater than 50% respiratory variability, suggesting right atrial pressure of 3 mmHg.  FINDINGS Left Ventricle: Left ventricular ejection fraction, by estimation, is 60 to 65%. Left ventricular ejection fraction by 3D volume is 67 %. The left ventricle has normal function. The left ventricle has no regional wall motion abnormalities. The left ventricular internal cavity size was normal in size. There is no left ventricular hypertrophy. Left ventricular diastolic parameters were normal.  Right Ventricle: The right ventricular size is normal. No increase in right ventricular wall thickness. Right ventricular systolic function is normal. There is normal pulmonary artery systolic pressure. The tricuspid regurgitant velocity is 2.29 m/s,  and with an assumed right atrial pressure of 3 mmHg, the estimated right ventricular systolic pressure is 24.0 mmHg.  Left Atrium: Left atrial size was normal in size.  Right Atrium: Right atrial size was normal in size.  Pericardium: There is no evidence of pericardial effusion.  Mitral Valve: The mitral valve is normal in structure. Trivial mitral valve regurgitation. No evidence of mitral valve stenosis.  Tricuspid Valve: The tricuspid valve is normal in structure. Tricuspid valve regurgitation is trivial. No evidence of tricuspid stenosis.  Aortic Valve: The aortic valve is tricuspid. Aortic valve regurgitation is trivial. No aortic stenosis is present. Aortic valve peak gradient measures 5.6 mmHg.  Pulmonic Valve: The pulmonic valve was normal in structure. Pulmonic valve regurgitation is trivial. No evidence of pulmonic stenosis.  Aorta: The aortic root is normal in size and structure.  Venous: The inferior vena cava is normal in size with greater than 50% respiratory variability, suggesting right atrial pressure of 3 mmHg.  IAS/Shunts: The interatrial septum was not well visualized.   LEFT VENTRICLE PLAX 2D LVIDd:         4.20  cm         Diastology LVIDs:         2.65 cm         LV e' medial:    10.80 cm/s LV PW:         0.60 cm         LV E/e' medial:  8.6 LV IVS:        0.80 cm         LV e' lateral:   15.90 cm/s LVOT diam:     2.00 cm         LV E/e' lateral: 5.9 LV SV:         52 LV SV Index:   28 LVOT Area:     3.14 cm        3D Volume EF LV 3D EF:    Left ventricul ar ejection fraction by 3D volume is 67 %.  3D Volume EF: 3D EF:        67 % LV EDV:       127 ml LV ESV:       42 ml LV SV:        85 ml  RIGHT VENTRICLE             IVC RV S prime:     10.40 cm/s  IVC diam: 1.50 cm TAPSE (M-mode): 1.8 cm  LEFT ATRIUM             Index        RIGHT ATRIUM          Index LA diam:        3.40 cm 1.84 cm/m   RA Area:     9.08 cm LA Vol (A2C):   43.0 ml  23.32 ml/m  RA Volume:   15.00 ml 8.13 ml/m LA Vol (A4C):   32.1 ml 17.41 ml/m LA Biplane Vol: 37.1 ml 20.12 ml/m AORTIC VALVE AV Area (Vmax): 2.38 cm AV Vmax:        118.50 cm/s AV Peak Grad:   5.6 mmHg LVOT Vmax:      89.60 cm/s LVOT Vmean:     55.500 cm/s LVOT VTI:       0.165 m  AORTA Ao Root diam: 3.10 cm Ao Asc diam:  3.40 cm  MITRAL VALVE               TRICUSPID VALVE MV Area (PHT): 4.89 cm    TR Peak grad:   21.0 mmHg MV Decel Time: 155 msec    TR Vmax:        229.00 cm/s MV E velocity: 93.30 cm/s MV A velocity: 82.30 cm/s  SHUNTS MV E/A ratio:  1.13        Systemic VTI:  0.16 m Systemic Diam: 2.00 cm  Weston Brass MD Electronically signed by Weston Brass MD Signature Date/Time: 07/28/2023/5:22:02 PM    Final    MONITORS  CARDIAC EVENT MONITOR 02/02/2021  Narrative Sinus rhythm with sinus tachycardia No atrial fibrillation Rare ventricular ectopy  Will Camnitz, MD   CT SCANS  CT CORONARY MORPH W/CTA COR W/SCORE 08/25/2023  Addendum 09/02/2023 10:34 AM ADDENDUM REPORT: 09/02/2023 10:31  EXAM: OVER-READ INTERPRETATION  CT CHEST  The following report is an over-read performed by radiologist Dr. Narda Rutherford of [facility] on 09/02/2023. This over-read does not include interpretation of cardiac or coronary anatomy or pathology. The coronary CTA interpretation by the cardiologist is attached.  COMPARISON:  Chest CTA 07/27/2023  FINDINGS: Vascular: No aortic atherosclerosis. The included aorta is normal in caliber.  Mediastinum/nodes: No adenopathy or mass. Unremarkable esophagus.  Lungs: No focal airspace disease. No pulmonary nodule. No pleural fluid. The included airways are patent.  Upper abdomen: No acute or unexpected findings.  Musculoskeletal: There are no acute or suspicious osseous abnormalities.  IMPRESSION: No acute or unexpected extracardiac findings.   Electronically Signed By: Narda Rutherford  M.D. On: 09/02/2023 10:31  Narrative HISTORY: 48 yo female with precordial pain  EXAM: Cardiac/Coronary CTA  TECHNIQUE: The patient was scanned on a [employer].  PROTOCOL: A 100 kV prospective scan was triggered in the descending thoracic aorta at 111 HU's. Axial non-contrast 3 mm slices were carried out through the heart. The data set was analyzed on a dedicated work station and scored using the Agatson method. Gantry rotation speed was 250 msecs and collimation was .6 mm. Beta blockade and 0.8 mg of sl NTG was given. The 3D data set was reconstructed in 5% intervals of the 35-75 % of the R-R cycle. Diastolic phases were analyzed on a dedicated work station using MPR, MIP and VRT modes. The patient received 95mL OMNIPAQUE IOHEXOL 350 MG/ML SOLN contrast.  FINDINGS: Quality: Good, HR 69  Coronary calcium score: The patient's coronary artery calcium score is 0.  Coronary arteries: Normal coronary origins.  Right dominance.  Right Coronary Artery: Dominant. No disease. Normal R-PLB and R-PDA branches.  Left Main Coronary Artery: Normal. Bifurcates into the LAD and LCx arteries.  Left Anterior Descending Coronary Artery: Large anterior artery that wraps around the apex. No disease. Larger D1 and smaller D2 branches, no disease.  Left Circumflex Artery: AV groove vessel, no disease.  Aorta: Normal size, 35 mm at the mid ascending aorta (level of the PA bifurcation) measured double oblique. No calcifications. No dissection.  Aortic Valve: Trileaflet. No calcifications.  Other findings:  Normal pulmonary vein drainage into the left atrium.  Normal left atrial appendage without a thrombus.  Dilated main pulmonary artery to 33 mm, suggestive of pulmonary hypertension.  IMPRESSION: 1. No evidence of CAD, CADRADS = 0.  2. Coronary artery calcium score is 0.  3. Normal coronary origin with right dominance.  4. Dilated main pulmonary artery to 33 mm,  suggestive of pulmonary hypertension.  5. Consider non-coronary causes of chest pain.  Electronically Signed: By: Chrystie Nose M.D. On: 08/28/2023 09:35     ______________________________________________________________________________________________      Current Reported Medications:.    Current Meds  Medication Sig   aspirin EC 81 MG tablet Take 1 tablet (81 mg total) by mouth daily. Swallow whole.   atorvastatin (LIPITOR) 40 MG tablet Take 1 tablet (40 mg total) by mouth at bedtime.   bisacodyl (DULCOLAX) 5 MG EC tablet Take by mouth as directed.   Cholecalciferol 50 MCG (2000 UT) TABS Take 1 tablet (2,000 Units total) by mouth daily.   Ferric Maltol (ACCRUFER) 30 MG CAPS Take 1 capsule (30 mg total) by mouth in the morning and at bedtime.   fluticasone (FLONASE) 50 MCG/ACT nasal spray Place 2 sprays into both nostrils daily.   levocetirizine (XYZAL) 5 MG tablet Take 1 tablet (5 mg total) by mouth every evening.   Misc Natural Products (BEET ROOT) 500 MG CAPS Take 500 mg by mouth daily.   montelukast (SINGULAIR) 10 MG tablet Take 1 tablet (10 mg total) by mouth at bedtime.   nebivolol (BYSTOLIC) 10 MG tablet Take 1 tablet (10  mg total) by mouth daily.   pantoprazole (PROTONIX) 40 MG tablet Take 1 tablet (40 mg total) by mouth 2 (two) times daily before a meal for 1 month.  Then reduce to 1 tablet (40mg ) by mouth once daily   polyethylene glycol-electrolytes (NULYTELY) 420 g solution Take as directed per instructions.   thiamine (VITAMIN B-1) 50 MG tablet Take 1 tablet by mouth daily.   Physical Exam:    VS:  BP 108/72   Pulse 86   Ht 5\' 4"  (1.626 m)   Wt 175 lb (79.4 kg)   SpO2 99%   BMI 30.04 kg/m    Wt Readings from Last 3 Encounters:  09/29/23 175 lb (79.4 kg)  08/28/23 175 lb 12.8 oz (79.7 kg)  08/10/23 174 lb (78.9 kg)    GEN: Well nourished, well developed in no acute distress NECK: No JVD; No carotid bruits CARDIAC: RRR, no murmurs, rubs,  gallops RESPIRATORY:  Clear to auscultation without rales, wheezing or rhonchi  ABDOMEN: Soft, non-tender, non-distended EXTREMITIES:  No edema; No acute deformity   Asessement and Plan:.    Chest pain: Patient presented to the ED on 07/27/2023 with an episode of upper back and chest pressure.  Her EKG was nonischemic, high sensitive troponin 16>>118>>125>>110.  CT showed aortic atherosclerosis, she started on aspirin 81 mg daily and statin therapy. Coronary CTA on 08/25/2023 indicated a coronary calcium score of 0, with no evidence of CAD.  Her pulmonary artery was dilated to 33 mm, suggestive of pulmonary hypertension, she denies any shortness of breath, orthopnea or PND.  She will monitor for symptoms.  She denies any further chest pain. Continue aspirin 81 mg daily  Hypertension: Blood pressure today 108/72.  She notes that she usually drinks more water than she has consumed today.  She will continue to monitor her blood pressure at home and reach out to her PCP if consistently low. Continue nebivolol 10 mg daily  Uterine fibroid: CTA during admission showed a large 15.1 cm uterine mass originate from the uterine wall with central necrosis, suspected degenerating fibroid.  Patient is being followed by GYN.  Hyperlipidemia: Last lipid profile on 03/03/2023 indicated total cholesterol 197, triglyceride 63, HDL 59.7 and LDL 125.  Started on atorvastatin 40 mg daily during her admission in December.  Check fasting lipid profile and LFTs.    Disposition: F/u with Dr. Lynnette Caffey or Reather Littler, NP in 6 months or sooner if needed.   Signed, Rip Harbour, NP

## 2023-09-29 ENCOUNTER — Ambulatory Visit: Payer: Commercial Managed Care - PPO | Attending: Cardiology | Admitting: Cardiology

## 2023-09-29 ENCOUNTER — Telehealth: Payer: Self-pay | Admitting: Internal Medicine

## 2023-09-29 ENCOUNTER — Telehealth: Payer: Self-pay

## 2023-09-29 ENCOUNTER — Other Ambulatory Visit (HOSPITAL_COMMUNITY): Payer: Self-pay

## 2023-09-29 ENCOUNTER — Encounter: Payer: Self-pay | Admitting: Cardiology

## 2023-09-29 VITALS — BP 108/72 | HR 86 | Ht 64.0 in | Wt 175.0 lb

## 2023-09-29 DIAGNOSIS — I700 Atherosclerosis of aorta: Secondary | ICD-10-CM | POA: Diagnosis not present

## 2023-09-29 DIAGNOSIS — R072 Precordial pain: Secondary | ICD-10-CM | POA: Diagnosis not present

## 2023-09-29 DIAGNOSIS — E782 Mixed hyperlipidemia: Secondary | ICD-10-CM

## 2023-09-29 DIAGNOSIS — I10 Essential (primary) hypertension: Secondary | ICD-10-CM | POA: Diagnosis not present

## 2023-09-29 NOTE — Patient Instructions (Signed)
 Medication Instructions:  No changes *If you need a refill on your cardiac medications before your next appointment, please call your pharmacy*  Lab Work: No labs  Testing/Procedures: No testing  Follow-Up: At [facility], you and your health needs are our priority.  As part of our continuing mission to provide you with exceptional heart care, we have created designated Provider Care Teams.  These Care Teams include your primary Cardiologist (physician) and Advanced Practice Providers (APPs -  Physician Assistants and Nurse Practitioners) who all work together to provide you with the care you need, when you need it.  We recommend signing up for the patient portal called "MyChart".  Sign up information is provided on this After Visit Summary.  MyChart is used to connect with patients for Virtual Visits (Telemedicine).  Patients are able to view lab/test results, encounter notes, upcoming appointments, etc.  Non-urgent messages can be sent to your provider as well.   To learn more about what you can do with MyChart, go to [email or web address].    Your next appointment:   6 month(s)  Provider:   Orbie Pyo, MD  or Reather Littler, NP  Other Instructions

## 2023-09-29 NOTE — Telephone Encounter (Signed)
 Pharmacy Patient Advocate Encounter  Received notification from [facility] that Prior Authorization for Accrufer 30mg  caps has been CANCELLED due to this drug is not covered under the pharmacy benefit. Prior authorization is not available.

## 2023-09-29 NOTE — Telephone Encounter (Signed)
 Copied from CRM [id number]. Topic: Clinical - Medication Question >> Sep 29, 2023  1:47 PM [place] J wrote: Reason for CRM: Zella Ball calling in from [facility] wanting to get a status update on the prior authorization for Ferric Maltol (ACCRUFER) 30 MG. Please call back at [phone]

## 2023-09-29 NOTE — Telephone Encounter (Signed)
 Pharmacy Patient Advocate Encounter   Received notification from Pt Calls Messages that prior authorization for Accrufer 30mg  caps is required/requested.   Insurance verification completed.   The patient is insured through [facility] .   Per test claim: PA required; PA submitted to above mentioned insurance via CoverMyMeds Key/confirmation #/EOC [id number] Status is pending

## 2023-09-30 ENCOUNTER — Encounter: Payer: Self-pay | Admitting: Cardiology

## 2023-10-02 NOTE — Telephone Encounter (Signed)
 Felicia Johnson has been made aware that this PA was cancelled. She states that she will let blink know It was denied and they will reach out to the patient

## 2023-10-06 ENCOUNTER — Other Ambulatory Visit (HOSPITAL_COMMUNITY): Payer: Self-pay

## 2023-10-12 ENCOUNTER — Other Ambulatory Visit: Payer: Self-pay | Admitting: Internal Medicine

## 2023-10-12 ENCOUNTER — Other Ambulatory Visit (HOSPITAL_COMMUNITY): Payer: Self-pay

## 2023-10-12 DIAGNOSIS — J301 Allergic rhinitis due to pollen: Secondary | ICD-10-CM

## 2023-10-12 MED ORDER — FLUTICASONE PROPIONATE 50 MCG/ACT NA SUSP
2.0000 | Freq: Every day | NASAL | 1 refills | Status: DC
  Filled 2023-10-12: qty 48, 90d supply, fill #0
  Filled 2024-01-01: qty 48, 90d supply, fill #1

## 2023-10-14 ENCOUNTER — Other Ambulatory Visit (HOSPITAL_COMMUNITY): Payer: Self-pay

## 2023-10-18 DIAGNOSIS — E782 Mixed hyperlipidemia: Secondary | ICD-10-CM | POA: Diagnosis not present

## 2023-10-18 DIAGNOSIS — R079 Chest pain, unspecified: Secondary | ICD-10-CM | POA: Diagnosis not present

## 2023-10-18 DIAGNOSIS — I10 Essential (primary) hypertension: Secondary | ICD-10-CM | POA: Diagnosis not present

## 2023-10-18 DIAGNOSIS — I700 Atherosclerosis of aorta: Secondary | ICD-10-CM | POA: Diagnosis not present

## 2023-10-18 LAB — HEPATIC FUNCTION PANEL
ALT: 26 IU/L (ref 0–32)
AST: 25 IU/L (ref 0–40)
Albumin: 4.2 g/dL (ref 3.9–4.9)
Alkaline Phosphatase: 47 IU/L (ref 44–121)
Bilirubin Total: 0.3 mg/dL (ref 0.0–1.2)
Bilirubin, Direct: 0.12 mg/dL (ref 0.00–0.40)
Total Protein: 7.1 g/dL (ref 6.0–8.5)

## 2023-10-18 LAB — LIPID PANEL
Chol/HDL Ratio: 2 ratio (ref 0.0–4.4)
Cholesterol, Total: 133 mg/dL (ref 100–199)
HDL: 65 mg/dL (ref >39)
LDL Chol Calc (NIH): 56 mg/dL (ref 0–99)
Triglycerides: 51 mg/dL (ref 0–149)
VLDL Cholesterol Cal: 12 mg/dL (ref 5–40)

## 2023-11-09 ENCOUNTER — Other Ambulatory Visit: Payer: Self-pay | Admitting: Internal Medicine

## 2023-11-09 DIAGNOSIS — J301 Allergic rhinitis due to pollen: Secondary | ICD-10-CM

## 2023-11-10 ENCOUNTER — Other Ambulatory Visit (HOSPITAL_COMMUNITY): Payer: Self-pay

## 2023-11-10 MED ORDER — LEVOCETIRIZINE DIHYDROCHLORIDE 5 MG PO TABS
5.0000 mg | ORAL_TABLET | Freq: Every evening | ORAL | 1 refills | Status: DC
  Filled 2023-11-10: qty 90, 90d supply, fill #0
  Filled 2024-02-02: qty 90, 90d supply, fill #1

## 2023-11-17 ENCOUNTER — Other Ambulatory Visit (HOSPITAL_COMMUNITY): Payer: Self-pay

## 2023-11-23 DIAGNOSIS — Z1211 Encounter for screening for malignant neoplasm of colon: Secondary | ICD-10-CM | POA: Diagnosis not present

## 2023-11-23 LAB — HM COLONOSCOPY

## 2023-11-24 ENCOUNTER — Other Ambulatory Visit: Payer: Self-pay

## 2023-11-24 ENCOUNTER — Other Ambulatory Visit: Payer: Self-pay | Admitting: Internal Medicine

## 2023-11-24 DIAGNOSIS — J301 Allergic rhinitis due to pollen: Secondary | ICD-10-CM

## 2023-11-25 ENCOUNTER — Other Ambulatory Visit (HOSPITAL_COMMUNITY): Payer: Self-pay

## 2023-11-27 ENCOUNTER — Other Ambulatory Visit (HOSPITAL_COMMUNITY): Payer: Self-pay

## 2023-11-28 ENCOUNTER — Encounter: Payer: Self-pay | Admitting: Internal Medicine

## 2023-11-29 ENCOUNTER — Other Ambulatory Visit (HOSPITAL_COMMUNITY): Payer: Self-pay

## 2023-11-29 ENCOUNTER — Other Ambulatory Visit: Payer: Self-pay

## 2023-11-29 ENCOUNTER — Other Ambulatory Visit: Payer: Self-pay | Admitting: Internal Medicine

## 2023-11-29 DIAGNOSIS — J301 Allergic rhinitis due to pollen: Secondary | ICD-10-CM

## 2023-11-29 MED ORDER — ATORVASTATIN CALCIUM 40 MG PO TABS
40.0000 mg | ORAL_TABLET | Freq: Every day | ORAL | 1 refills | Status: DC
  Filled 2023-11-29: qty 90, 90d supply, fill #0
  Filled 2024-02-26: qty 90, 90d supply, fill #1

## 2023-11-29 MED ORDER — MONTELUKAST SODIUM 10 MG PO TABS
10.0000 mg | ORAL_TABLET | Freq: Every day | ORAL | 1 refills | Status: DC
  Filled 2023-11-29: qty 90, 90d supply, fill #0
  Filled 2024-02-26: qty 90, 90d supply, fill #1

## 2023-11-30 ENCOUNTER — Other Ambulatory Visit: Payer: Self-pay

## 2023-12-14 ENCOUNTER — Other Ambulatory Visit (HOSPITAL_COMMUNITY): Payer: Self-pay

## 2023-12-18 ENCOUNTER — Other Ambulatory Visit (HOSPITAL_COMMUNITY): Payer: Self-pay

## 2024-01-12 ENCOUNTER — Other Ambulatory Visit (HOSPITAL_COMMUNITY): Payer: Self-pay

## 2024-02-02 ENCOUNTER — Other Ambulatory Visit: Payer: Self-pay | Admitting: Obstetrics and Gynecology

## 2024-02-02 ENCOUNTER — Other Ambulatory Visit (HOSPITAL_COMMUNITY): Payer: Self-pay

## 2024-02-02 DIAGNOSIS — Z1231 Encounter for screening mammogram for malignant neoplasm of breast: Secondary | ICD-10-CM

## 2024-02-24 ENCOUNTER — Encounter: Payer: Self-pay | Admitting: Internal Medicine

## 2024-02-28 ENCOUNTER — Other Ambulatory Visit (HOSPITAL_COMMUNITY): Payer: Self-pay

## 2024-02-28 ENCOUNTER — Telehealth: Payer: Self-pay | Admitting: Internal Medicine

## 2024-02-28 ENCOUNTER — Telehealth: Payer: Self-pay

## 2024-02-28 NOTE — Telephone Encounter (Signed)
 Copied from CRM [id number]. Topic: Clinical - Medication Prior Auth >> Feb 28, 2024  8:36 AM Deaijah H wrote: Reason for CRM: Patient called in wanting to know if Prior authorization has been sent to [facility] for medication accufer, and if not may Dr. Joshua nurse can go ahead and get it started. Would like confirmation sent via MyChart

## 2024-02-28 NOTE — Telephone Encounter (Signed)
 Please advise. She needs a PA on her Accufer.

## 2024-02-28 NOTE — Telephone Encounter (Signed)
 Pharmacy Patient Advocate Encounter   Received notification from Pt Calls Messages that prior authorization for Accrufer  30mg  caps is required/requested.   Insurance verification completed.   The patient is insured through [facility] .   Per test claim: Accrufer  30mg  caps is not covered by plan. Drug excluded from coverage.    Prior authorization not submitted due to plan exclusion.

## 2024-02-29 NOTE — Telephone Encounter (Signed)
 Patient has been made aware via VOICEMAIL.

## 2024-03-07 ENCOUNTER — Other Ambulatory Visit: Payer: Self-pay | Admitting: Internal Medicine

## 2024-03-07 ENCOUNTER — Other Ambulatory Visit (HOSPITAL_COMMUNITY): Payer: Self-pay

## 2024-03-07 ENCOUNTER — Other Ambulatory Visit: Payer: Self-pay

## 2024-03-07 DIAGNOSIS — I10 Essential (primary) hypertension: Secondary | ICD-10-CM

## 2024-03-07 DIAGNOSIS — R000 Tachycardia, unspecified: Secondary | ICD-10-CM

## 2024-03-09 ENCOUNTER — Other Ambulatory Visit (HOSPITAL_COMMUNITY): Payer: Self-pay

## 2024-03-11 ENCOUNTER — Other Ambulatory Visit (HOSPITAL_COMMUNITY): Payer: Self-pay

## 2024-03-11 ENCOUNTER — Other Ambulatory Visit: Payer: Self-pay

## 2024-03-11 DIAGNOSIS — R000 Tachycardia, unspecified: Secondary | ICD-10-CM

## 2024-03-11 DIAGNOSIS — I10 Essential (primary) hypertension: Secondary | ICD-10-CM

## 2024-03-11 MED ORDER — NEBIVOLOL HCL 10 MG PO TABS
10.0000 mg | ORAL_TABLET | Freq: Every day | ORAL | 1 refills | Status: DC
  Filled 2024-03-11: qty 90, 90d supply, fill #0
  Filled 2024-06-04: qty 90, 90d supply, fill #1

## 2024-04-04 ENCOUNTER — Encounter: Payer: Self-pay | Admitting: Internal Medicine

## 2024-04-04 ENCOUNTER — Ambulatory Visit: Payer: Commercial Managed Care - PPO | Admitting: Internal Medicine

## 2024-04-04 ENCOUNTER — Other Ambulatory Visit: Payer: Self-pay | Admitting: Interventional Radiology

## 2024-04-04 VITALS — BP 138/92 | HR 89 | Temp 98.4°F | Resp 16 | Ht 64.0 in | Wt 174.0 lb

## 2024-04-04 DIAGNOSIS — I10 Essential (primary) hypertension: Secondary | ICD-10-CM

## 2024-04-04 DIAGNOSIS — G4733 Obstructive sleep apnea (adult) (pediatric): Secondary | ICD-10-CM | POA: Diagnosis not present

## 2024-04-04 DIAGNOSIS — Z0000 Encounter for general adult medical examination without abnormal findings: Secondary | ICD-10-CM | POA: Diagnosis not present

## 2024-04-04 DIAGNOSIS — D510 Vitamin B12 deficiency anemia due to intrinsic factor deficiency: Secondary | ICD-10-CM | POA: Diagnosis not present

## 2024-04-04 DIAGNOSIS — E519 Thiamine deficiency, unspecified: Secondary | ICD-10-CM

## 2024-04-04 DIAGNOSIS — D500 Iron deficiency anemia secondary to blood loss (chronic): Secondary | ICD-10-CM | POA: Diagnosis not present

## 2024-04-04 DIAGNOSIS — R739 Hyperglycemia, unspecified: Secondary | ICD-10-CM | POA: Diagnosis not present

## 2024-04-04 DIAGNOSIS — Z0001 Encounter for general adult medical examination with abnormal findings: Secondary | ICD-10-CM | POA: Insufficient documentation

## 2024-04-04 DIAGNOSIS — D259 Leiomyoma of uterus, unspecified: Secondary | ICD-10-CM

## 2024-04-04 LAB — BASIC METABOLIC PANEL WITH GFR
BUN: 8 mg/dL (ref 6–23)
CO2: 24 meq/L (ref 19–32)
Calcium: 9.1 mg/dL (ref 8.4–10.5)
Chloride: 104 meq/L (ref 96–112)
Creatinine, Ser: 0.93 mg/dL (ref 0.40–1.20)
GFR: 73.67 mL/min (ref >60.00)
Glucose, Bld: 96 mg/dL (ref 70–99)
Potassium: 3.8 meq/L (ref 3.5–5.1)
Sodium: 138 meq/L (ref 135–145)

## 2024-04-04 LAB — CBC WITH DIFFERENTIAL/PLATELET
Basophils Absolute: 0 K/uL (ref 0.0–0.1)
Basophils Relative: 0.5 % (ref 0.0–3.0)
Eosinophils Absolute: 0.1 K/uL (ref 0.0–0.7)
Eosinophils Relative: 2.9 % (ref 0.0–5.0)
HCT: 39.3 % (ref 36.0–46.0)
Hemoglobin: 12.7 g/dL (ref 12.0–15.0)
Lymphocytes Relative: 37 % (ref 12.0–46.0)
Lymphs Abs: 1.7 K/uL (ref 0.7–4.0)
MCHC: 32.3 g/dL (ref 30.0–36.0)
MCV: 90.2 fl (ref 78.0–100.0)
Monocytes Absolute: 0.4 K/uL (ref 0.1–1.0)
Monocytes Relative: 8.7 % (ref 3.0–12.0)
Neutro Abs: 2.3 K/uL (ref 1.4–7.7)
Neutrophils Relative %: 50.9 % (ref 43.0–77.0)
Platelets: 250 K/uL (ref 150.0–400.0)
RBC: 4.36 Mil/uL (ref 3.87–5.11)
RDW: 16.6 % — ABNORMAL HIGH (ref 11.5–15.5)
WBC: 4.5 K/uL (ref 4.0–10.5)

## 2024-04-04 LAB — IBC + FERRITIN
Ferritin: 14.2 ng/mL (ref 10.0–291.0)
Iron: 66 ug/dL (ref 42–145)
Saturation Ratios: 18.3 % — ABNORMAL LOW (ref 20.0–50.0)
TIBC: 359.8 ug/dL (ref 250.0–450.0)
Transferrin: 257 mg/dL (ref 212.0–360.0)

## 2024-04-04 LAB — HEMOGLOBIN A1C: Hgb A1c MFr Bld: 6 % (ref 4.6–6.5)

## 2024-04-04 LAB — TSH: TSH: 1.91 u[IU]/mL (ref 0.35–5.50)

## 2024-04-04 NOTE — Progress Notes (Unsigned)
 Subjective:  Patient ID: Rojean DELENA Hurst, female    DOB: 10/11/1975  Age: 48 y.o. MRN: [id number]  CC: Annual Exam, Hypertension, and Anemia   HPI JUNELLA DOMKE presents for a CPX and f/up ---  Discussed the use of AI scribe software for clinical note transcription with the patient, who gave verbal consent to proceed.  History of Present Illness KHILA PAPP is a 48 year old female with hypertension and iron deficiency anemia who presents with concerns about high blood pressure and anemia management.  She is concerned about her blood pressure, describing it as high, with recent readings such as 138/92 mmHg. No headache, blurred vision, chest pain, shortness of breath, palpitations, dizziness, lightheadedness, or swelling in her legs or feet. She is currently taking nebivolol , though the dosage is unspecified.  She manages iron deficiency anemia with an iron supplement once daily due to cost constraints. She feels more energetic when taking the supplement and notices decreased energy when missing a dose. She has a history of menstrual cycles that last five to six days, with her most recent cycle starting on August 13th. She has a uterine fibroid. Previously, she took the supplement twice daily but reduced the dosage when her iron levels were high.  She has a uterine fibroid, approximately ten centimeters in size, located on one side of the uterus. Financial constraints have delayed the procedure.  She had a colonoscopy in April and is scheduled for a mammogram next week. She works for [employer] and believes she has been vaccinated against hepatitis A and B.     Outpatient Medications Prior to Visit  Medication Sig Dispense Refill   aspirin  EC 81 MG tablet Take 1 tablet (81 mg total) by mouth daily. Swallow whole. 30 tablet 12   atorvastatin  (LIPITOR) 40 MG tablet Take 1 tablet (40 mg total) by mouth at bedtime. 90 tablet 1   bisacodyl  (DULCOLAX) 5 MG EC tablet Take by mouth as directed. 4  tablet 0   Cholecalciferol  50 MCG (2000 UT) TABS Take 1 tablet (2,000 Units total) by mouth daily. 90 tablet 1   Ferric Maltol  (ACCRUFER ) 30 MG CAPS Take 1 capsule (30 mg total) by mouth in the morning and at bedtime. 180 capsule 1   fluticasone  (FLONASE ) 50 MCG/ACT nasal spray Place 2 sprays into both nostrils daily. 48 g 1   levocetirizine (XYZAL ) 5 MG tablet Take 1 tablet (5 mg total) by mouth every evening. 90 tablet 1   Misc Natural Products (BEET ROOT) 500 MG CAPS Take 500 mg by mouth daily.     montelukast  (SINGULAIR ) 10 MG tablet Take 1 tablet (10 mg total) by mouth at bedtime. 90 tablet 1   nebivolol  (BYSTOLIC ) 10 MG tablet Take 1 tablet (10 mg total) by mouth daily. 90 tablet 1   pantoprazole  (PROTONIX ) 40 MG tablet Take 1 tablet (40 mg total) by mouth 2 (two) times daily before a meal for 1 month.  Then reduce to 1 tablet (40mg ) by mouth once daily 90 tablet 3   polyethylene glycol-electrolytes (NULYTELY) 420 g solution Take as directed per instructions. 4000 mL 0   thiamine  (VITAMIN B-1) 50 MG tablet Take 1 tablet by mouth daily. 90 tablet 1   tranexamic acid  (LYSTEDA ) 650 MG TABS tablet Take 2 tablets by mouth 3 times a day for 5 days. (Patient not taking: Reported on 04/04/2024) 30 tablet 11   No facility-administered medications prior to visit.    ROS Review of Systems  Constitutional:  Negative for appetite change, chills, diaphoresis, fatigue and fever.  Respiratory:  Positive for apnea. Negative for cough, chest tightness, shortness of breath and wheezing.   Cardiovascular:  Negative for chest pain, palpitations and leg swelling.  Gastrointestinal:  Negative for abdominal pain, constipation, diarrhea, nausea and vomiting.  Genitourinary:  Positive for menstrual problem and vaginal bleeding. Negative for difficulty urinating.  Musculoskeletal: Negative.   Neurological: Negative.  Negative for dizziness and weakness.  Hematological:  Negative for adenopathy. Does not  bruise/bleed easily.  Psychiatric/Behavioral: Negative.      Objective:  BP (!) 138/92 (BP Location: Left Arm, Patient Position: Sitting, Cuff Size: Normal)   Pulse 89   Temp 98.4 F (36.9 C) (Oral)   Resp 16   Ht 5' 4 (1.626 m)   Wt 174 lb (78.9 kg)   LMP 03/21/2024 (Exact Date)   SpO2 99%   BMI 29.87 kg/m   BP Readings from Last 3 Encounters:  04/04/24 (!) 138/92  09/29/23 108/72  08/28/23 132/88    Wt Readings from Last 3 Encounters:  04/04/24 174 lb (78.9 kg)  09/29/23 175 lb (79.4 kg)  08/28/23 175 lb 12.8 oz (79.7 kg)    Physical Exam Vitals reviewed.  Constitutional:      Appearance: Normal appearance.  HENT:     Nose: Nose normal.  Eyes:     General: No scleral icterus.    Conjunctiva/sclera: Conjunctivae normal.  Cardiovascular:     Rate and Rhythm: Normal rate and regular rhythm.     Heart sounds: No murmur heard.    No friction rub. No gallop.  Pulmonary:     Breath sounds: No stridor. No wheezing, rhonchi or rales.  Abdominal:     General: Abdomen is protuberant. There is no distension.     Palpations: There is mass. There is no hepatomegaly or splenomegaly.     Tenderness: There is no abdominal tenderness. There is no guarding or rebound.  Musculoskeletal:        General: Normal range of motion.     Cervical back: Neck supple.     Right lower leg: No edema.     Left lower leg: No edema.  Lymphadenopathy:     Cervical: No cervical adenopathy.  Skin:    General: Skin is warm and dry.  Neurological:     General: No focal deficit present.     Mental Status: She is alert. Mental status is at baseline.  Psychiatric:        Mood and Affect: Mood normal.        Behavior: Behavior normal.     Lab Results  Component Value Date   WBC 4.5 04/04/2024   HGB 12.7 04/04/2024   HCT 39.3 04/04/2024   PLT 250.0 04/04/2024   GLUCOSE 96 04/04/2024   CHOL 133 10/18/2023   TRIG 51 10/18/2023   HDL 65 10/18/2023   LDLDIRECT 134.7 05/10/2013   LDLCALC  56 10/18/2023   ALT 26 10/18/2023   AST 25 10/18/2023   NA 138 04/04/2024   K 3.8 04/04/2024   CL 104 04/04/2024   CREATININE 0.93 04/04/2024   BUN 8 04/04/2024   CO2 24 04/04/2024   TSH 1.91 04/04/2024   HGBA1C 6.0 04/04/2024    CT CORONARY MORPH W/CTA COR W/SCORE W/CA W/CM &/OR WO/CM Addendum Date: 09/02/2023 ADDENDUM REPORT: 09/02/2023 10:31 EXAM: OVER-READ INTERPRETATION  CT CHEST The following report is an over-read performed by radiologist Dr. Andrea Gasman of [facility] on  09/02/2023. This over-read does not include interpretation of cardiac or coronary anatomy or pathology. The coronary CTA interpretation by the cardiologist is attached. COMPARISON:  Chest CTA 07/27/2023 FINDINGS: Vascular: No aortic atherosclerosis. The included aorta is normal in caliber. Mediastinum/nodes: No adenopathy or mass. Unremarkable esophagus. Lungs: No focal airspace disease. No pulmonary nodule. No pleural fluid. The included airways are patent. Upper abdomen: No acute or unexpected findings. Musculoskeletal: There are no acute or suspicious osseous abnormalities. IMPRESSION: No acute or unexpected extracardiac findings. Electronically Signed   By: Andrea Gasman M.D.   On: 09/02/2023 10:31   Result Date: 09/02/2023 HISTORY: 48 yo female with precordial pain EXAM: Cardiac/Coronary CTA TECHNIQUE: The patient was scanned on a [employer]. PROTOCOL: A 100 kV prospective scan was triggered in the descending thoracic aorta at 111 HU's. Axial non-contrast 3 mm slices were carried out through the heart. The data set was analyzed on a dedicated work station and scored using the Agatson method. Gantry rotation speed was 250 msecs and collimation was .6 mm. Beta blockade and 0.8 mg of sl NTG was given. The 3D data set was reconstructed in 5% intervals of the 35-75 % of the R-R cycle. Diastolic phases were analyzed on a dedicated work station using MPR, MIP and VRT modes. The patient received  95mL OMNIPAQUE  IOHEXOL  350 MG/ML SOLN contrast. FINDINGS: Quality: Good, HR 69 Coronary calcium  score: The patient's coronary artery calcium  score is 0. Coronary arteries: Normal coronary origins.  Right dominance. Right Coronary Artery: Dominant. No disease. Normal R-PLB and R-PDA branches. Left Main Coronary Artery: Normal. Bifurcates into the LAD and LCx arteries. Left Anterior Descending Coronary Artery: Large anterior artery that wraps around the apex. No disease. Larger D1 and smaller D2 branches, no disease. Left Circumflex Artery: AV groove vessel, no disease. Aorta: Normal size, 35 mm at the mid ascending aorta (level of the PA bifurcation) measured double oblique. No calcifications. No dissection. Aortic Valve: Trileaflet. No calcifications. Other findings: Normal pulmonary vein drainage into the left atrium. Normal left atrial appendage without a thrombus. Dilated main pulmonary artery to 33 mm, suggestive of pulmonary hypertension. IMPRESSION: 1. No evidence of CAD, CADRADS = 0. 2. Coronary artery calcium  score is 0. 3. Normal coronary origin with right dominance. 4. Dilated main pulmonary artery to 33 mm, suggestive of pulmonary hypertension. 5. Consider non-coronary causes of chest pain. Electronically Signed: By: Vinie JAYSON Maxcy M.D. On: 08/28/2023 09:35    Assessment & Plan:   Iron deficiency anemia due to chronic blood loss- H/H and iron are normal. -     IBC + Ferritin; Future -     CBC with Differential/Platelet; Future  Vitamin B12 deficiency anemia due to intrinsic factor deficiency -     CBC with Differential/Platelet; Future  Primary hypertension- She has not achieved her BP goal. -     TSH; Future -     Basic metabolic panel with GFR; Future -     amLODIPine  Besylate; Take 1 tablet (5 mg total) by mouth daily.  Dispense: 90 tablet; Refill: 0 -     AMB Referral VBCI Care Management  Encounter for general adult medical examination with abnormal findings- Exam completed, labs  reviewed, vaccines reviewed, cancer screenings are UTD, pt ed material was given.   Chronic hyperglycemia -     Hemoglobin A1c; Future  OSA (obstructive sleep apnea) -     Zepbound ; Inject 2.5 mg into the skin once a week.  Dispense: 2 mL; Refill: 0 -  AMB Referral VBCI Care Management     Follow-up: Return in about 6 months (around 10/05/2024).  Debby Molt, MD

## 2024-04-04 NOTE — Patient Instructions (Signed)

## 2024-04-05 ENCOUNTER — Other Ambulatory Visit (HOSPITAL_COMMUNITY): Payer: Self-pay

## 2024-04-05 ENCOUNTER — Other Ambulatory Visit: Payer: Self-pay

## 2024-04-05 ENCOUNTER — Ambulatory Visit: Payer: Self-pay | Admitting: Internal Medicine

## 2024-04-05 MED ORDER — AMLODIPINE BESYLATE 5 MG PO TABS
5.0000 mg | ORAL_TABLET | Freq: Every day | ORAL | 0 refills | Status: DC
  Filled 2024-04-05: qty 90, 90d supply, fill #0

## 2024-04-05 MED ORDER — ZEPBOUND 2.5 MG/0.5ML ~~LOC~~ SOAJ
2.5000 mg | SUBCUTANEOUS | 0 refills | Status: DC
  Filled 2024-04-05: qty 2, 28d supply, fill #0

## 2024-04-06 ENCOUNTER — Other Ambulatory Visit (HOSPITAL_COMMUNITY): Payer: Self-pay

## 2024-04-10 ENCOUNTER — Ambulatory Visit
Admission: RE | Admit: 2024-04-10 | Discharge: 2024-04-10 | Disposition: A | Discharge status: Home / Self Care | Source: Ambulatory Visit | Attending: Obstetrics and Gynecology | Admitting: Obstetrics and Gynecology

## 2024-04-10 DIAGNOSIS — Z1231 Encounter for screening mammogram for malignant neoplasm of breast: Secondary | ICD-10-CM

## 2024-04-11 ENCOUNTER — Ambulatory Visit: Payer: Commercial Managed Care - PPO | Admitting: Internal Medicine

## 2024-04-18 ENCOUNTER — Other Ambulatory Visit: Payer: Self-pay | Admitting: Internal Medicine

## 2024-04-18 ENCOUNTER — Telehealth: Payer: Self-pay | Admitting: *Deleted

## 2024-04-18 DIAGNOSIS — J301 Allergic rhinitis due to pollen: Secondary | ICD-10-CM

## 2024-04-18 NOTE — Progress Notes (Signed)
 Care Guide Pharmacy Note  04/18/2024 Name: [name] MRN: [id number] DOB: [birth year]  Referred By: Joshua Debby CROME, MD Reason for referral: Complex Care Management (Outreach to schedule referral with pharmacist )   Felicia Johnson is a 48 y.o. year old female who is a primary care patient of Joshua Debby CROME, MD.  Rojean DELENA Hurst was referred to the pharmacist for assistance related to: HTN  Successful contact was made with the patient to discuss pharmacy services including being ready for the pharmacist to call at least 5 minutes before the scheduled appointment time and to have medication bottles and any blood pressure readings ready for review. The patient agreed to meet with the pharmacist via telephone visit on 05/03/2024  Thedford Franks, CMA [place]  [facility], [place] Guide Direct Dial: [phone]  Fax: [phone] Website: [email or web address]

## 2024-04-19 ENCOUNTER — Other Ambulatory Visit: Payer: Self-pay

## 2024-04-19 ENCOUNTER — Other Ambulatory Visit (HOSPITAL_COMMUNITY): Payer: Self-pay

## 2024-04-19 MED ORDER — FLUTICASONE PROPIONATE 50 MCG/ACT NA SUSP
2.0000 | Freq: Every day | NASAL | 1 refills | Status: AC
  Filled 2024-04-19: qty 48, 90d supply, fill #0
  Filled 2024-07-26: qty 48, 90d supply, fill #1

## 2024-04-20 ENCOUNTER — Other Ambulatory Visit (HOSPITAL_COMMUNITY): Payer: Self-pay

## 2024-04-22 ENCOUNTER — Ambulatory Visit (INDEPENDENT_AMBULATORY_CARE_PROVIDER_SITE_OTHER): Admitting: Adult Health

## 2024-04-22 ENCOUNTER — Ambulatory Visit: Admitting: Adult Health

## 2024-04-22 VITALS — BP 116/76 | HR 80 | Ht 64.0 in | Wt 175.0 lb

## 2024-04-22 DIAGNOSIS — G4733 Obstructive sleep apnea (adult) (pediatric): Secondary | ICD-10-CM | POA: Diagnosis not present

## 2024-04-22 NOTE — Progress Notes (Signed)
 PATIENT: Felicia Johnson DOB: 1975/12/21  REASON FOR VISIT: follow up HISTORY FROM: patient PRIMARY NEUROLOGIST: Dr. Buck  Chief Complaint  Patient presents with   Follow-up    Rm 4, alone.  Cpap follow up.       HISTORY OF PRESENT ILLNESS: Today 04/22/24:  Felicia Johnson is a 48 y.o. female with a history of OSA on CPAP. Returns today for follow-up.  She reports that CPAP is working well for her.  She continues to have a Industrial/product designer.  She would like to get her old machine.  She prefers to stay with the ResMed 10.  She feels that CPAP is working well for her.  Currently has the DreamWear mask and finds it beneficial.  Her download is below.        REVIEW OF SYSTEMS: Out of a complete 14 system review of symptoms, the patient complains only of the following symptoms, and all other reviewed systems are negative.   ALLERGIES: Allergies  Allergen Reactions   Latex Itching    HOME MEDICATIONS: Outpatient Medications Prior to Visit  Medication Sig Dispense Refill   amLODipine  (NORVASC ) 5 MG tablet Take 1 tablet (5 mg total) by mouth daily. 90 tablet 0   aspirin  EC 81 MG tablet Take 1 tablet (81 mg total) by mouth daily. Swallow whole. 30 tablet 12   atorvastatin  (LIPITOR) 40 MG tablet Take 1 tablet (40 mg total) by mouth at bedtime. 90 tablet 1   Cholecalciferol  50 MCG (2000 UT) TABS Take 1 tablet (2,000 Units total) by mouth daily. 90 tablet 1   Ferric Maltol  (ACCRUFER ) 30 MG CAPS Take 1 capsule (30 mg total) by mouth in the morning and at bedtime. 180 capsule 1   fluticasone  (FLONASE ) 50 MCG/ACT nasal spray Place 2 sprays into both nostrils daily. 48 g 1   levocetirizine (XYZAL ) 5 MG tablet Take 1 tablet (5 mg total) by mouth every evening. 90 tablet 1   Misc Natural Products (BEET ROOT) 500 MG CAPS Take 500 mg by mouth daily.     montelukast  (SINGULAIR ) 10 MG tablet Take 1 tablet (10 mg total) by mouth at bedtime. 90 tablet 1   nebivolol  (BYSTOLIC ) 10 MG tablet Take 1  tablet (10 mg total) by mouth daily. 90 tablet 1   pantoprazole  (PROTONIX ) 40 MG tablet Take 1 tablet (40 mg total) by mouth 2 (two) times daily before a meal for 1 month.  Then reduce to 1 tablet (40mg ) by mouth once daily 90 tablet 3   thiamine  (VITAMIN B-1) 50 MG tablet Take 1 tablet by mouth daily. 90 tablet 1   tranexamic acid  (LYSTEDA ) 650 MG TABS tablet Take 2 tablets by mouth 3 times a day for 5 days. 30 tablet 11   bisacodyl  (DULCOLAX) 5 MG EC tablet Take by mouth as directed. 4 tablet 0   polyethylene glycol-electrolytes (NULYTELY) 420 g solution Take as directed per instructions. 4000 mL 0   tirzepatide  (ZEPBOUND ) 2.5 MG/0.5ML Pen Inject 2.5 mg into the skin once a week. 2 mL 0   No facility-administered medications prior to visit.    PAST MEDICAL HISTORY: Past Medical History:  Diagnosis Date   Acid reflux    Anemia    Hyperlipidemia     PAST SURGICAL HISTORY: Past Surgical History:  Procedure Laterality Date   BREAST CYST EXCISION Right 2004   TUBAL LIGATION      FAMILY HISTORY: Family History  Problem Relation Age of Onset  Hypertension Mother    Hypertension Sister    Anemia Sister    Atrial fibrillation Maternal Grandfather    Alzheimer's disease Paternal Grandmother    Stroke Other    Cancer Neg Hx    Heart disease Neg Hx    Hyperlipidemia Neg Hx    Diabetes Neg Hx    Drug abuse Neg Hx    Alcohol abuse Neg Hx    Kidney disease Neg Hx    Breast cancer Neg Hx    BRCA 1/2 Neg Hx     SOCIAL HISTORY: Social History   Socioeconomic History   Marital status: Married    Spouse name: Not on file   Number of children: Not on file   Years of education: Not on file   Highest education level: Not on file  Occupational History   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[employer]  Tobacco Use   Smoking status: Never    Passive exposure: Never   Smokeless tobacco: Never  Vaping Use   Vaping status: Never Used  Substance and Sexual Activity   Alcohol use: Yes    Comment:  ocassional   Drug use: Never   Sexual activity: Yes    Partners: Male    Birth control/protection: Surgical  Other Topics Concern   Not on file  Social History Narrative   Regular Exercise -  NO   Caffiene; gingerale   Lives home with children (3) and husband   Social Drivers of Corporate investment banker Strain: Not on file  Food Insecurity: Not on file  Transportation Needs: Not on file  Physical Activity: Not on file  Stress: Not on file  Social Connections: Not on file  Intimate Partner Violence: Not on file      PHYSICAL EXAM  Vitals:   04/22/24 1503  BP: 116/76  Pulse: 80  SpO2: 97%  Weight: 175 lb (79.4 kg)  Height: 5' 4 (1.626 m)   Body mass index is 30.04 kg/m.  Generalized: Well developed, in no acute distress  Chest: Lungs clear to auscultation bilaterally  Neurological examination  Mentation: Alert oriented to time, place, history taking. Follows all commands speech and language fluent Cranial nerve II-XII: Facial symmetry noted   DIAGNOSTIC DATA (LABS, IMAGING, TESTING) - I reviewed patient records, labs, notes, testing and imaging myself where available.  Lab Results  Component Value Date   WBC 4.5 04/04/2024   HGB 12.7 04/04/2024   HCT 39.3 04/04/2024   MCV 90.2 04/04/2024   PLT 250.0 04/04/2024      Component Value Date/Time   NA 138 04/04/2024 0934   NA 141 08/10/2023 0936   K 3.8 04/04/2024 0934   CL 104 04/04/2024 0934   CO2 24 04/04/2024 0934   GLUCOSE 96 04/04/2024 0934   BUN 8 04/04/2024 0934   BUN 10 08/10/2023 0936   CREATININE 0.93 04/04/2024 0934   CALCIUM  9.1 04/04/2024 0934   PROT 7.1 10/18/2023 0828   ALBUMIN 4.2 10/18/2023 0828   AST 25 10/18/2023 0828   ALT 26 10/18/2023 0828   ALKPHOS 47 10/18/2023 0828   BILITOT 0.3 10/18/2023 0828   GFRNONAA >60 07/28/2023 0508   GFRAA >60 08/10/2017 0939   Lab Results  Component Value Date   CHOL 133 10/18/2023   HDL 65 10/18/2023   LDLCALC 56 10/18/2023    LDLDIRECT 134.7 05/10/2013   TRIG 51 10/18/2023   CHOLHDL 2.0 10/18/2023   Lab Results  Component Value Date   HGBA1C 6.0 04/04/2024  Lab Results  Component Value Date   VITAMINB12 687 08/28/2023   Lab Results  Component Value Date   TSH 1.91 04/04/2024      ASSESSMENT AND PLAN 48 y.o. year old female  has a past medical history of Acid reflux, Anemia, and Hyperlipidemia. here with:  OSA on CPAP  - CPAP compliance excellent - Good treatment of AHI  - Encourage patient to use CPAP nightly and > 4 hours each night - F/U in 1 year or sooner if needed   Orders Placed This Encounter  Procedures   For home use only DME continuous positive airway pressure (CPAP)    RESMED 10 with modem. 5-10 cm EPR 3, dreamwear mask, with humidification    Length of Need:   12 Months    Patient has OSA or probable OSA:   Yes    Is the patient currently using CPAP in the home:   Yes    Settings:   Other see comments    CPAP supplies needed:   Mask, headgear, cushions, filters, heated tubing and water chamber      Duwaine Russell, MSN, NP-C 04/22/2024, 3:11 PM [facility] [address] [place], [place] [address] [phone]

## 2024-04-22 NOTE — Progress Notes (Signed)
Order sent to [facility].

## 2024-04-22 NOTE — Patient Instructions (Addendum)
 Your Plan:  Continue using CPAP nightly and greater than 4 hours each night Order placed for new machine If your symptoms worsen or you develop new symptoms please let us  know.    Thank you for coming to see us  at [facility]. I hope we have been able to provide you high quality care today.  You may receive a patient satisfaction survey over the next few weeks. We would appreciate your feedback and comments so that we may continue to improve ourselves and the health of our patients.

## 2024-04-24 NOTE — Progress Notes (Signed)
 New, Maryella Shivers, Otilio Jefferson, RN; Alain Honey; Jeris Penta, [place]; 1 other Received, thank you!

## 2024-04-26 DIAGNOSIS — Z01419 Encounter for gynecological examination (general) (routine) without abnormal findings: Secondary | ICD-10-CM | POA: Diagnosis not present

## 2024-04-26 DIAGNOSIS — Z124 Encounter for screening for malignant neoplasm of cervix: Secondary | ICD-10-CM | POA: Diagnosis not present

## 2024-05-01 ENCOUNTER — Other Ambulatory Visit: Payer: Self-pay | Admitting: Internal Medicine

## 2024-05-01 DIAGNOSIS — J301 Allergic rhinitis due to pollen: Secondary | ICD-10-CM

## 2024-05-02 ENCOUNTER — Other Ambulatory Visit (HOSPITAL_COMMUNITY): Payer: Self-pay

## 2024-05-02 MED ORDER — LEVOCETIRIZINE DIHYDROCHLORIDE 5 MG PO TABS
5.0000 mg | ORAL_TABLET | Freq: Every evening | ORAL | 1 refills | Status: AC
  Filled 2024-05-02: qty 90, 90d supply, fill #0
  Filled 2024-08-01: qty 90, 90d supply, fill #1

## 2024-05-03 ENCOUNTER — Other Ambulatory Visit (INDEPENDENT_AMBULATORY_CARE_PROVIDER_SITE_OTHER): Admitting: Pharmacist

## 2024-05-03 DIAGNOSIS — I10 Essential (primary) hypertension: Secondary | ICD-10-CM

## 2024-05-03 DIAGNOSIS — G4733 Obstructive sleep apnea (adult) (pediatric): Secondary | ICD-10-CM

## 2024-05-03 NOTE — Patient Instructions (Signed)
 It was a pleasure speaking with you today!  Continue your current regimen.  Feel free to call with any questions or concerns!  Arbutus Leas, PharmD, BCPS, CPP Clinical Pharmacist Practitioner [facility] [phone]

## 2024-05-03 NOTE — Progress Notes (Signed)
 05/03/2024 Name: Felicia Johnson MRN: [id number] DOB: 07/04/1976  Chief Complaint  Patient presents with   Hypertension   Medication Assistance    Felicia Johnson is a 48 y.o. year old female who presented for a telephone visit.   They were referred to the pharmacist by their PCP for assistance in managing hypertension, medication access, and OSA.    Subjective:  Care Team: Primary Care Provider: Joshua Debby CROME, MD ; Next Scheduled Visit: 10/10/24  Medication Access/Adherence  Current Pharmacy:  Felicia Johnson - [facility] [address] [place] [address] Phone: [phone] Fax: [phone]  [facility] [place], [place] - [address] [phone] [place] [address] Phone: [phone] Fax: [phone]   Patient reports affordability concerns with their medications: No  Patient reports access/transportation concerns to their pharmacy: No  Patient reports adherence concerns with their medications:  No     Hypertension:  Current medications: nebivolol  10 mg daily, amlodipine  5 mg daily Medications previously tried:   Recently started amlodipine  and notes her Bps since have been very well controlled at neurology and OBGYN appts.   She does report occasionally dizziness but it is not hindering her daily activities.   OSA: Currently uses CPAP Had neuro follow up last week and notes that her CPAP is working very well and her OSA is at goal (AHI of 1) using her CPAP. She notes she is not interested in using Zepbound  for OSA and prefers to manage it with her CPAP   Objective:  Lab Results  Component Value Date   HGBA1C 6.0 04/04/2024    Lab Results  Component Value Date   CREATININE 0.93 04/04/2024   BUN 8 04/04/2024   NA 138 04/04/2024   K 3.8 04/04/2024   CL 104 04/04/2024   CO2 24 04/04/2024    Lab Results  Component Value Date   CHOL 133 10/18/2023   HDL 65 10/18/2023   LDLCALC 56 10/18/2023    LDLDIRECT 134.7 05/10/2013   TRIG 51 10/18/2023   CHOLHDL 2.0 10/18/2023    Medications Reviewed Today     Reviewed by Felicia Johnson, RPH (Pharmacist) on 05/03/24 at 1553  Med List Status: <None>   Medication Order Taking? Sig Documenting Provider Last Dose Status Informant  amLODipine  (NORVASC ) 5 MG tablet [id number] Yes Take 1 tablet (5 mg total) by mouth daily. Felicia Debby CROME, MD  Active   aspirin  EC 81 MG tablet [id number]  Take 1 tablet (81 mg total) by mouth daily. Swallow whole. Rai, Nydia POUR, MD  Active   atorvastatin  (LIPITOR) 40 MG tablet [id number] Yes Take 1 tablet (40 mg total) by mouth at bedtime. Felicia Debby CROME, MD  Active   Cholecalciferol  50 MCG (2000 UT) TABS [id number]  Take 1 tablet (2,000 Units total) by mouth daily. Felicia Debby CROME, MD  Active   Ferric Maltol  (ACCRUFER ) 30 MG CAPS [id number]  Take 1 capsule (30 mg total) by mouth in the morning and at bedtime. Felicia Debby CROME, MD  Active   fluticasone  (FLONASE ) 50 MCG/ACT nasal spray [id number]  Place 2 sprays into both nostrils daily. Felicia Debby CROME, MD  Active   levocetirizine (XYZAL ) 5 MG tablet [id number]  Take 1 tablet (5 mg total) by mouth every evening. Felicia Debby CROME, MD  Active   Misc Natural Products (BEET ROOT) 500 MG CAPS [id number]  Take 500 mg by mouth daily. [provider]  Active  montelukast  (SINGULAIR ) 10 MG tablet [id number]  Take 1 tablet (10 mg total) by mouth at bedtime. Felicia Debby CROME, MD  Active   nebivolol  (BYSTOLIC ) 10 MG tablet [id number] Yes Take 1 tablet (10 mg total) by mouth daily. Felicia Debby CROME, MD  Active   pantoprazole  (PROTONIX ) 40 MG tablet [id number]  Take 1 tablet (40 mg total) by mouth 2 (two) times daily before a meal for 1 month.  Then reduce to 1 tablet (40mg ) by mouth once daily Rai, Ripudeep K, MD  Active   thiamine  (VITAMIN B-1) 50 MG tablet [id number]  Take 1 tablet by mouth daily. Felicia Debby CROME, MD  Active   tranexamic acid  (LYSTEDA ) 650 MG TABS tablet  [id number]  Take 2 tablets by mouth 3 times a day for 5 days.   Active               Assessment/Plan:   Hypertension: - Currently controlled, BP goal <130/80 - Recommend to continue current regimen - Reviewed importance of standing up slowly to prevent dizziness. Contact office if dizziness worsens.      OSA: Currently controlled. Continue CPAP and neuro follow up for management Additionally, her insurance does not cover Zepbound  for OSA at this time.  Follow Up Plan: PRN   Darrelyn Drum, PharmD, BCPS, CPP Clinical Pharmacist Practitioner [facility] at [facility] [phone]

## 2024-05-17 ENCOUNTER — Other Ambulatory Visit

## 2024-05-18 ENCOUNTER — Other Ambulatory Visit: Payer: Self-pay

## 2024-05-22 ENCOUNTER — Other Ambulatory Visit: Payer: Self-pay | Admitting: Internal Medicine

## 2024-05-22 DIAGNOSIS — J301 Allergic rhinitis due to pollen: Secondary | ICD-10-CM

## 2024-05-23 ENCOUNTER — Other Ambulatory Visit: Payer: Self-pay | Admitting: Internal Medicine

## 2024-05-24 ENCOUNTER — Other Ambulatory Visit (HOSPITAL_COMMUNITY): Payer: Self-pay

## 2024-05-24 MED ORDER — MONTELUKAST SODIUM 10 MG PO TABS
10.0000 mg | ORAL_TABLET | Freq: Every day | ORAL | 1 refills | Status: AC
  Filled 2024-05-24: qty 90, 90d supply, fill #0
  Filled 2024-08-22: qty 90, 90d supply, fill #1

## 2024-05-24 MED ORDER — ATORVASTATIN CALCIUM 40 MG PO TABS
40.0000 mg | ORAL_TABLET | Freq: Every day | ORAL | 1 refills | Status: AC
  Filled 2024-05-24: qty 90, 90d supply, fill #0
  Filled 2024-08-22: qty 90, 90d supply, fill #1

## 2024-05-24 NOTE — Telephone Encounter (Signed)
Patient calling to check on refills

## 2024-05-25 ENCOUNTER — Encounter: Payer: Self-pay | Admitting: Internal Medicine

## 2024-05-25 ENCOUNTER — Other Ambulatory Visit: Payer: Self-pay | Admitting: Internal Medicine

## 2024-05-25 ENCOUNTER — Other Ambulatory Visit (HOSPITAL_COMMUNITY): Payer: Self-pay

## 2024-05-25 DIAGNOSIS — K2100 Gastro-esophageal reflux disease with esophagitis, without bleeding: Secondary | ICD-10-CM

## 2024-05-27 ENCOUNTER — Other Ambulatory Visit (HOSPITAL_COMMUNITY): Payer: Self-pay

## 2024-05-27 ENCOUNTER — Other Ambulatory Visit: Payer: Self-pay

## 2024-05-27 DIAGNOSIS — K2100 Gastro-esophageal reflux disease with esophagitis, without bleeding: Secondary | ICD-10-CM

## 2024-05-27 MED ORDER — PANTOPRAZOLE SODIUM 40 MG PO TBEC
40.0000 mg | DELAYED_RELEASE_TABLET | Freq: Two times a day (BID) | ORAL | 1 refills | Status: AC
  Filled 2024-05-27: qty 90, 60d supply, fill #0
  Filled 2024-07-26: qty 90, 60d supply, fill #1

## 2024-06-06 NOTE — Progress Notes (Unsigned)
 Cardiology Office Note    Date:  06/07/2024  ID:  Felicia Johnson, DOB 1976-07-15, MRN [id number] PCP:  Joshua Debby CROME, MD  Cardiologist:  Lurena MARLA Red, MD  Electrophysiologist:  None   Chief Complaint: Follow up for chest pain  History of Present Illness: .   Felicia Johnson is a 48 y.o. female with visit-pertinent history of hypertension, hyperlipidemia, GERD, degenerating uterine fibroid.  On 07/27/2023 she presented to the ED with an episode of upper back pressure and chest pressure.  Patient reported that she belched and felt better.  Her initial evaluation in the ED indicated blood pressure 116/68, oxygen 99% on room air, heart rate 70 bpm.  Her EKG showed normal sinus rhythm with a heart rate of 72 bpm.  High sensitive troponin 16>118>125>110.  Her chest x-ray showed no active cardiopulmonary disease, CTA of the chest/abdomen/pelvis showed no acute aortic syndrome, no evidence of PE, large 15.1 cm mass originating from the uterine wall with central necrosis, likely a degenerating fibroid.  On 12/19 while at work she had a sudden onset of a bandlike tightness across her chest and upper back, tightness did not radiate, she denied any associated shortness of breath or nausea.  A coworker checked her blood pressure which was reportedly normal.  She noted that she had a brief episode of diaphoresis.  On the way to the ER patient burped twice and her symptoms completely resolved.  She denied any recurrence of chest pain or tightness while at the hospital.  She denied any chest pain or shortness of breath prior to episode.  Patient was seen by Dr. Red, recommended outpatient coronary CTA.  Echocardiogram on 07/28/2023 indicated LVEF of 60 to 65%, no RWMA, diastolic parameters were normal, there were no significant valvular abnormalities.  Patient was seen in clinic on 08/10/2023 for follow-up.  She reported that she is doing well.  She had had 1 episode of upper/mid back pain after she was  discharged from the hospital associated diaphoresis, she took an additional Protonix  with improvement in her systems.  She felt this was gas pain.  Coronary CTA on 08/25/2023 indicated a coronary calcium  score of 0, with no evidence of CAD.  Her pulmonary artery was dilated to 33 mm, suggestive of pulmonary hypertension.  Patient was last seen in clinic on 09/29/2023 for follow-up.  She reported that she was doing very well, denied any episodes of chest pain, denies shortness of breath, extremity edema, palpitations, orthopnea or PND.  She was wearing her CPAP nightly and noted significant proved in prior palpitations.  She is exercising multiple times a week and tolerating well.  Today she presents for follow-up.  She reports that she has been doing well overall. She denies chest pain, shortness of breath, lower extremity edema.  She denies any palpitations, presyncope or syncope.  She reports that her previously noted palpitations have resolved with wearing of her CPAP machine.  She reports that she previously was regularly exercising has had a bit of the slowdown, plans to resume her regular exercise.  She notes that she is possibly undergoing a hysterectomy towards the end of the year, she would be acceptable risk for procedure, she is able to achieve greater than 4 METS of activity. ROS: .   Today she denies chest pain, shortness of breath, lower extremity edema, fatigue, palpitations, melena, hematuria, hemoptysis, diaphoresis, weakness, presyncope, syncope, orthopnea, and PND.  All other systems are reviewed and otherwise negative. Studies Reviewed: .  EKG:  EKG is ordered today, personally reviewed, demonstrating  EKG Interpretation Date/Time:  Friday June 07 2024 09:17:18 EDT Ventricular Rate:  71 PR Interval:  184 QRS Duration:  74 QT Interval:  402 QTC Calculation: 436 R Axis:   -13  Text Interpretation: Normal sinus rhythm Normal ECG Confirmed by Jaleya Pebley [phone]) on 06/07/2024  9:23:31 AM   CV Studies: Cardiac studies reviewed are outlined and summarized above. Otherwise please see EMR for full report. Cardiac Studies & Procedures   ______________________________________________________________________________________________     ECHOCARDIOGRAM  ECHOCARDIOGRAM COMPLETE 07/28/2023  Narrative ECHOCARDIOGRAM REPORT    [name] Date of Exam: 07/28/2023 Medical Rec #:  [id number]     Height:       64.0 in Accession #:    [id number]    Weight:       174.0 lb Date of Birth:  09/21/1975     BSA:          1.844 m Patient Age:    47 years      BP:           111/53 mmHg Patient Gender: F             HR:           73 bpm. Exam Location:  Inpatient  Procedure: Cardiac Doppler, Color Doppler, 3D Echo and 2D Echo  Indications:    Chest Pain R07.9  History:        Patient has no prior history of Echocardiogram examinations. Arrythmias:Tachycardia, Signs/Symptoms:Chest Pain; Risk Factors:Hypertension, Sleep Apnea and Dyslipidemia.  Sonographer:    Thea Norlander RCS Referring Phys: [id number] SARA-MAIZ A THOMAS  IMPRESSIONS   1. Left ventricular ejection fraction, by estimation, is 60 to 65%. Left ventricular ejection fraction by 3D volume is 67 %. The left ventricle has normal function. The left ventricle has no regional wall motion abnormalities. Left ventricular diastolic parameters were normal. 2. Right ventricular systolic function is normal. The right ventricular size is normal. There is normal pulmonary artery systolic pressure. The estimated right ventricular systolic pressure is 24.0 mmHg. 3. The mitral valve is normal in structure. Trivial mitral valve regurgitation. No evidence of mitral stenosis. 4. The aortic valve is tricuspid. Aortic valve regurgitation is trivial. No aortic stenosis is present. 5. The inferior vena cava is normal in size with greater than 50% respiratory variability, suggesting right atrial pressure of 3  mmHg.  FINDINGS Left Ventricle: Left ventricular ejection fraction, by estimation, is 60 to 65%. Left ventricular ejection fraction by 3D volume is 67 %. The left ventricle has normal function. The left ventricle has no regional wall motion abnormalities. The left ventricular internal cavity size was normal in size. There is no left ventricular hypertrophy. Left ventricular diastolic parameters were normal.  Right Ventricle: The right ventricular size is normal. No increase in right ventricular wall thickness. Right ventricular systolic function is normal. There is normal pulmonary artery systolic pressure. The tricuspid regurgitant velocity is 2.29 m/s, and with an assumed right atrial pressure of 3 mmHg, the estimated right ventricular systolic pressure is 24.0 mmHg.  Left Atrium: Left atrial size was normal in size.  Right Atrium: Right atrial size was normal in size.  Pericardium: There is no evidence of pericardial effusion.  Mitral Valve: The mitral valve is normal in structure. Trivial mitral valve regurgitation. No evidence of mitral valve stenosis.  Tricuspid Valve: The tricuspid valve is normal in structure. Tricuspid valve regurgitation is trivial. No  evidence of tricuspid stenosis.  Aortic Valve: The aortic valve is tricuspid. Aortic valve regurgitation is trivial. No aortic stenosis is present. Aortic valve peak gradient measures 5.6 mmHg.  Pulmonic Valve: The pulmonic valve was normal in structure. Pulmonic valve regurgitation is trivial. No evidence of pulmonic stenosis.  Aorta: The aortic root is normal in size and structure.  Venous: The inferior vena cava is normal in size with greater than 50% respiratory variability, suggesting right atrial pressure of 3 mmHg.  IAS/Shunts: The interatrial septum was not well visualized.   LEFT VENTRICLE PLAX 2D LVIDd:         4.20 cm         Diastology LVIDs:         2.65 cm         LV e' medial:    10.80 cm/s LV PW:         0.60  cm         LV E/e' medial:  8.6 LV IVS:        0.80 cm         LV e' lateral:   15.90 cm/s LVOT diam:     2.00 cm         LV E/e' lateral: 5.9 LV SV:         52 LV SV Index:   28 LVOT Area:     3.14 cm        3D Volume EF LV 3D EF:    Left ventricul ar ejection fraction by 3D volume is 67 %.  3D Volume EF: 3D EF:        67 % LV EDV:       127 ml LV ESV:       42 ml LV SV:        85 ml  RIGHT VENTRICLE             IVC RV S prime:     10.40 cm/s  IVC diam: 1.50 cm TAPSE (M-mode): 1.8 cm  LEFT ATRIUM             Index        RIGHT ATRIUM          Index LA diam:        3.40 cm 1.84 cm/m   RA Area:     9.08 cm LA Vol (A2C):   43.0 ml 23.32 ml/m  RA Volume:   15.00 ml 8.13 ml/m LA Vol (A4C):   32.1 ml 17.41 ml/m LA Biplane Vol: 37.1 ml 20.12 ml/m AORTIC VALVE AV Area (Vmax): 2.38 cm AV Vmax:        118.50 cm/s AV Peak Grad:   5.6 mmHg LVOT Vmax:      89.60 cm/s LVOT Vmean:     55.500 cm/s LVOT VTI:       0.165 m  AORTA Ao Root diam: 3.10 cm Ao Asc diam:  3.40 cm  MITRAL VALVE               TRICUSPID VALVE MV Area (PHT): 4.89 cm    TR Peak grad:   21.0 mmHg MV Decel Time: 155 msec    TR Vmax:        229.00 cm/s MV E velocity: 93.30 cm/s MV A velocity: 82.30 cm/s  SHUNTS MV E/A ratio:  1.13        Systemic VTI:  0.16 m Systemic Diam: 2.00 cm  Soyla Merck MD Electronically signed by Soyla  Loni MD Signature Date/Time: 07/28/2023/5:22:02 PM    Final    MONITORS  CARDIAC EVENT MONITOR 02/02/2021  Narrative Sinus rhythm with sinus tachycardia No atrial fibrillation Rare ventricular ectopy  Will Camnitz, MD   CT SCANS  CT CORONARY MORPH W/CTA COR W/SCORE 08/25/2023  Addendum 09/02/2023 10:34 AM ADDENDUM REPORT: 09/02/2023 10:31  EXAM: OVER-READ INTERPRETATION  CT CHEST  The following report is an over-read performed by radiologist Dr. Andrea Gasman of [facility] on 09/02/2023. This over-read does not include  interpretation of cardiac or coronary anatomy or pathology. The coronary CTA interpretation by the cardiologist is attached.  COMPARISON:  Chest CTA 07/27/2023  FINDINGS: Vascular: No aortic atherosclerosis. The included aorta is normal in caliber.  Mediastinum/nodes: No adenopathy or mass. Unremarkable esophagus.  Lungs: No focal airspace disease. No pulmonary nodule. No pleural fluid. The included airways are patent.  Upper abdomen: No acute or unexpected findings.  Musculoskeletal: There are no acute or suspicious osseous abnormalities.  IMPRESSION: No acute or unexpected extracardiac findings.   Electronically Signed By: Andrea Gasman M.D. On: 09/02/2023 10:31  Narrative HISTORY: 48 yo female with precordial pain  EXAM: Cardiac/Coronary CTA  TECHNIQUE: The patient was scanned on a [employer].  PROTOCOL: A 100 kV prospective scan was triggered in the descending thoracic aorta at 111 HU's. Axial non-contrast 3 mm slices were carried out through the heart. The data set was analyzed on a dedicated work station and scored using the Agatson method. Gantry rotation speed was 250 msecs and collimation was .6 mm. Beta blockade and 0.8 mg of sl NTG was given. The 3D data set was reconstructed in 5% intervals of the 35-75 % of the R-R cycle. Diastolic phases were analyzed on a dedicated work station using MPR, MIP and VRT modes. The patient received 95mL OMNIPAQUE  IOHEXOL  350 MG/ML SOLN contrast.  FINDINGS: Quality: Good, HR 69  Coronary calcium  score: The patient's coronary artery calcium  score is 0.  Coronary arteries: Normal coronary origins.  Right dominance.  Right Coronary Artery: Dominant. No disease. Normal R-PLB and R-PDA branches.  Left Main Coronary Artery: Normal. Bifurcates into the LAD and LCx arteries.  Left Anterior Descending Coronary Artery: Large anterior artery that wraps around the apex. No disease. Larger D1 and smaller  D2 branches, no disease.  Left Circumflex Artery: AV groove vessel, no disease.  Aorta: Normal size, 35 mm at the mid ascending aorta (level of the PA bifurcation) measured double oblique. No calcifications. No dissection.  Aortic Valve: Trileaflet. No calcifications.  Other findings:  Normal pulmonary vein drainage into the left atrium.  Normal left atrial appendage without a thrombus.  Dilated main pulmonary artery to 33 mm, suggestive of pulmonary hypertension.  IMPRESSION: 1. No evidence of CAD, CADRADS = 0.  2. Coronary artery calcium  score is 0.  3. Normal coronary origin with right dominance.  4. Dilated main pulmonary artery to 33 mm, suggestive of pulmonary hypertension.  5. Consider non-coronary causes of chest pain.  Electronically Signed: By: Vinie JAYSON Maxcy M.D. On: 08/28/2023 09:35     ______________________________________________________________________________________________       Current Reported Medications:.    Current Meds  Medication Sig   amLODipine  (NORVASC ) 5 MG tablet Take 1 tablet (5 mg total) by mouth daily.   aspirin  EC 81 MG tablet Take 1 tablet (81 mg total) by mouth daily. Swallow whole.   atorvastatin  (LIPITOR) 40 MG tablet Take 1 tablet (40 mg total) by mouth at bedtime.   Cholecalciferol   50 MCG (2000 UT) TABS Take 1 tablet (2,000 Units total) by mouth daily.   Ferric Maltol  (ACCRUFER ) 30 MG CAPS Take 1 capsule (30 mg total) by mouth in the morning and at bedtime.   fluticasone  (FLONASE ) 50 MCG/ACT nasal spray Place 2 sprays into both nostrils daily.   levocetirizine (XYZAL ) 5 MG tablet Take 1 tablet (5 mg total) by mouth every evening.   Misc Natural Products (BEET ROOT) 500 MG CAPS Take 500 mg by mouth daily.   montelukast  (SINGULAIR ) 10 MG tablet Take 1 tablet (10 mg total) by mouth at bedtime.   nebivolol  (BYSTOLIC ) 10 MG tablet Take 1 tablet (10 mg total) by mouth daily.   pantoprazole  (PROTONIX ) 40 MG tablet Take 1 tablet  (40 mg total) by mouth 2 (two) times daily before a meal for 1 month.  Then reduce to 1 tablet (40mg ) by mouth once daily   thiamine  (VITAMIN B-1) 50 MG tablet Take 1 tablet by mouth daily.   tranexamic acid  (LYSTEDA ) 650 MG TABS tablet Take 2 tablets by mouth 3 times a day for 5 days.   Physical Exam:    VS:  BP 118/73 (BP Location: Left Arm, Patient Position: Sitting, Cuff Size: Normal)   Pulse 68   Ht 5' 4 (1.626 m)   Wt 176 lb (79.8 kg)   SpO2 99%   BMI 30.21 kg/m    Wt Readings from Last 3 Encounters:  06/07/24 176 lb (79.8 kg)  04/22/24 175 lb (79.4 kg)  04/04/24 174 lb (78.9 kg)    GEN: Well nourished, well developed in no acute distress NECK: No JVD; No carotid bruits CARDIAC: RRR, no murmurs, rubs, gallops RESPIRATORY:  Clear to auscultation without rales, wheezing or rhonchi  ABDOMEN: Soft, non-tender, non-distended EXTREMITIES:  No edema; No acute deformity     Asessement and Plan:.    Chest pain:  Patient presented to the ED on 07/27/2023 with an episode of upper back and chest pressure.  Her EKG was nonischemic, high sensitive troponin 16>>118>>125>>110. Coronary CTA on 08/25/2023 indicated a coronary calcium  score of 0, with no evidence of CAD. Stable with no anginal symptoms. No indication for ischemic evaluation.  Heart healthy diet and regular cardiovascular exercise encouraged.  Given coronary calcium  score of 0, will discontinue aspirin  81 mg daily.  Reviewed ED precautions.  Continue amlodipine  5 mg daily, Lipitor 40 mg daily, Nebivolol  10 mg daily.  Hypertension: Blood pressure today 118/73. Continue Nebivolol  10 mg daily.  Uterine fibroid: CTA previously showed a large 15.1 cm uterine mass originating from the uterine wall with central necrosis, suspected degenerating fibroid.  She is followed by GYN.  Hyperlipidemia: Last lipid profile in 10/18/2023 indicated total cholesterol 133, HDL 65, triglycerides 51 and LDL 56.  On atorvastatin  40 mg daily.    Disposition: F/u with Yajayra Feldt, NP in one year per patient request.   Signed, Sharan Mcenaney D Jarrod Mcenery, NP

## 2024-06-07 ENCOUNTER — Ambulatory Visit: Attending: Cardiology | Admitting: Cardiology

## 2024-06-07 ENCOUNTER — Encounter: Payer: Self-pay | Admitting: Cardiology

## 2024-06-07 VITALS — BP 118/73 | HR 68 | Ht 64.0 in | Wt 176.0 lb

## 2024-06-07 DIAGNOSIS — I10 Essential (primary) hypertension: Secondary | ICD-10-CM | POA: Diagnosis not present

## 2024-06-07 DIAGNOSIS — I700 Atherosclerosis of aorta: Secondary | ICD-10-CM

## 2024-06-07 DIAGNOSIS — R072 Precordial pain: Secondary | ICD-10-CM | POA: Diagnosis not present

## 2024-06-07 DIAGNOSIS — E782 Mixed hyperlipidemia: Secondary | ICD-10-CM | POA: Diagnosis not present

## 2024-06-07 NOTE — Patient Instructions (Signed)
 Medication Instructions:  Your physician has recommended you make the following change in your medication:  Stop taking Aspirin  Continue taking all other medications as prescribed   Labwork: None  Testing/Procedures: None  Follow-Up: Your physician recommends that you schedule a follow-up appointment in: 1 year  Any Other Special Instructions Will Be Listed Below (If Applicable). Thank you for choosing [facility]!     If you need a refill on your cardiac medications before your next appointment, please call your pharmacy.

## 2024-06-09 ENCOUNTER — Other Ambulatory Visit (HOSPITAL_COMMUNITY): Payer: Self-pay

## 2024-06-09 ENCOUNTER — Ambulatory Visit
Admission: RE | Admit: 2024-06-09 | Discharge: 2024-06-09 | Disposition: A | Discharge status: Home / Self Care | Source: Ambulatory Visit | Attending: Interventional Radiology | Admitting: Interventional Radiology

## 2024-06-09 DIAGNOSIS — D251 Intramural leiomyoma of uterus: Secondary | ICD-10-CM | POA: Diagnosis not present

## 2024-06-09 DIAGNOSIS — D259 Leiomyoma of uterus, unspecified: Secondary | ICD-10-CM

## 2024-06-09 MED ORDER — GADOPICLENOL 0.5 MMOL/ML IV SOLN
8.0000 mL | Freq: Once | INTRAVENOUS | Status: AC | PRN
Start: 2024-06-09 — End: 2024-06-09
  Administered 2024-06-09: 8 mL via INTRAVENOUS

## 2024-06-10 ENCOUNTER — Other Ambulatory Visit (HOSPITAL_COMMUNITY): Payer: Self-pay

## 2024-06-10 MED ORDER — TRANEXAMIC ACID 650 MG PO TABS
ORAL_TABLET | ORAL | 11 refills | Status: AC
  Filled 2024-06-10: qty 30, 5d supply, fill #0

## 2024-06-11 ENCOUNTER — Other Ambulatory Visit (HOSPITAL_COMMUNITY): Payer: Self-pay

## 2024-06-28 ENCOUNTER — Other Ambulatory Visit: Payer: Self-pay | Admitting: Internal Medicine

## 2024-06-28 ENCOUNTER — Other Ambulatory Visit (HOSPITAL_COMMUNITY): Payer: Self-pay

## 2024-06-28 DIAGNOSIS — I10 Essential (primary) hypertension: Secondary | ICD-10-CM

## 2024-06-28 MED ORDER — AMLODIPINE BESYLATE 5 MG PO TABS
5.0000 mg | ORAL_TABLET | Freq: Every day | ORAL | 1 refills | Status: AC
  Filled 2024-06-28: qty 90, 90d supply, fill #0

## 2024-08-07 ENCOUNTER — Other Ambulatory Visit (HOSPITAL_COMMUNITY): Payer: Self-pay

## 2024-08-07 MED ORDER — ACCRUFER 30 MG PO CAPS
1.0000 | ORAL_CAPSULE | Freq: Every morning | ORAL | 4 refills | Status: AC
  Filled 2024-08-07: qty 30, 30d supply, fill #0

## 2024-08-09 ENCOUNTER — Other Ambulatory Visit (HOSPITAL_COMMUNITY): Payer: Self-pay

## 2024-08-13 ENCOUNTER — Other Ambulatory Visit (HOSPITAL_COMMUNITY): Payer: Self-pay

## 2024-08-14 ENCOUNTER — Other Ambulatory Visit (HOSPITAL_COMMUNITY): Payer: Self-pay

## 2024-08-24 ENCOUNTER — Other Ambulatory Visit (HOSPITAL_COMMUNITY): Payer: Self-pay

## 2024-08-27 ENCOUNTER — Other Ambulatory Visit (HOSPITAL_COMMUNITY): Payer: Self-pay

## 2024-09-02 ENCOUNTER — Other Ambulatory Visit (HOSPITAL_COMMUNITY): Payer: Self-pay

## 2024-09-02 ENCOUNTER — Other Ambulatory Visit: Payer: Self-pay | Admitting: Internal Medicine

## 2024-09-02 ENCOUNTER — Other Ambulatory Visit: Payer: Self-pay

## 2024-09-02 DIAGNOSIS — I10 Essential (primary) hypertension: Secondary | ICD-10-CM

## 2024-09-02 DIAGNOSIS — R000 Tachycardia, unspecified: Secondary | ICD-10-CM

## 2024-09-03 ENCOUNTER — Other Ambulatory Visit (HOSPITAL_COMMUNITY): Payer: Self-pay

## 2024-09-03 ENCOUNTER — Other Ambulatory Visit: Payer: Self-pay

## 2024-09-03 MED ORDER — NEBIVOLOL HCL 10 MG PO TABS
10.0000 mg | ORAL_TABLET | Freq: Every day | ORAL | 1 refills | Status: AC
  Filled 2024-09-03: qty 90, 90d supply, fill #0

## 2024-10-10 ENCOUNTER — Ambulatory Visit: Admitting: Internal Medicine

## 2024-12-12 ENCOUNTER — Encounter: Admitting: Internal Medicine
